# Patient Record
Sex: Female | Born: 1935
Health system: Southern US, Community
[De-identification: ages and names within clinical notes are randomized; demographics above are authoritative.]

## PROBLEM LIST (undated history)

## (undated) DIAGNOSIS — R42 Dizziness and giddiness: Secondary | ICD-10-CM

## (undated) DIAGNOSIS — I10 Essential (primary) hypertension: Secondary | ICD-10-CM

## (undated) DIAGNOSIS — I639 Cerebral infarction, unspecified: Secondary | ICD-10-CM

## (undated) HISTORY — PX: PARTIAL HYSTERECTOMY: SHX80

## (undated) HISTORY — DX: Cerebral infarction, unspecified: I63.9

---

## 2011-10-15 DIAGNOSIS — Z79899 Other long term (current) drug therapy: Secondary | ICD-10-CM | POA: Diagnosis not present

## 2011-10-15 DIAGNOSIS — I10 Essential (primary) hypertension: Secondary | ICD-10-CM | POA: Diagnosis not present

## 2011-10-22 DIAGNOSIS — K219 Gastro-esophageal reflux disease without esophagitis: Secondary | ICD-10-CM | POA: Diagnosis not present

## 2011-10-22 DIAGNOSIS — M79609 Pain in unspecified limb: Secondary | ICD-10-CM | POA: Diagnosis not present

## 2011-10-22 DIAGNOSIS — I10 Essential (primary) hypertension: Secondary | ICD-10-CM | POA: Diagnosis not present

## 2011-10-22 DIAGNOSIS — J309 Allergic rhinitis, unspecified: Secondary | ICD-10-CM | POA: Diagnosis not present

## 2011-11-14 DIAGNOSIS — K921 Melena: Secondary | ICD-10-CM | POA: Diagnosis not present

## 2011-12-24 DIAGNOSIS — M899 Disorder of bone, unspecified: Secondary | ICD-10-CM | POA: Diagnosis not present

## 2011-12-24 DIAGNOSIS — I10 Essential (primary) hypertension: Secondary | ICD-10-CM | POA: Diagnosis not present

## 2011-12-24 DIAGNOSIS — M949 Disorder of cartilage, unspecified: Secondary | ICD-10-CM | POA: Diagnosis not present

## 2012-01-16 DIAGNOSIS — K449 Diaphragmatic hernia without obstruction or gangrene: Secondary | ICD-10-CM | POA: Diagnosis not present

## 2012-01-16 DIAGNOSIS — K219 Gastro-esophageal reflux disease without esophagitis: Secondary | ICD-10-CM | POA: Diagnosis not present

## 2012-01-16 DIAGNOSIS — R195 Other fecal abnormalities: Secondary | ICD-10-CM | POA: Diagnosis not present

## 2012-01-16 DIAGNOSIS — K5730 Diverticulosis of large intestine without perforation or abscess without bleeding: Secondary | ICD-10-CM | POA: Diagnosis not present

## 2012-01-29 DIAGNOSIS — I10 Essential (primary) hypertension: Secondary | ICD-10-CM | POA: Diagnosis not present

## 2012-02-05 DIAGNOSIS — H2510 Age-related nuclear cataract, unspecified eye: Secondary | ICD-10-CM | POA: Diagnosis not present

## 2012-02-12 DIAGNOSIS — K921 Melena: Secondary | ICD-10-CM | POA: Diagnosis not present

## 2012-04-22 DIAGNOSIS — I10 Essential (primary) hypertension: Secondary | ICD-10-CM | POA: Diagnosis not present

## 2012-04-28 DIAGNOSIS — R7309 Other abnormal glucose: Secondary | ICD-10-CM | POA: Diagnosis not present

## 2012-04-28 DIAGNOSIS — I10 Essential (primary) hypertension: Secondary | ICD-10-CM | POA: Diagnosis not present

## 2012-04-28 DIAGNOSIS — M79609 Pain in unspecified limb: Secondary | ICD-10-CM | POA: Diagnosis not present

## 2012-07-01 DIAGNOSIS — I10 Essential (primary) hypertension: Secondary | ICD-10-CM | POA: Diagnosis not present

## 2012-07-27 ENCOUNTER — Emergency Department (INDEPENDENT_AMBULATORY_CARE_PROVIDER_SITE_OTHER)
Admission: EM | Admit: 2012-07-27 | Discharge: 2012-07-27 | Disposition: A | Discharge status: Home / Self Care | Payer: Medicare Other | Source: Home / Self Care | Attending: Emergency Medicine | Admitting: Emergency Medicine

## 2012-07-27 ENCOUNTER — Encounter (HOSPITAL_COMMUNITY): Payer: Self-pay | Admitting: Emergency Medicine

## 2012-07-27 DIAGNOSIS — H8110 Benign paroxysmal vertigo, unspecified ear: Secondary | ICD-10-CM

## 2012-07-27 LAB — POCT I-STAT, CHEM 8
BUN: 19 mg/dL (ref 6–23)
Chloride: 106 mEq/L (ref 96–112)
Creatinine, Ser: 0.9 mg/dL (ref 0.50–1.10)
Potassium: 3.8 mEq/L (ref 3.5–5.1)
Sodium: 145 mEq/L (ref 135–145)

## 2012-07-27 MED ORDER — MECLIZINE HCL 12.5 MG PO TABS: 12.5000 mg | ORAL_TABLET | Freq: Three times a day (TID) | ORAL | Status: DC | PRN

## 2012-07-27 NOTE — ED Notes (Signed)
Patient complains of dizziness , no other sx, started this morning

## 2012-07-27 NOTE — ED Provider Notes (Signed)
Chief Complaint  Patient presents with  . Dizziness    History of Present Illness:  The patient is a 78 year old female who presents with a history of dizziness since getting up from bed this morning. The patient states she got up out of bed suddenly and abruptly and thereafter felt dizzy and off balance. She denies any whirling vertigo or presyncope. She states her legs felt weak, wobbly, and staggering when she tried to walk. The symptoms have gotten progressively better throughout the morning and now they're only minimal. She denies any headaches, diplopia, blurred vision, difficulty hearing, ringing in ears, nasal congestion, or rhinorrhea. She hasn't had a stiff neck, shortness of breath, chest pain, palpitations, or syncope. She denies any paresthesias, difficulty with speech, swallowing, or localized muscle weakness.  Review of Systems:  Other than noted above, the patient denies any of the following symptoms: Systemic:  No fever, chills, fatigue, or weight loss. Eye:  No blurred vision, visual change or diplopia. ENT:  No ear pain, tinnitus, hearing loss, nasal congestion, or rhinorrhea. Cardiac:  No chest pain, dyspnea, palpitations or syncope. Neuro:  No headache, paresthesias, weakness, trouble with speech, coordination or ambulation.  PMFSH:  Past medical history, family history, social history, meds, and allergies were reviewed.  Physical Exam:   Vital signs:  BP 167/97  Pulse 93  Temp 98.6 F (37 C) (Oral)  Resp 16  SpO2 98% Filed Vitals:   07/27/12 0829 07/27/12 0911 07/27/12 0926 07/27/12 0927  BP: 165/89 153/79 Supine  157/89 Sitting  167/97 Standing   Pulse: 91 83 85 93  Temp: 98.6 F (37 C)     TempSrc: Oral     Resp: 16     SpO2: 98%      General:  Alert, oriented times 3, in no distress. Eye:  PERRL, full EOM, no nystagmus. ENT:  TMs and canals normal.  Nasal mucosa normal.  Pharynx clear. Neck:  No adenopathy, tenderness, or mass.  Thyroid normal.  No  carotid bruit. Lungs:  Breath sounds clear and equal bilaterally.  No wheezes, rales or rhonchi. Heart:  Regular rhythm.  No gallops, murmers, or rubs. Neuro:  Alert and oriented times 3.  Cranial nerves intact.  No pronator drift.  Finger to nose normal.  No focal weakness.  Sensation intact to light touch.  Romberg's sign negative, gait normal.  She is unable to do tandem gait because of a knee problem.  Dix-Hallpike maneuver was positive with the right ear down with subjective mild dizziness but no nystagmus.  Labs:   Results for orders placed during the hospital encounter of 07/27/12  POCT I-STAT, CHEM 8      Component Value Range   Sodium 145  135 - 145 mEq/L   Potassium 3.8  3.5 - 5.1 mEq/L   Chloride 106  96 - 112 mEq/L   BUN 19  6 - 23 mg/dL   Creatinine, Ser 9.60  0.50 - 1.10 mg/dL   Glucose, Bld 454 (*) 70 - 99 mg/dL   Calcium, Ion 0.98  1.19 - 1.30 mmol/L   TCO2 25  0 - 100 mmol/L   Hemoglobin 13.6  12.0 - 15.0 g/dL   HCT 14.7  82.9 - 56.2 %     Date: 07/27/2012  Rate: 85  Rhythm: normal sinus rhythm and premature ventricular contractions (PVC)  QRS Axis: normal  Intervals: normal  ST/T Wave abnormalities: nonspecific T wave changes  Conduction Disutrbances:none  Narrative Interpretation: Sinus rhythm with occasional premature  ventricular complexes, possible left atrial margin and, nonspecific T wave abnormalities.  Old EKG Reviewed: none available  Assessment:  The encounter diagnosis was Benign positional vertigo.  Plan:   1.  The following meds were prescribed:   New Prescriptions   MECLIZINE (ANTIVERT) 12.5 MG TABLET    Take 1 tablet (12.5 mg total) by mouth 3 (three) times daily as needed.   2.  The patient was instructed in symptomatic care and handouts were given. She was given apple he exercises to do at home and suggested she return in a week if no improvement either here or to her primary care physician. 3.  The patient was told to return if becoming worse  in any way, if no better in 3 or 4 days, and given some red flag symptoms that would indicate earlier return.    Reuben Likes, MD 07/27/12 [phone]

## 2012-08-04 DIAGNOSIS — T887XXA Unspecified adverse effect of drug or medicament, initial encounter: Secondary | ICD-10-CM | POA: Diagnosis not present

## 2012-08-04 DIAGNOSIS — I10 Essential (primary) hypertension: Secondary | ICD-10-CM | POA: Diagnosis not present

## 2012-08-21 DIAGNOSIS — M7750 Other enthesopathy of unspecified foot: Secondary | ICD-10-CM | POA: Diagnosis not present

## 2012-08-21 DIAGNOSIS — M7150 Other bursitis, not elsewhere classified, unspecified site: Secondary | ICD-10-CM | POA: Diagnosis not present

## 2012-09-15 DIAGNOSIS — M1710 Unilateral primary osteoarthritis, unspecified knee: Secondary | ICD-10-CM | POA: Diagnosis not present

## 2012-10-20 DIAGNOSIS — I10 Essential (primary) hypertension: Secondary | ICD-10-CM | POA: Diagnosis not present

## 2012-10-20 DIAGNOSIS — E739 Lactose intolerance, unspecified: Secondary | ICD-10-CM | POA: Diagnosis not present

## 2012-10-20 DIAGNOSIS — Z79899 Other long term (current) drug therapy: Secondary | ICD-10-CM | POA: Diagnosis not present

## 2012-10-27 DIAGNOSIS — K219 Gastro-esophageal reflux disease without esophagitis: Secondary | ICD-10-CM | POA: Diagnosis not present

## 2012-10-27 DIAGNOSIS — M899 Disorder of bone, unspecified: Secondary | ICD-10-CM | POA: Diagnosis not present

## 2012-10-27 DIAGNOSIS — I10 Essential (primary) hypertension: Secondary | ICD-10-CM | POA: Diagnosis not present

## 2012-10-27 DIAGNOSIS — I499 Cardiac arrhythmia, unspecified: Secondary | ICD-10-CM | POA: Diagnosis not present

## 2012-10-27 DIAGNOSIS — M949 Disorder of cartilage, unspecified: Secondary | ICD-10-CM | POA: Diagnosis not present

## 2012-10-27 DIAGNOSIS — J438 Other emphysema: Secondary | ICD-10-CM | POA: Diagnosis not present

## 2012-11-02 DIAGNOSIS — R0602 Shortness of breath: Secondary | ICD-10-CM | POA: Diagnosis not present

## 2012-11-02 DIAGNOSIS — R05 Cough: Secondary | ICD-10-CM | POA: Diagnosis not present

## 2012-11-02 DIAGNOSIS — R0989 Other specified symptoms and signs involving the circulatory and respiratory systems: Secondary | ICD-10-CM | POA: Diagnosis not present

## 2012-11-02 DIAGNOSIS — R0609 Other forms of dyspnea: Secondary | ICD-10-CM | POA: Diagnosis not present

## 2012-11-16 DIAGNOSIS — M1710 Unilateral primary osteoarthritis, unspecified knee: Secondary | ICD-10-CM | POA: Diagnosis not present

## 2013-02-09 DIAGNOSIS — I10 Essential (primary) hypertension: Secondary | ICD-10-CM | POA: Diagnosis not present

## 2013-02-25 DIAGNOSIS — H905 Unspecified sensorineural hearing loss: Secondary | ICD-10-CM | POA: Diagnosis not present

## 2013-03-09 DIAGNOSIS — J322 Chronic ethmoidal sinusitis: Secondary | ICD-10-CM | POA: Diagnosis not present

## 2013-04-20 DIAGNOSIS — I10 Essential (primary) hypertension: Secondary | ICD-10-CM | POA: Diagnosis not present

## 2013-04-27 DIAGNOSIS — I10 Essential (primary) hypertension: Secondary | ICD-10-CM | POA: Diagnosis not present

## 2013-04-27 DIAGNOSIS — K219 Gastro-esophageal reflux disease without esophagitis: Secondary | ICD-10-CM | POA: Diagnosis not present

## 2013-05-24 DIAGNOSIS — H60399 Other infective otitis externa, unspecified ear: Secondary | ICD-10-CM | POA: Diagnosis not present

## 2013-05-27 DIAGNOSIS — H60399 Other infective otitis externa, unspecified ear: Secondary | ICD-10-CM | POA: Diagnosis not present

## 2013-07-13 DIAGNOSIS — H2510 Age-related nuclear cataract, unspecified eye: Secondary | ICD-10-CM | POA: Diagnosis not present

## 2013-10-22 DIAGNOSIS — I10 Essential (primary) hypertension: Secondary | ICD-10-CM | POA: Diagnosis not present

## 2013-10-22 DIAGNOSIS — Z79899 Other long term (current) drug therapy: Secondary | ICD-10-CM | POA: Diagnosis not present

## 2013-10-29 DIAGNOSIS — I10 Essential (primary) hypertension: Secondary | ICD-10-CM | POA: Diagnosis not present

## 2013-10-29 DIAGNOSIS — E559 Vitamin D deficiency, unspecified: Secondary | ICD-10-CM | POA: Diagnosis not present

## 2013-10-29 DIAGNOSIS — I499 Cardiac arrhythmia, unspecified: Secondary | ICD-10-CM | POA: Diagnosis not present

## 2013-10-29 DIAGNOSIS — R030 Elevated blood-pressure reading, without diagnosis of hypertension: Secondary | ICD-10-CM | POA: Diagnosis not present

## 2014-03-15 DIAGNOSIS — E559 Vitamin D deficiency, unspecified: Secondary | ICD-10-CM | POA: Diagnosis not present

## 2014-03-22 DIAGNOSIS — I10 Essential (primary) hypertension: Secondary | ICD-10-CM | POA: Diagnosis not present

## 2014-05-17 DIAGNOSIS — H6120 Impacted cerumen, unspecified ear: Secondary | ICD-10-CM | POA: Diagnosis not present

## 2014-06-08 DIAGNOSIS — H7290 Unspecified perforation of tympanic membrane, unspecified ear: Secondary | ICD-10-CM | POA: Diagnosis not present

## 2014-06-08 DIAGNOSIS — J040 Acute laryngitis: Secondary | ICD-10-CM | POA: Diagnosis not present

## 2014-06-08 DIAGNOSIS — H60399 Other infective otitis externa, unspecified ear: Secondary | ICD-10-CM | POA: Diagnosis not present

## 2014-09-26 DIAGNOSIS — E559 Vitamin D deficiency, unspecified: Secondary | ICD-10-CM | POA: Diagnosis not present

## 2014-09-26 DIAGNOSIS — I10 Essential (primary) hypertension: Secondary | ICD-10-CM | POA: Diagnosis not present

## 2014-09-26 DIAGNOSIS — Z79899 Other long term (current) drug therapy: Secondary | ICD-10-CM | POA: Diagnosis not present

## 2014-10-03 DIAGNOSIS — Z79899 Other long term (current) drug therapy: Secondary | ICD-10-CM | POA: Diagnosis not present

## 2014-10-03 DIAGNOSIS — I10 Essential (primary) hypertension: Secondary | ICD-10-CM | POA: Diagnosis not present

## 2014-10-03 DIAGNOSIS — E559 Vitamin D deficiency, unspecified: Secondary | ICD-10-CM | POA: Diagnosis not present

## 2014-10-17 DIAGNOSIS — R12 Heartburn: Secondary | ICD-10-CM | POA: Diagnosis not present

## 2015-01-11 DIAGNOSIS — M1711 Unilateral primary osteoarthritis, right knee: Secondary | ICD-10-CM | POA: Diagnosis not present

## 2015-01-24 DIAGNOSIS — M1711 Unilateral primary osteoarthritis, right knee: Secondary | ICD-10-CM | POA: Diagnosis not present

## 2015-01-31 DIAGNOSIS — M1711 Unilateral primary osteoarthritis, right knee: Secondary | ICD-10-CM | POA: Diagnosis not present

## 2015-02-07 DIAGNOSIS — M1711 Unilateral primary osteoarthritis, right knee: Secondary | ICD-10-CM | POA: Diagnosis not present

## 2015-02-14 DIAGNOSIS — M1711 Unilateral primary osteoarthritis, right knee: Secondary | ICD-10-CM | POA: Diagnosis not present

## 2015-02-24 DIAGNOSIS — M1612 Unilateral primary osteoarthritis, left hip: Secondary | ICD-10-CM | POA: Diagnosis not present

## 2015-04-04 DIAGNOSIS — Z79899 Other long term (current) drug therapy: Secondary | ICD-10-CM | POA: Diagnosis not present

## 2015-04-04 DIAGNOSIS — E559 Vitamin D deficiency, unspecified: Secondary | ICD-10-CM | POA: Diagnosis not present

## 2015-04-11 DIAGNOSIS — M25569 Pain in unspecified knee: Secondary | ICD-10-CM | POA: Diagnosis not present

## 2015-04-11 DIAGNOSIS — E559 Vitamin D deficiency, unspecified: Secondary | ICD-10-CM | POA: Diagnosis not present

## 2015-04-11 DIAGNOSIS — I10 Essential (primary) hypertension: Secondary | ICD-10-CM | POA: Diagnosis not present

## 2015-06-02 DIAGNOSIS — H2513 Age-related nuclear cataract, bilateral: Secondary | ICD-10-CM | POA: Diagnosis not present

## 2015-08-08 DIAGNOSIS — E559 Vitamin D deficiency, unspecified: Secondary | ICD-10-CM | POA: Diagnosis not present

## 2015-08-15 DIAGNOSIS — M25569 Pain in unspecified knee: Secondary | ICD-10-CM | POA: Diagnosis not present

## 2015-08-15 DIAGNOSIS — E559 Vitamin D deficiency, unspecified: Secondary | ICD-10-CM | POA: Diagnosis not present

## 2015-08-15 DIAGNOSIS — I10 Essential (primary) hypertension: Secondary | ICD-10-CM | POA: Diagnosis not present

## 2015-10-18 DIAGNOSIS — Z0000 Encounter for general adult medical examination without abnormal findings: Secondary | ICD-10-CM | POA: Diagnosis not present

## 2015-10-18 DIAGNOSIS — I10 Essential (primary) hypertension: Secondary | ICD-10-CM | POA: Diagnosis not present

## 2015-10-18 DIAGNOSIS — Z79899 Other long term (current) drug therapy: Secondary | ICD-10-CM | POA: Diagnosis not present

## 2015-10-18 DIAGNOSIS — E559 Vitamin D deficiency, unspecified: Secondary | ICD-10-CM | POA: Diagnosis not present

## 2015-10-18 DIAGNOSIS — N390 Urinary tract infection, site not specified: Secondary | ICD-10-CM | POA: Diagnosis not present

## 2015-11-01 DIAGNOSIS — I10 Essential (primary) hypertension: Secondary | ICD-10-CM | POA: Diagnosis not present

## 2015-11-01 DIAGNOSIS — E559 Vitamin D deficiency, unspecified: Secondary | ICD-10-CM | POA: Diagnosis not present

## 2016-02-08 DIAGNOSIS — H2513 Age-related nuclear cataract, bilateral: Secondary | ICD-10-CM | POA: Diagnosis not present

## 2016-03-04 DIAGNOSIS — H2512 Age-related nuclear cataract, left eye: Secondary | ICD-10-CM | POA: Diagnosis not present

## 2016-03-04 DIAGNOSIS — H25811 Combined forms of age-related cataract, right eye: Secondary | ICD-10-CM | POA: Diagnosis not present

## 2016-04-23 DIAGNOSIS — I10 Essential (primary) hypertension: Secondary | ICD-10-CM | POA: Diagnosis not present

## 2016-04-23 DIAGNOSIS — M1990 Unspecified osteoarthritis, unspecified site: Secondary | ICD-10-CM | POA: Diagnosis not present

## 2016-04-23 DIAGNOSIS — R42 Dizziness and giddiness: Secondary | ICD-10-CM | POA: Diagnosis not present

## 2016-04-29 DIAGNOSIS — I10 Essential (primary) hypertension: Secondary | ICD-10-CM | POA: Diagnosis not present

## 2016-04-29 DIAGNOSIS — E559 Vitamin D deficiency, unspecified: Secondary | ICD-10-CM | POA: Diagnosis not present

## 2016-05-01 DIAGNOSIS — E559 Vitamin D deficiency, unspecified: Secondary | ICD-10-CM | POA: Diagnosis not present

## 2016-05-01 DIAGNOSIS — N3281 Overactive bladder: Secondary | ICD-10-CM | POA: Diagnosis not present

## 2016-06-15 ENCOUNTER — Encounter (HOSPITAL_COMMUNITY): Payer: Self-pay | Admitting: Emergency Medicine

## 2016-06-15 ENCOUNTER — Ambulatory Visit (HOSPITAL_COMMUNITY)
Admission: EM | Admit: 2016-06-15 | Discharge: 2016-06-15 | Disposition: A | Discharge status: Home / Self Care | Payer: Medicare Other | Attending: Family Medicine | Admitting: Family Medicine

## 2016-06-15 DIAGNOSIS — H65192 Other acute nonsuppurative otitis media, left ear: Secondary | ICD-10-CM

## 2016-06-15 DIAGNOSIS — R42 Dizziness and giddiness: Secondary | ICD-10-CM | POA: Diagnosis not present

## 2016-06-15 HISTORY — DX: Essential (primary) hypertension: I10

## 2016-06-15 MED ORDER — FLUTICASONE PROPIONATE 50 MCG/ACT NA SUSP: 2.0000 | Freq: Every day | NASAL | 2 refills | Status: DC

## 2016-06-15 NOTE — ED Notes (Signed)
Pt d/c by Jennifer O, NP  

## 2016-06-15 NOTE — ED Triage Notes (Signed)
Pt c/o intermittent dizziness since 8/14 associated w/left ear fullness and congestion  Voices no other concerns... A&O x4... NAD... Daughter at bedside.

## 2016-06-15 NOTE — ED Provider Notes (Signed)
CSN: [id number]     Arrival date & time 06/15/16  1310 History   First MD Initiated Contact with Patient 06/15/16 1508     Chief Complaint  Patient presents with  . Dizziness   (Consider location/radiation/quality/duration/timing/severity/associated sxs/prior Treatment) 82 y.o. female presents with intermittent dizziness  X several months and pressure to her left ear X 1 month. Patient states that she was diagnosed with vertigo back in July but has not taken meclizine because it will make her tired.  Condition is acute on chronic in nature. Condition is made better by nothing. Condition is made worse by laying down and with sudden movements. Patient denies any relieve from saline mist prior to there arrival at this facility. Upon review of notes patient has a diagnosis of benign postural vertigo in october 2013. No neurological deficits noted. No recent uri symptoms.       Past Medical History:  Diagnosis Date  . Hypertension    History reviewed. No pertinent surgical history. History reviewed. No pertinent family history. Social History  Substance Use Topics  . Smoking status: Never Smoker  . Smokeless tobacco: Never Used  . Alcohol use No   OB History    No data available     Review of Systems  Constitutional: Negative.   HENT: Positive for ear pain ( fullness to left ear).   Respiratory: Negative.   Neurological: Positive for dizziness.    Allergies  Review of patient's allergies indicates no known allergies.  Home Medications   Prior to Admission medications   Medication Sig Start Date End Date Taking? Authorizing Provider  amLODipine-benazepril (LOTREL) 5-40 MG per capsule Take 1 capsule by mouth daily.   Yes Historical Provider, MD  metoprolol succinate (TOPROL-XL) 50 MG 24 hr tablet Take 50 mg by mouth daily. Take with or immediately following a meal.   Yes Historical Provider, MD  calcium carbonate (OS-CAL) 600 MG TABS Take 600 mg by mouth daily.    Historical  Provider, MD  fluticasone (FLONASE) 50 MCG/ACT nasal spray Place 2 sprays into both nostrils daily. 06/15/16   Alene Mires, NP  Loratadine (CLARITIN PO) Take by mouth as needed.    Historical Provider, MD  meclizine (ANTIVERT) 12.5 MG tablet Take 1 tablet (12.5 mg total) by mouth 3 (three) times daily as needed. 07/27/12   Reuben Likes, MD   Meds Ordered and Administered this Visit  Medications - No data to display  BP 173/68 (BP Location: Left Arm)   Pulse 74   Temp 97.7 F (36.5 C) (Oral)   Resp 14   SpO2 99%  No data found.   Physical Exam  Constitutional: She is oriented to person, place, and time. She appears well-developed and well-nourished.  HENT:  Right Ear: External ear normal.  Left Ear: External ear normal.  Nose: Nose normal.  Cardiovascular: Normal rate and regular rhythm.   Pulmonary/Chest: Effort normal and breath sounds normal.  Neurological: She is alert and oriented to person, place, and time.  Skin: Skin is warm and dry.    Urgent Care Course   Clinical Course    Procedures (including critical care time)  Labs Review Labs Reviewed - No data to display  Imaging Review No results found.   Visual Acuity Review  Right Eye Distance:   Left Eye Distance:   Bilateral Distance:    Right Eye Near:   Left Eye Near:    Bilateral Near:         MDM  1. Vertigo   2. Acute effusion of left ear        Jacqualine Mau, NP 06/15/16 1516

## 2016-06-15 NOTE — Discharge Instructions (Signed)
Take vertigo as prescribed

## 2016-08-28 ENCOUNTER — Ambulatory Visit (INDEPENDENT_AMBULATORY_CARE_PROVIDER_SITE_OTHER): Payer: Medicare Other

## 2016-08-28 ENCOUNTER — Ambulatory Visit (INDEPENDENT_AMBULATORY_CARE_PROVIDER_SITE_OTHER): Payer: Medicare Other | Admitting: Podiatry

## 2016-08-28 ENCOUNTER — Encounter: Payer: Self-pay | Admitting: Podiatry

## 2016-08-28 DIAGNOSIS — M722 Plantar fascial fibromatosis: Secondary | ICD-10-CM | POA: Diagnosis not present

## 2016-08-28 DIAGNOSIS — M79671 Pain in right foot: Secondary | ICD-10-CM | POA: Diagnosis not present

## 2016-08-28 DIAGNOSIS — M21619 Bunion of unspecified foot: Secondary | ICD-10-CM

## 2016-08-28 MED ORDER — TRIAMCINOLONE ACETONIDE 10 MG/ML IJ SUSP
10.0000 mg | Freq: Once | INTRAMUSCULAR | Status: AC
  Administered 2016-08-28: 10 mg

## 2016-08-28 NOTE — Progress Notes (Signed)
   Subjective:    Patient ID: Danielle Vaughan, female    DOB: [birth year], 82 y.o.   MRN: [id number]  HPI Chief Complaint  Patient presents with  . Foot Pain    Right foot; medial (arch area); x2 weeks      Review of Systems  All other systems reviewed and are negative.      Objective:   Physical Exam        Assessment & Plan:

## 2016-08-29 NOTE — Progress Notes (Signed)
Subjective:     Patient ID: Danielle Vaughan, female   DOB: 23-Nov-1932, 82 y.o.   MRN: [id number]  HPI patient presents right foot stating that it's been hurting a lot and the arch and she does not remember specific injury and has been present around a month   Review of Systems  All other systems reviewed and are negative.      Objective:   Physical Exam  Constitutional: She is oriented to person, place, and time.  Cardiovascular: Intact distal pulses.   Musculoskeletal: Normal range of motion.  Neurological: She is oriented to person, place, and time.  Skin: Skin is warm.  Nursing note and vitals reviewed.  neurovascular status intact muscle strength is adequate range of motion within normal limits with patient's right arch being quite sore in the mid arch area with inflammation fluid buildup noted. Patient is moderate depression of the arch but no indication of tendon injury or other pathology. Found to have good digital perfusion and well oriented 3     Assessment:     Inflammatory fasciitis mid arch area right    Plan:     H&P condition reviewed and at this time reviewed x-rays. I did careful injection of the mid arch area right 3 mg Kenalog 5 mill grams Xylocaine and applied fascial brace to lift the arch. Reappoint to recheck again in the next several weeks  X-ray report indicates moderate depression of the arch with no indication of fracture or calcification

## 2016-10-29 DIAGNOSIS — Z0000 Encounter for general adult medical examination without abnormal findings: Secondary | ICD-10-CM | POA: Diagnosis not present

## 2016-10-29 DIAGNOSIS — Z13220 Encounter for screening for lipoid disorders: Secondary | ICD-10-CM | POA: Diagnosis not present

## 2016-10-29 DIAGNOSIS — E559 Vitamin D deficiency, unspecified: Secondary | ICD-10-CM | POA: Diagnosis not present

## 2016-10-29 DIAGNOSIS — I10 Essential (primary) hypertension: Secondary | ICD-10-CM | POA: Diagnosis not present

## 2016-11-05 DIAGNOSIS — I10 Essential (primary) hypertension: Secondary | ICD-10-CM | POA: Diagnosis not present

## 2016-11-05 DIAGNOSIS — Z79899 Other long term (current) drug therapy: Secondary | ICD-10-CM | POA: Diagnosis not present

## 2016-11-05 DIAGNOSIS — I517 Cardiomegaly: Secondary | ICD-10-CM | POA: Diagnosis not present

## 2016-11-05 DIAGNOSIS — E559 Vitamin D deficiency, unspecified: Secondary | ICD-10-CM | POA: Diagnosis not present

## 2016-11-14 DIAGNOSIS — H35362 Drusen (degenerative) of macula, left eye: Secondary | ICD-10-CM | POA: Diagnosis not present

## 2016-11-14 DIAGNOSIS — H35032 Hypertensive retinopathy, left eye: Secondary | ICD-10-CM | POA: Diagnosis not present

## 2016-11-14 DIAGNOSIS — H2512 Age-related nuclear cataract, left eye: Secondary | ICD-10-CM | POA: Diagnosis not present

## 2016-11-14 DIAGNOSIS — H25013 Cortical age-related cataract, bilateral: Secondary | ICD-10-CM | POA: Diagnosis not present

## 2016-11-14 DIAGNOSIS — H2513 Age-related nuclear cataract, bilateral: Secondary | ICD-10-CM | POA: Diagnosis not present

## 2016-12-03 DIAGNOSIS — H2512 Age-related nuclear cataract, left eye: Secondary | ICD-10-CM | POA: Diagnosis not present

## 2016-12-03 DIAGNOSIS — H25812 Combined forms of age-related cataract, left eye: Secondary | ICD-10-CM | POA: Diagnosis not present

## 2016-12-30 DIAGNOSIS — H2511 Age-related nuclear cataract, right eye: Secondary | ICD-10-CM | POA: Diagnosis not present

## 2016-12-30 DIAGNOSIS — H25011 Cortical age-related cataract, right eye: Secondary | ICD-10-CM | POA: Diagnosis not present

## 2016-12-30 DIAGNOSIS — H2589 Other age-related cataract: Secondary | ICD-10-CM | POA: Diagnosis not present

## 2017-01-07 DIAGNOSIS — H2511 Age-related nuclear cataract, right eye: Secondary | ICD-10-CM | POA: Diagnosis not present

## 2017-01-07 DIAGNOSIS — H25811 Combined forms of age-related cataract, right eye: Secondary | ICD-10-CM | POA: Diagnosis not present

## 2017-01-07 DIAGNOSIS — H2521 Age-related cataract, morgagnian type, right eye: Secondary | ICD-10-CM | POA: Diagnosis not present

## 2017-01-08 DIAGNOSIS — I10 Essential (primary) hypertension: Secondary | ICD-10-CM | POA: Diagnosis not present

## 2017-01-16 DIAGNOSIS — I10 Essential (primary) hypertension: Secondary | ICD-10-CM | POA: Diagnosis not present

## 2017-05-07 DIAGNOSIS — I10 Essential (primary) hypertension: Secondary | ICD-10-CM | POA: Diagnosis not present

## 2017-05-07 DIAGNOSIS — R5383 Other fatigue: Secondary | ICD-10-CM | POA: Diagnosis not present

## 2017-06-07 ENCOUNTER — Emergency Department (HOSPITAL_COMMUNITY)
Admission: EM | Admit: 2017-06-07 | Discharge: 2017-06-07 | Disposition: A | Discharge status: Home / Self Care | Payer: Medicare Other | Attending: Emergency Medicine | Admitting: Emergency Medicine

## 2017-06-07 ENCOUNTER — Encounter (HOSPITAL_COMMUNITY): Payer: Self-pay

## 2017-06-07 DIAGNOSIS — I10 Essential (primary) hypertension: Secondary | ICD-10-CM | POA: Insufficient documentation

## 2017-06-07 DIAGNOSIS — R55 Syncope and collapse: Secondary | ICD-10-CM | POA: Insufficient documentation

## 2017-06-07 DIAGNOSIS — Z79899 Other long term (current) drug therapy: Secondary | ICD-10-CM | POA: Insufficient documentation

## 2017-06-07 DIAGNOSIS — R000 Tachycardia, unspecified: Secondary | ICD-10-CM | POA: Diagnosis not present

## 2017-06-07 LAB — CBC
HCT: 35.5 % — ABNORMAL LOW (ref 36.0–46.0)
Hemoglobin: 12.1 g/dL (ref 12.0–15.0)
MCH: 30.9 pg (ref 26.0–34.0)
MCHC: 34.1 g/dL (ref 30.0–36.0)
MCV: 90.6 fL (ref 78.0–100.0)
Platelets: 373 10*3/uL (ref 150–400)
RBC: 3.92 MIL/uL (ref 3.87–5.11)
RDW: 17.7 % — ABNORMAL HIGH (ref 11.5–15.5)
WBC: 7 10*3/uL (ref 4.0–10.5)

## 2017-06-07 LAB — BASIC METABOLIC PANEL
Anion gap: 12 (ref 5–15)
BUN: 18 mg/dL (ref 6–20)
CO2: 21 mmol/L — ABNORMAL LOW (ref 22–32)
Calcium: 8.9 mg/dL (ref 8.9–10.3)
Chloride: 107 mmol/L (ref 101–111)
Creatinine, Ser: 0.94 mg/dL (ref 0.44–1.00)
GFR calc Af Amer: >60 mL/min (ref >60)
GFR calc non Af Amer: 55 mL/min — ABNORMAL LOW (ref >60)
Glucose, Bld: 97 mg/dL (ref 65–99)
Potassium: 3.4 mmol/L — ABNORMAL LOW (ref 3.5–5.1)
Sodium: 140 mmol/L (ref 135–145)

## 2017-06-07 LAB — CBG MONITORING, ED: Glucose-Capillary: 96 mg/dL (ref 65–99)

## 2017-06-07 NOTE — ED Notes (Signed)
Pt CBG was 96, notified Carla(RN)

## 2017-06-07 NOTE — ED Provider Notes (Signed)
[facility] DEPT Provider Note   CSN: [id number] Arrival date & time: 06/07/17  1351     History   Chief Complaint Chief Complaint  Patient presents with  . Loss of Consciousness    HPI Danielle Vaughan is a 83 y.o. female who presents with a syncopal episode. PMH significant for HTN. She states that she was in her usual state of health today and was getting her hair cut by a friend when she lost consciousness. The friend states that the patient suddenly became unresponsive and her eyes rolled to the back of her head. She was unresponsive for about 10 minutes. When she regained consciousness she was slow to respond but not confused and able to answer EMS questions. She denies feeling lightheaded throughout the day but endorses decreased PO intake today because she was busy cleaning. She denies recent fever or illness, headache, chest pain, palpitations, leg swelling, SOB, cough, abdominal pain, N/V/D, dysuria, unilateral weakness. She denies a cardiac hx and has not had any stress testing, echo, cath. No tongue biting or bladder incontinence.  HPI  Past Medical History:  Diagnosis Date  . Hypertension     There are no active problems to display for this patient.   History reviewed. No pertinent surgical history.  OB History    No data available       Home Medications    Prior to Admission medications   Medication Sig Start Date End Date Taking? Authorizing Provider  amLODipine (NORVASC) 5 MG tablet Take 5 mg by mouth at bedtime.    Yes [provider]  benazepril (LOTENSIN) 40 MG tablet Take 40 mg by mouth at bedtime.    Yes [provider]  Cholecalciferol (VITAMIN D3 PO) Take 1,000 Units by mouth every Sunday.    Yes [provider]  metoprolol succinate (TOPROL-XL) 50 MG 24 hr tablet Take 50 mg by mouth daily. Take with or immediately following a meal.   Yes [provider]  fluticasone (FLONASE) 50 MCG/ACT nasal spray Place 2  sprays into both nostrils daily. Patient not taking: Reported on 06/07/2017 06/15/16   Jacqualine Mau, NP  meclizine (ANTIVERT) 12.5 MG tablet Take 1 tablet (12.5 mg total) by mouth 3 (three) times daily as needed. Patient taking differently: Take 12.5 mg by mouth 3 (three) times daily as needed for dizziness.  07/27/12   Harden Mo, MD    Family History No family history on file.  Social History Social History  Substance Use Topics  . Smoking status: Never Smoker  . Smokeless tobacco: Never Used  . Alcohol use No     Allergies   Ceftin [cefuroxime axetil] and Codeine   Review of Systems Review of Systems  Constitutional: Negative for chills and fever.  Respiratory: Negative for cough and shortness of breath.   Cardiovascular: Negative for chest pain.  Gastrointestinal: Negative for abdominal pain, nausea and vomiting.  Genitourinary: Negative for dysuria.  Musculoskeletal: Positive for arthralgias (chronic knee pain).  Skin: Negative for wound.  Neurological: Positive for syncope. Negative for dizziness, seizures, weakness, light-headedness and headaches.  All other systems reviewed and are negative.    Physical Exam Updated Vital Signs BP 137/76 (BP Location: Right Arm)   Pulse 84   Temp 97.6 F (36.4 C) (Oral)   Resp 12   SpO2 99%   Physical Exam  Constitutional: She is oriented to person, place, and time. She appears well-developed and well-nourished. No distress.  Pleasant elderly female in NAD  HENT:  Head: Normocephalic and atraumatic.  Eyes: Pupils are equal, round, and reactive to light. Conjunctivae are normal. Right eye exhibits no discharge. Left eye exhibits no discharge. No scleral icterus.  Corneal arcus bilatarally  Neck: Normal range of motion.  Cardiovascular: Normal rate and regular rhythm.  Exam reveals no gallop and no friction rub.   No murmur heard. Pulmonary/Chest: Effort normal and breath sounds normal. No respiratory distress.  She has no wheezes. She has no rales. She exhibits no tenderness.  Abdominal: Soft. Bowel sounds are normal. She exhibits no distension and no mass. There is no tenderness. There is no rebound and no guarding.  Musculoskeletal:  No leg edema  Neurological: She is alert and oriented to person, place, and time.  Skin: Skin is warm and dry.  Psychiatric: She has a normal mood and affect. Her behavior is normal.  Nursing note and vitals reviewed.    ED Treatments / Results  Labs (all labs ordered are listed, but only abnormal results are displayed) Labs Reviewed  BASIC METABOLIC PANEL - Abnormal; Notable for the following:       Result Value   Potassium 3.4 (*)    CO2 21 (*)    GFR calc non Af Amer 55 (*)    All other components within normal limits  CBC - Abnormal; Notable for the following:    HCT 35.5 (*)    RDW 17.7 (*)    All other components within normal limits  CBG MONITORING, ED    EKG  EKG Interpretation  Date/Time:  Saturday June 07 2017 14:03:47 EDT Ventricular Rate:  85 PR Interval:    QRS Duration: 92 QT Interval:  401 QTC Calculation: 477 R Axis:   14 Text Interpretation:  Sinus rhythm Confirmed by Virgel Manifold [phone]) on 06/07/2017 3:09:16 PM       Radiology No results found.  Procedures Procedures (including critical care time)  Medications Ordered in ED Medications - No data to display   Initial Impression / Assessment and Plan / ED Course  I have reviewed the triage vital signs and the nursing notes.  Pertinent labs & imaging results that were available during my care of the patient were reviewed by me and considered in my medical decision making (see chart for details).  83 year old female with syncopal episode. Unclear etiology. She is mildly hypertensive but otherwise vitals are normal. EKG is NSR. Labs are overall unremarkable. She feels back to baseline. Shared decision making was made with patient and Dr. Wilson Singer. She feels comfortable  going home and following closely with her doctor. She was advised to drink more fluids. Return precautions given.  Final Clinical Impressions(s) / ED Diagnoses   Final diagnoses:  Syncope, unspecified syncope type    New Prescriptions New Prescriptions   No medications on file     Iris Pert 06/08/17 6283    Virgel Manifold, MD 06/24/17 1451

## 2017-06-07 NOTE — ED Triage Notes (Signed)
Patient arrived by [facility] following witnessed syncopal event while sitting in chair getting hair trimmed. Patient denies pain, alert and oriented on arrival. States that she has eaten and drank very little this am. IV established pta.

## 2017-06-07 NOTE — Discharge Instructions (Signed)
Please stay well hydrated and follow up with your doctor Return for worsening symptoms

## 2017-08-06 DIAGNOSIS — H35363 Drusen (degenerative) of macula, bilateral: Secondary | ICD-10-CM | POA: Diagnosis not present

## 2017-08-06 DIAGNOSIS — H35033 Hypertensive retinopathy, bilateral: Secondary | ICD-10-CM | POA: Diagnosis not present

## 2017-08-06 DIAGNOSIS — H353132 Nonexudative age-related macular degeneration, bilateral, intermediate dry stage: Secondary | ICD-10-CM | POA: Diagnosis not present

## 2017-09-29 ENCOUNTER — Encounter (HOSPITAL_COMMUNITY): Payer: Self-pay | Admitting: Family Medicine

## 2017-09-29 ENCOUNTER — Ambulatory Visit (HOSPITAL_COMMUNITY)
Admission: EM | Admit: 2017-09-29 | Discharge: 2017-09-29 | Disposition: A | Discharge status: Home / Self Care | Payer: Medicare Other | Attending: Emergency Medicine | Admitting: Emergency Medicine

## 2017-09-29 DIAGNOSIS — R42 Dizziness and giddiness: Secondary | ICD-10-CM

## 2017-09-29 MED ORDER — LORATADINE 10 MG PO TABS: 10.0000 mg | ORAL_TABLET | Freq: Every day | ORAL | 0 refills | Status: DC

## 2017-09-29 MED ORDER — MECLIZINE HCL 12.5 MG PO TABS: 12.5000 mg | ORAL_TABLET | Freq: Three times a day (TID) | ORAL | 0 refills | Status: AC | PRN

## 2017-09-29 NOTE — ED Provider Notes (Signed)
[facility]    CSN: [id number] Arrival date & time: 09/29/17  1732     History   Chief Complaint Chief Complaint  Patient presents with  . Dizziness    HPI Danielle Vaughan is a 83 y.o. female presenting with 2 episodes of dizziness starting today. Earlier today she turned toward the left in bed and began to feel dizziness. States she felt like she was spinning, denies feelings of passing out. A little later she felt the same dizziness when she bent over quickly. Endorses mild congestion and occasional left ear stuffiness. Denies any preceding illness, no syncope, no headache, vision changes, no weakness, no slurred speech. States she has had vertigo in the past.   HPI  Past Medical History:  Diagnosis Date  . Hypertension     There are no active problems to display for this patient.   History reviewed. No pertinent surgical history.  OB History    No data available       Home Medications    Prior to Admission medications   Medication Sig Start Date End Date Taking? Authorizing Provider  amLODipine (NORVASC) 5 MG tablet Take 5 mg by mouth at bedtime.     [provider]  benazepril (LOTENSIN) 40 MG tablet Take 40 mg by mouth at bedtime.     [provider]  Cholecalciferol (VITAMIN D3 PO) Take 1,000 Units by mouth every Sunday.     [provider]  fluticasone (FLONASE) 50 MCG/ACT nasal spray Place 2 sprays into both nostrils daily. Patient not taking: Reported on 06/07/2017 06/15/16   Jacqualine Mau, NP  loratadine (CLARITIN) 10 MG tablet Take 1 tablet (10 mg total) by mouth daily for 14 days. 09/29/17 10/13/17  Wieters, Hallie C, PA-C  meclizine (ANTIVERT) 12.5 MG tablet Take 1 tablet (12.5 mg total) by mouth 3 (three) times daily as needed for up to 7 days for dizziness. 09/29/17 10/06/17  Wieters, Hallie C, PA-C  metoprolol succinate (TOPROL-XL) 50 MG 24 hr tablet Take 50 mg by mouth daily. Take with or immediately following  a meal.    [provider]    Family History History reviewed. No pertinent family history.  Social History Social History   Tobacco Use  . Smoking status: Never Smoker  . Smokeless tobacco: Never Used  Substance Use Topics  . Alcohol use: No  . Drug use: Not on file     Allergies   Ceftin [cefuroxime axetil] and Codeine   Review of Systems Review of Systems  Constitutional: Negative for chills, fatigue and fever.  HENT: Negative for trouble swallowing.   Eyes: Negative for visual disturbance.  Gastrointestinal: Negative for abdominal pain, nausea and vomiting.  Musculoskeletal: Negative for back pain and neck pain.  Skin: Negative for rash.  Neurological: Positive for dizziness. Negative for syncope, speech difficulty, weakness, light-headedness, numbness and headaches.  Psychiatric/Behavioral: Negative for confusion.     Physical Exam Triage Vital Signs ED Triage Vitals [09/29/17 1813]  Enc Vitals Group     BP (!) 155/80     Pulse Rate 81     Resp 18     Temp 98.2 F (36.8 C)     Temp src      SpO2 99 %     Weight      Height      Head Circumference      Peak Flow      Pain Score      Pain Loc  Pain Edu?      Excl. in [place]?    No data found.  Updated Vital Signs BP (!) 155/80 (BP Location: Left Arm)   Pulse 81   Temp 98.2 F (36.8 C)   Resp 18   SpO2 99%    Physical Exam  Constitutional: She is oriented to person, place, and time. She appears well-developed and well-nourished. No distress.  HENT:  Head: Normocephalic and atraumatic.  Eyes: Conjunctivae are normal.  Neck: Neck supple.  Cardiovascular: Normal rate and regular rhythm.  No murmur heard. Pulmonary/Chest: Effort normal and breath sounds normal. No respiratory distress.  Abdominal: Soft. There is no tenderness.  Musculoskeletal: She exhibits no edema.  Neurological: She is alert and oriented to person, place, and time. No cranial nerve deficit or sensory deficit.  Coordination normal.  Shoulder, hip and knee strength 5/5 bilaterally  Skin: Skin is warm and dry.  Psychiatric: She has a normal mood and affect.  Nursing note and vitals reviewed.    UC Treatments / Results  Labs (all labs ordered are listed, but only abnormal results are displayed) Labs Reviewed - No data to display  EKG  EKG Interpretation None       Radiology No results found.  Procedures Procedures (including critical care time)  Medications Ordered in UC Medications - No data to display   Initial Impression / Assessment and Plan / UC Course  I have reviewed the triage vital signs and the nursing notes.  Pertinent labs & imaging results that were available during my care of the patient were reviewed by me and considered in my medical decision making (see chart for details).    Dizziness appears to be BPPV, no neuro deficits, no headache, no preceding illness- unlikely stroke, labyrinthitis, vestibular neuritis. Given meclizine PRN for dizziness, Claritin for congestion.    Final Clinical Impressions(s) / UC Diagnoses   Final diagnoses:  Vertigo    ED Discharge Orders        Ordered    meclizine (ANTIVERT) 12.5 MG tablet  3 times daily PRN     09/29/17 1927    loratadine (CLARITIN) 10 MG tablet  Daily     09/29/17 1927       Controlled Substance Prescriptions [place] Controlled Substance Registry consulted? Not Applicable   Janith Lima, PA-C 09/29/17 1944    Janith Lima, [place] 09/29/17 1945

## 2017-09-29 NOTE — Discharge Instructions (Signed)
Dizziness is likely vertigo. Please take meclizine as needed for dizziness.  Take Claritin daily for congestion.  Please return if you experience headache, slurred speech, one sided weakness, vision changes.

## 2017-09-29 NOTE — ED Triage Notes (Signed)
Pt here for episode of dizziness this am while turning over in bed. sts dizziness when bending over to pick something. sts some congestion.  Denies ear pain but feel stuffy.

## 2017-10-01 DIAGNOSIS — R42 Dizziness and giddiness: Secondary | ICD-10-CM | POA: Diagnosis not present

## 2017-11-05 DIAGNOSIS — I10 Essential (primary) hypertension: Secondary | ICD-10-CM | POA: Diagnosis not present

## 2017-11-05 DIAGNOSIS — N390 Urinary tract infection, site not specified: Secondary | ICD-10-CM | POA: Diagnosis not present

## 2017-11-05 DIAGNOSIS — R5383 Other fatigue: Secondary | ICD-10-CM | POA: Diagnosis not present

## 2017-11-05 DIAGNOSIS — Z0000 Encounter for general adult medical examination without abnormal findings: Secondary | ICD-10-CM | POA: Diagnosis not present

## 2017-11-12 DIAGNOSIS — D473 Essential (hemorrhagic) thrombocythemia: Secondary | ICD-10-CM | POA: Diagnosis not present

## 2017-11-12 DIAGNOSIS — I10 Essential (primary) hypertension: Secondary | ICD-10-CM | POA: Diagnosis not present

## 2017-11-27 DIAGNOSIS — J069 Acute upper respiratory infection, unspecified: Secondary | ICD-10-CM | POA: Diagnosis not present

## 2017-11-27 DIAGNOSIS — J3489 Other specified disorders of nose and nasal sinuses: Secondary | ICD-10-CM | POA: Diagnosis not present

## 2018-02-16 ENCOUNTER — Encounter: Payer: Self-pay | Admitting: Podiatry

## 2018-02-16 ENCOUNTER — Ambulatory Visit (INDEPENDENT_AMBULATORY_CARE_PROVIDER_SITE_OTHER): Payer: Medicare Other | Admitting: Podiatry

## 2018-02-16 ENCOUNTER — Ambulatory Visit (INDEPENDENT_AMBULATORY_CARE_PROVIDER_SITE_OTHER): Payer: Medicare Other

## 2018-02-16 DIAGNOSIS — M722 Plantar fascial fibromatosis: Secondary | ICD-10-CM | POA: Diagnosis not present

## 2018-02-16 DIAGNOSIS — M779 Enthesopathy, unspecified: Secondary | ICD-10-CM

## 2018-02-16 MED ORDER — TRIAMCINOLONE ACETONIDE 10 MG/ML IJ SUSP
10.0000 mg | Freq: Once | INTRAMUSCULAR | Status: AC
  Administered 2018-02-16: 10 mg

## 2018-02-18 NOTE — Progress Notes (Signed)
Subjective:   Patient ID: Danielle Vaughan, female   DOB: 84 y.o.   MRN: [id number]   HPI Patient presents stating that overall she is been okay but her left arch has started to hurt her and it is making it hard to walk and she does not remember specific injury   ROS      Objective:  Physical Exam  Moderate plantar fascial symptomatology of the left mid arch area localized in nature with no other pathology except for moderate flatfoot deformity     Assessment:  Acute plantar fasciitis left with moderate flatfoot deformity     Plan:  H&P x-ray reviewed and today I did a careful mid arch injection 3 mg Kenalog 5 mg Xylocaine advised on supportive shoes physical therapy and patient will be seen back if symptoms persist  X-rays indicate no signs of calcification or aggressive arthritis occurring

## 2018-04-03 DIAGNOSIS — M179 Osteoarthritis of knee, unspecified: Secondary | ICD-10-CM | POA: Diagnosis not present

## 2018-04-03 DIAGNOSIS — M25561 Pain in right knee: Secondary | ICD-10-CM | POA: Diagnosis not present

## 2018-04-03 DIAGNOSIS — R600 Localized edema: Secondary | ICD-10-CM | POA: Diagnosis not present

## 2018-04-13 DIAGNOSIS — M1711 Unilateral primary osteoarthritis, right knee: Secondary | ICD-10-CM | POA: Diagnosis not present

## 2018-05-06 ENCOUNTER — Telehealth: Payer: Self-pay | Admitting: Podiatry

## 2018-05-06 NOTE — Telephone Encounter (Signed)
Patient wants to know what she can take for the pain she has in her foot until she can be seen.

## 2018-05-06 NOTE — Telephone Encounter (Signed)
I told pt that if she tolerated she could take OTC Ibuprofen as the package directs, pt states she has high blood pressure. I told her not to take the ibuprofen, but she could ice 3-4 times daily for 15 minutes, protect her skin from the ice with a light towel, perform through the day until seen. Pt asked if she could take arthritis strength tylenol and I told her if she was already taking, then she asked if she could take regular strength tylenol and I told her that would be fine.

## 2018-05-07 ENCOUNTER — Ambulatory Visit (INDEPENDENT_AMBULATORY_CARE_PROVIDER_SITE_OTHER): Payer: Medicare Other

## 2018-05-07 ENCOUNTER — Encounter: Payer: Self-pay | Admitting: Podiatry

## 2018-05-07 ENCOUNTER — Ambulatory Visit (INDEPENDENT_AMBULATORY_CARE_PROVIDER_SITE_OTHER): Payer: Medicare Other | Admitting: Podiatry

## 2018-05-07 ENCOUNTER — Other Ambulatory Visit: Payer: Self-pay | Admitting: Podiatry

## 2018-05-07 DIAGNOSIS — M722 Plantar fascial fibromatosis: Secondary | ICD-10-CM | POA: Diagnosis not present

## 2018-05-07 DIAGNOSIS — M79672 Pain in left foot: Secondary | ICD-10-CM | POA: Diagnosis not present

## 2018-05-07 DIAGNOSIS — M1000 Idiopathic gout, unspecified site: Secondary | ICD-10-CM

## 2018-05-07 MED ORDER — TRIAMCINOLONE ACETONIDE 10 MG/ML IJ SUSP: 10.0000 mg | Freq: Once | INTRAMUSCULAR | Status: DC

## 2018-05-07 NOTE — Patient Instructions (Signed)

## 2018-05-09 NOTE — Progress Notes (Signed)
Subjective:   Patient ID: Danielle Vaughan, female   DOB: 84 y.o.   MRN: [id number]   HPI Patient is developed a reoccurrence of inflammation on the medial side of the left without specific injury and states that it just turned red and has gotten very sore   ROS      Objective:  Physical Exam  Neurovascular status unchanged with inflammation around the medial arch left and into the navicular insertion.  It is quite inflamed when palpated and makes walking difficult and patient has no history of this kind of problem prior     Assessment:  Fasciitis or possible gout-like condition along with tendinitis     Plan:  H&P condition reviewed and I recommended careful injection which was administered 3 mg Kenalog 5 mg Xylocaine and I gave her all information on gout and if this were to recur we will get blood work but at this time she will modify her diet.  Reappoint to recheck as needed  X-ray was negative for signs of fracture or acute arthritis associated with this condition

## 2018-06-10 DIAGNOSIS — I10 Essential (primary) hypertension: Secondary | ICD-10-CM | POA: Diagnosis not present

## 2018-06-10 DIAGNOSIS — D473 Essential (hemorrhagic) thrombocythemia: Secondary | ICD-10-CM | POA: Diagnosis not present

## 2018-06-17 DIAGNOSIS — D473 Essential (hemorrhagic) thrombocythemia: Secondary | ICD-10-CM | POA: Diagnosis not present

## 2018-06-17 DIAGNOSIS — R5383 Other fatigue: Secondary | ICD-10-CM | POA: Diagnosis not present

## 2018-06-17 DIAGNOSIS — I10 Essential (primary) hypertension: Secondary | ICD-10-CM | POA: Diagnosis not present

## 2018-09-10 DIAGNOSIS — H35033 Hypertensive retinopathy, bilateral: Secondary | ICD-10-CM | POA: Diagnosis not present

## 2018-09-10 DIAGNOSIS — H353132 Nonexudative age-related macular degeneration, bilateral, intermediate dry stage: Secondary | ICD-10-CM | POA: Diagnosis not present

## 2018-09-10 DIAGNOSIS — H3509 Other intraretinal microvascular abnormalities: Secondary | ICD-10-CM | POA: Diagnosis not present

## 2018-09-10 DIAGNOSIS — H04123 Dry eye syndrome of bilateral lacrimal glands: Secondary | ICD-10-CM | POA: Diagnosis not present

## 2018-10-23 ENCOUNTER — Encounter: Payer: Self-pay | Admitting: Podiatry

## 2018-10-23 ENCOUNTER — Ambulatory Visit (INDEPENDENT_AMBULATORY_CARE_PROVIDER_SITE_OTHER): Payer: Medicare Other | Admitting: Podiatry

## 2018-10-23 ENCOUNTER — Ambulatory Visit: Payer: Medicare Other | Admitting: Podiatry

## 2018-10-23 DIAGNOSIS — M76822 Posterior tibial tendinitis, left leg: Secondary | ICD-10-CM | POA: Diagnosis not present

## 2018-10-23 MED ORDER — TRIAMCINOLONE ACETONIDE 10 MG/ML IJ SUSP
10.0000 mg | Freq: Once | INTRAMUSCULAR | Status: AC
  Administered 2018-10-23: 10 mg

## 2018-10-29 NOTE — Progress Notes (Signed)
Subjective:   Patient ID: Danielle Vaughan, female   DOB: 85 y.o.   MRN: [id number]   HPI Patient presents stating that the inside of the left ankle has been sore again and she did very well for a while but it is flared up recently   ROS      Objective:  Physical Exam  Neurovascular status intact with inflammation of the posterior tibial tendon left as it comes underneath the medial malleolus with no indication of tendon dysfunction or swelling     Assessment:  Posterior tibial tendon inflammation fluid buildup     Plan:  H&P condition reviewed and recommended sheath injection of this area and explained risk of this.  Today I injected 3 mg Dexasone Kenalog 5 mg Xylocaine advised on support and will be seen back as symptoms indicate

## 2018-11-25 DIAGNOSIS — D473 Essential (hemorrhagic) thrombocythemia: Secondary | ICD-10-CM | POA: Diagnosis not present

## 2018-11-25 DIAGNOSIS — R5383 Other fatigue: Secondary | ICD-10-CM | POA: Diagnosis not present

## 2018-11-25 DIAGNOSIS — Z0000 Encounter for general adult medical examination without abnormal findings: Secondary | ICD-10-CM | POA: Diagnosis not present

## 2018-11-25 DIAGNOSIS — I10 Essential (primary) hypertension: Secondary | ICD-10-CM | POA: Diagnosis not present

## 2018-12-02 DIAGNOSIS — R5383 Other fatigue: Secondary | ICD-10-CM | POA: Diagnosis not present

## 2018-12-02 DIAGNOSIS — I10 Essential (primary) hypertension: Secondary | ICD-10-CM | POA: Diagnosis not present

## 2018-12-02 DIAGNOSIS — D473 Essential (hemorrhagic) thrombocythemia: Secondary | ICD-10-CM | POA: Diagnosis not present

## 2019-04-30 ENCOUNTER — Other Ambulatory Visit: Payer: Self-pay

## 2019-05-19 DIAGNOSIS — L282 Other prurigo: Secondary | ICD-10-CM | POA: Diagnosis not present

## 2019-05-19 DIAGNOSIS — L245 Irritant contact dermatitis due to other chemical products: Secondary | ICD-10-CM | POA: Diagnosis not present

## 2019-06-23 DIAGNOSIS — R5383 Other fatigue: Secondary | ICD-10-CM | POA: Diagnosis not present

## 2019-06-23 DIAGNOSIS — D473 Essential (hemorrhagic) thrombocythemia: Secondary | ICD-10-CM | POA: Diagnosis not present

## 2019-06-23 DIAGNOSIS — I10 Essential (primary) hypertension: Secondary | ICD-10-CM | POA: Diagnosis not present

## 2019-06-30 DIAGNOSIS — R5383 Other fatigue: Secondary | ICD-10-CM | POA: Diagnosis not present

## 2019-06-30 DIAGNOSIS — I10 Essential (primary) hypertension: Secondary | ICD-10-CM | POA: Diagnosis not present

## 2019-06-30 DIAGNOSIS — D473 Essential (hemorrhagic) thrombocythemia: Secondary | ICD-10-CM | POA: Diagnosis not present

## 2019-06-30 DIAGNOSIS — Z7189 Other specified counseling: Secondary | ICD-10-CM | POA: Diagnosis not present

## 2019-11-10 ENCOUNTER — Other Ambulatory Visit: Payer: Self-pay | Admitting: Internal Medicine

## 2019-11-10 DIAGNOSIS — R29898 Other symptoms and signs involving the musculoskeletal system: Secondary | ICD-10-CM

## 2019-11-10 DIAGNOSIS — H538 Other visual disturbances: Secondary | ICD-10-CM | POA: Diagnosis not present

## 2019-11-11 ENCOUNTER — Ambulatory Visit
Admission: RE | Admit: 2019-11-11 | Discharge: 2019-11-11 | Disposition: A | Discharge status: Home / Self Care | Payer: Medicare Other | Source: Ambulatory Visit | Attending: Internal Medicine | Admitting: Internal Medicine

## 2019-11-11 DIAGNOSIS — R531 Weakness: Secondary | ICD-10-CM | POA: Diagnosis not present

## 2019-11-11 DIAGNOSIS — R29898 Other symptoms and signs involving the musculoskeletal system: Secondary | ICD-10-CM

## 2019-11-15 ENCOUNTER — Ambulatory Visit (INDEPENDENT_AMBULATORY_CARE_PROVIDER_SITE_OTHER): Payer: Medicare Other | Admitting: Neurology

## 2019-11-15 ENCOUNTER — Other Ambulatory Visit: Payer: Self-pay

## 2019-11-15 ENCOUNTER — Encounter: Payer: Self-pay | Admitting: Neurology

## 2019-11-15 VITALS — BP 113/65 | HR 71 | Temp 97.0°F | Ht 61.0 in | Wt 135.0 lb

## 2019-11-15 DIAGNOSIS — R930 Abnormal findings on diagnostic imaging of skull and head, not elsewhere classified: Secondary | ICD-10-CM | POA: Diagnosis not present

## 2019-11-15 DIAGNOSIS — R29898 Other symptoms and signs involving the musculoskeletal system: Secondary | ICD-10-CM | POA: Diagnosis not present

## 2019-11-15 NOTE — Progress Notes (Signed)
PATIENT: Danielle Vaughan DOB: July 11, 1933  Chief Complaint  Patient presents with  . Cerebrovascular Accident    She is here with her daughter, [name]. She went to PCP about symptoms of blurred vision and right hand weakness. Reports recent CT head showing stroke. She is here to establish neurology care.   [name] PCP    [name], MD     HISTORICAL  Danielle Vaughan is a 86 year old female, seen in request by her primary care physician [name], [name] for evaluation of abnormal CT scan, she is accompanied by her daughter [name] at today's visit on November 15, 2019,  I have reviewed and summarized the referring note from the referring physician.  She had past medical history of hypertension, hyperlipidemia,  Since September 2020, she reported recurrent similar episode of blurry vision, right hand weakness following her evening meal on Sunday.  It happens intermittently,  She had a gradual decreased appetite over the years, but still active, able to have help with household chores, she tends to have smaller meal on Sunday, by evening time, when she finished her evening meal, she will often get blurry vision, right hand weakness, to the point of difficulty picking up her fork, symptoms last about 15 minutes, she denied loss of consciousness, denied increased gait abnormality, she has significant right knee pain is a candidate for right knee replacement, ambulate with a cane  For that reason, she was referred for CAT scan, I have personally reviewed CT head without contrast on November 11, 2019, generalized atrophy, supratentorium small vessel disease, subacute right parieto-occipital PCAs infarction  She was started on aspirin 81 mg daily, and Lipitor 10 mg every night since February 11   REVIEW OF SYSTEMS: Full 14 system review of systems performed and notable only for as above All other review of systems were negative.  ALLERGIES: Allergies  Allergen Reactions  .  Cefoxitin   . Ceftin [Cefuroxime Axetil] Other (See Comments)    Seeing lights  . Codeine Other (See Comments)    Unknown    HOME MEDICATIONS: Current Outpatient Medications  Medication Sig Dispense Refill  . amLODipine (NORVASC) 5 MG tablet Take 5 mg by mouth at bedtime.     . ASPIRIN 81 PO Take 81 mg by mouth daily.    [name] atorvastatin (LIPITOR) 10 MG tablet Take 10 mg by mouth daily.    . benazepril (LOTENSIN) 40 MG tablet Take 40 mg by mouth at bedtime.     . metoprolol succinate (TOPROL-XL) 50 MG 24 hr tablet Take 50 mg by mouth daily. Take with or immediately following a meal.     No current facility-administered medications for this visit.    PAST MEDICAL HISTORY: Past Medical History:  Diagnosis Date  . Hypertension   . Stroke [facility])     PAST SURGICAL HISTORY: Past Surgical History:  Procedure Laterality Date  . PARTIAL HYSTERECTOMY      FAMILY HISTORY: Family History  Adopted: Yes  Problem Relation Age of Onset  . Other Mother        unsure of history  . Other Father        unsure of history    SOCIAL HISTORY: Social History   Socioeconomic History  . Marital status: Widowed    Spouse name: Not on file  . Number of children: 6  . Years of education: 10th grade  . Highest education level: Not on file  Occupational History  . Occupation: Retired  Tobacco Use  .  Smoking status: Never Smoker  . Smokeless tobacco: Never Used  Substance and Sexual Activity  . Alcohol use: No  . Drug use: Never  . Sexual activity: Never  Other Topics Concern  . Not on file  Social History Narrative   Right-handed.   No daily use of caffeine.   She lives at home with three of her children (she has four living).   Social Determinants of Health   Financial Resource Strain:   . Difficulty of Paying Living Expenses: Not on file  Food Insecurity:   . Worried About Charity fundraiser in the Last Year: Not on file  . Ran Out of Food in the Last Year: Not on file    Transportation Needs:   . Lack of Transportation (Medical): Not on file  . Lack of Transportation (Non-Medical): Not on file  Physical Activity:   . Days of Exercise per Week: Not on file  . Minutes of Exercise per Session: Not on file  Stress:   . Feeling of Stress : Not on file  Social Connections:   . Frequency of Communication with Friends and Family: Not on file  . Frequency of Social Gatherings with Friends and Family: Not on file  . Attends Religious Services: Not on file  . Active Member of Clubs or Organizations: Not on file  . Attends Archivist Meetings: Not on file  . Marital Status: Not on file  Intimate Partner Violence:   . Fear of Current or Ex-Partner: Not on file  . Emotionally Abused: Not on file  . Physically Abused: Not on file  . Sexually Abused: Not on file     PHYSICAL EXAM   Vitals:   11/15/19 1411  BP: 113/65  Pulse: 71  Temp: (!) 97 F (36.1 C)  Weight: 135 lb (61.2 kg)  Height: 5\' 1"  (1.549 m)    Not recorded      Body mass index is 25.51 kg/m.  PHYSICAL EXAMNIATION:  Gen: NAD, conversant, well nourised, well groomed                     Cardiovascular: Irregular rate rhythm no peripheral edema, warm, nontender. Eyes: Conjunctivae clear without exudates or hemorrhage Neck: Supple, no carotid bruits. Pulmonary: Clear to auscultation bilaterally   NEUROLOGICAL EXAM:  MENTAL STATUS: Speech:    Speech is normal; fluent and spontaneous with normal comprehension.  Cognition:     Orientation to time, place and person     Normal recent and remote memory     Normal Attention span and concentration     Normal Language, naming, repeating,spontaneous speech     Fund of knowledge   CRANIAL NERVES: CN II: Visual fields are full to confrontation. Pupils are round equal and briskly reactive to light. CN III, IV, VI: extraocular movement are normal. No ptosis. CN V: Facial sensation is intact to light touch CN VII: Face is  symmetric with normal eye closure  CN VIII: Hearing is normal to causal conversation. CN IX, X: Phonation is normal. CN XI: Head turning and shoulder shrug are intact  MOTOR: There is no pronator drift of out-stretched arms. Muscle bulk and tone are normal. Muscle strength is normal.  REFLEXES: Reflexes are 2+ and symmetric at the biceps, triceps, knees, and ankles. Plantar responses are flexor.  SENSORY: Intact to light touch, pinprick and vibratory sensation are intact in fingers and toes.  COORDINATION: There is no trunk or limb dysmetria noted.  GAIT/STANCE:  She needs push-up to get up from seated position, hyperextension of right knee, antalgic, limp, dragging right leg   DIAGNOSTIC DATA (LABS, IMAGING, TESTING) - I reviewed patient records, labs, notes, testing and imaging myself where available.   ASSESSMENT AND PLAN  ALDONIA QUENNEVILLE is a 86 y.o. female   Recurrent spells of blurry vision, right hand weakness, CT head showed supratentorium small vessel disease, questionable right PCA area subacute stroke  She does has vascular risk factor of aging, hypertension, hyperlipidemia,  Proceed with MRI  Continue aspirin, increase water intake,  She was noted to have an irregular heart rhythm, will refer to EKG   Marcial Pacas, M.D. Ph.D.  [facility] [address], [place], [place] [address] Ph: [phone] Fax: [phone]  CC: [name], MD

## 2019-11-16 ENCOUNTER — Telehealth: Payer: Self-pay | Admitting: Neurology

## 2019-11-16 DIAGNOSIS — R9431 Abnormal electrocardiogram [ECG] [EKG]: Secondary | ICD-10-CM | POA: Diagnosis not present

## 2019-11-16 DIAGNOSIS — R002 Palpitations: Secondary | ICD-10-CM | POA: Diagnosis not present

## 2019-11-16 DIAGNOSIS — R29898 Other symptoms and signs involving the musculoskeletal system: Secondary | ICD-10-CM | POA: Diagnosis not present

## 2019-11-16 NOTE — Telephone Encounter (Signed)
Scheduled at [facility] for 11/19/19

## 2019-11-16 NOTE — Telephone Encounter (Signed)
Patient request open MRI.  Medicare/bcbs supp no auth order faxed to [facility] they will reach out to the patient to schedule

## 2019-11-19 DIAGNOSIS — I639 Cerebral infarction, unspecified: Secondary | ICD-10-CM | POA: Diagnosis not present

## 2019-11-22 ENCOUNTER — Telehealth: Payer: Self-pay | Admitting: *Deleted

## 2019-11-22 DIAGNOSIS — R002 Palpitations: Secondary | ICD-10-CM | POA: Diagnosis not present

## 2019-11-22 NOTE — Telephone Encounter (Addendum)
I spoke to her daughter on [place], Aerionna Waithe. She verbalized understanding of the MRI brain results. Reports the patient's health is unchanged from her recent physical evaluation in our office. She will make sure the appt is kept on 12/08/19 for further review of scan. She will call sooner for any concerns.

## 2019-11-22 NOTE — Telephone Encounter (Signed)
   CT head Nov 11 2019: Subactue infarction at right Findings consistent with cortical laminar necrosis and calcification at site of a moderate-sized late subacute right parietooccipital PCA territory infarct.  No significant mass effect or midline shift.  Mild generalized parenchymal atrophy and chronic small vessel ischemic disease. occipital lobe  I received radiology report call,  MRI of the brain without contrast from [facility] on November 22, 2019:  1. Small subacute infarction in the right occipital lobe cortex.  2. Moderate chronic small vessel ischemic change.  Please check with patient, if there is no acute changes, just to keep the follow-up appointment on March 10 as previously scheduled

## 2019-11-22 NOTE — Telephone Encounter (Signed)
MRI brain

## 2019-11-30 ENCOUNTER — Telehealth: Payer: Self-pay | Admitting: Neurology

## 2019-11-30 NOTE — Telephone Encounter (Signed)
Pt is asking if Danielle Vaughan will ask Dr [name] if there is a Cardiologist that she would personally suggest for her.  Please call

## 2019-12-01 ENCOUNTER — Encounter: Payer: Self-pay | Admitting: General Practice

## 2019-12-01 NOTE — Telephone Encounter (Signed)
[place] cardiologist shares the same epic system as our group, she may also ask her primary care for suggestions

## 2019-12-01 NOTE — Telephone Encounter (Signed)
I called the patient and left the information below on her voicemail. I provided our number to call back with any other questions.

## 2019-12-08 ENCOUNTER — Encounter: Payer: Self-pay | Admitting: Neurology

## 2019-12-08 ENCOUNTER — Ambulatory Visit (INDEPENDENT_AMBULATORY_CARE_PROVIDER_SITE_OTHER): Payer: Medicare Other | Admitting: Neurology

## 2019-12-08 ENCOUNTER — Telehealth: Payer: Self-pay | Admitting: Cardiology

## 2019-12-08 VITALS — BP 132/80 | HR 71 | Temp 97.2°F | Ht 61.0 in | Wt 135.5 lb

## 2019-12-08 DIAGNOSIS — I639 Cerebral infarction, unspecified: Secondary | ICD-10-CM | POA: Insufficient documentation

## 2019-12-08 DIAGNOSIS — H539 Unspecified visual disturbance: Secondary | ICD-10-CM | POA: Diagnosis not present

## 2019-12-08 MED ORDER — LEVETIRACETAM 250 MG PO TABS: 250.0000 mg | ORAL_TABLET | Freq: Two times a day (BID) | ORAL | 11 refills | Status: DC

## 2019-12-08 NOTE — Telephone Encounter (Signed)
Daughter of the patient called. Daughter wanted to know if she is able to come with the patient to her appointment tomorrow 12-09-19. The patient does not have dementia , but would like her daughter to be an extra set of ears in case she can't remember everything the Doctor has to say. The patient also has some mobility issues and feels most comfortable when her Daughter is there to help her.

## 2019-12-08 NOTE — Telephone Encounter (Signed)
Left a messaging informing daughter MD is ok with her attending appointment.

## 2019-12-08 NOTE — Progress Notes (Signed)
PATIENT: Danielle Vaughan DOB: Jan 27, 1933  Chief Complaint  Patient presents with  . Cerebrovascular Accident    She here with her daughter, Danielle Vaughan, to review her MRI results.     HISTORICAL  Danielle Vaughan is a 86 year old female, seen in request by her primary care physician [name], [name] for evaluation of abnormal CT scan, she is accompanied by her daughter Danielle Vaughan at today's visit on November 15, 2019,  I have reviewed and summarized the referring note from the referring physician.  She had past medical history of hypertension, hyperlipidemia,  Since September 2020, she reported recurrent similar episode of blurry vision, right hand weakness following her evening meal on Sunday.  It happens intermittently,  She had a gradual decreased appetite over the years, but still active, able to have help with household chores, she tends to have smaller meal on Sunday, by evening time, when she finished her evening meal, she will often get blurry vision, right hand weakness, to the point of difficulty picking up her fork, symptoms last about 15 minutes, she denied loss of consciousness, denied increased gait abnormality, she has significant right knee pain is a candidate for right knee replacement, ambulate with a cane  For that reason, she was referred for CAT scan, I have personally reviewed CT head without contrast on November 11, 2019, generalized atrophy, supratentorium small vessel disease, subacute right parieto-occipital PCAs infarction  She was started on aspirin 81 mg daily, and Lipitor 10 mg every night since February 11  Update December 08, 2019: At previous visit, she complains of spells only at Sunday, but on further questioning, she reported intermittent spells couple times each week, sudden onset mild confusion, slow reaction, funny feelings, with some visual distortion, sometimes right arm numbness, heaviness.  She describes 1 episode on November 23, 2019, after  visiting her son at nursing home, she was sitting in the car with the bright sunshine, she suddenly felt funny, mild visual distortion, right arm heaviness, lasting 10 to 15 minutes, she denied loss of consciousness.  Her daughter also described, when she was having a spells, she often was noticed by days into space, but when she interact with patient, she was a bit slow to react, but able to answer questions  She was seen by cardiologist, had 2 weeks cardiac monitoring, visit and reports are pending  I personally reviewed MRI of the brain without contrast on November 19, 2019 from [facility], subacute infarction at the right occipital lobe, moderate supratentorium small vessel disease  REVIEW OF SYSTEMS: Full 14 system review of systems performed and notable only for as above All other review of systems were negative.  ALLERGIES: Allergies  Allergen Reactions  . Cefoxitin   . Ceftin [Cefuroxime Axetil] Other (See Comments)    Seeing lights  . Codeine Other (See Comments)    Unknown    HOME MEDICATIONS: Current Outpatient Medications  Medication Sig Dispense Refill  . amLODipine (NORVASC) 5 MG tablet Take 5 mg by mouth at bedtime.     . ASPIRIN 81 PO Take 81 mg by mouth daily.    [name] atorvastatin (LIPITOR) 10 MG tablet Take 10 mg by mouth daily.    . benazepril (LOTENSIN) 40 MG tablet Take 40 mg by mouth at bedtime.     . metoprolol tartrate (LOPRESSOR) 50 MG tablet Take 50 mg by mouth 2 (two) times daily.     No current facility-administered medications for this visit.    PAST MEDICAL HISTORY:  Past Medical History:  Diagnosis Date  . Hypertension   . Stroke [facility])     PAST SURGICAL HISTORY: Past Surgical History:  Procedure Laterality Date  . PARTIAL HYSTERECTOMY      FAMILY HISTORY: Family History  Adopted: Yes  Problem Relation Age of Onset  . Other Mother        unsure of history  . Other Father        unsure of history    SOCIAL HISTORY: Social History    Socioeconomic History  . Marital status: Widowed    Spouse name: Not on file  . Number of children: 6  . Years of education: 10th grade  . Highest education level: Not on file  Occupational History  . Occupation: Retired  Tobacco Use  . Smoking status: Never Smoker  . Smokeless tobacco: Never Used  Substance and Sexual Activity  . Alcohol use: No  . Drug use: Never  . Sexual activity: Never  Other Topics Concern  . Not on file  Social History Narrative   Right-handed.   No daily use of caffeine.   She lives at home with three of her children (she has four living).   Social Determinants of Health   Financial Resource Strain:   . Difficulty of Paying Living Expenses: Not on file  Food Insecurity:   . Worried About Charity fundraiser in the Last Year: Not on file  . Ran Out of Food in the Last Year: Not on file  Transportation Needs:   . Lack of Transportation (Medical): Not on file  . Lack of Transportation (Non-Medical): Not on file  Physical Activity:   . Days of Exercise per Week: Not on file  . Minutes of Exercise per Session: Not on file  Stress:   . Feeling of Stress : Not on file  Social Connections:   . Frequency of Communication with Friends and Family: Not on file  . Frequency of Social Gatherings with Friends and Family: Not on file  . Attends Religious Services: Not on file  . Active Member of Clubs or Organizations: Not on file  . Attends Archivist Meetings: Not on file  . Marital Status: Not on file  Intimate Partner Violence:   . Fear of Current or Ex-Partner: Not on file  . Emotionally Abused: Not on file  . Physically Abused: Not on file  . Sexually Abused: Not on file     PHYSICAL EXAM   Vitals:   12/08/19 1347  BP: 132/80  Pulse: 71  Temp: (!) 97.2 F (36.2 C)  Weight: 135 lb 8 oz (61.5 kg)  Height: 5\' 1"  (1.549 m)    Not recorded      Body mass index is 25.6 kg/m.  PHYSICAL EXAMNIATION:  Gen: NAD, conversant,  well nourised, well groomed                     Cardiovascular: Irregular rate rhythm  Eyes: Conjunctivae clear without exudates or hemorrhage Neck: Supple, no carotid bruits. Pulmonary: Clear to auscultation bilaterally   NEUROLOGICAL EXAM:  MENTAL STATUS: Speech:    Speech is normal; fluent and spontaneous with normal comprehension.  Cognition:     Orientation to time, place and person     Normal recent and remote memory     Normal Attention span and concentration     Normal Language, naming, repeating,spontaneous speech     Fund of knowledge   CRANIAL NERVES: CN II: Visual  fields are full to confrontation. Pupils are round equal and briskly reactive to light. CN III, IV, VI: extraocular movement are normal. No ptosis. CN V: Facial sensation is intact to light touch CN VII: Face is symmetric with normal eye closure  CN VIII: Hearing is normal to causal conversation. CN IX, X: Phonation is normal. CN XI: Head turning and shoulder shrug are intact  MOTOR: There is no pronator drift of out-stretched arms. Muscle bulk and tone are normal. Muscle strength is normal.  REFLEXES: Reflexes are 2+ and symmetric at the biceps, triceps, knees, and ankles. Plantar responses are flexor.  SENSORY: Intact to light touch, pinprick and vibratory sensation are intact in fingers and toes.  COORDINATION: There is no trunk or limb dysmetria noted.  GAIT/STANCE: She needs push-up to get up from seated position, hyperextension of right knee, antalgic, limp    DIAGNOSTIC DATA (LABS, IMAGING, TESTING) - I reviewed patient records, labs, notes, testing and imaging myself where available.   ASSESSMENT AND PLAN  TAETUM ARRATIA is a 86 y.o. female   Right occipital stroke  She has vascular risk factor of hypertension, aging, hyperlipidemia  Keep aspirin 81 mg daily  Complete evaluation with her cardiologist  Ultrasound of carotid artery  Echocardiogram  Cardiac arrhythmia  Is under  the care of cardiologist, 2 weeks cardiac monitoring, will follow up the result,  Recurrent spells of blurry vision, right hand weakness,   Probable partial seizure,  Add on Keppra 250 mg twice a day   Marcial Pacas, M.D. Ph.D.  [facility] [address], [place], [place] [address] Ph: [phone] Fax: [phone]  CC: [name], MD

## 2019-12-09 ENCOUNTER — Other Ambulatory Visit: Payer: Self-pay

## 2019-12-09 ENCOUNTER — Encounter: Payer: Self-pay | Admitting: Cardiology

## 2019-12-09 ENCOUNTER — Ambulatory Visit (INDEPENDENT_AMBULATORY_CARE_PROVIDER_SITE_OTHER): Payer: Medicare Other | Admitting: Cardiology

## 2019-12-09 VITALS — BP 138/74 | HR 66 | Ht 61.0 in | Wt 132.0 lb

## 2019-12-09 DIAGNOSIS — Z7189 Other specified counseling: Secondary | ICD-10-CM

## 2019-12-09 DIAGNOSIS — I10 Essential (primary) hypertension: Secondary | ICD-10-CM | POA: Diagnosis not present

## 2019-12-09 DIAGNOSIS — E785 Hyperlipidemia, unspecified: Secondary | ICD-10-CM | POA: Diagnosis not present

## 2019-12-09 DIAGNOSIS — I639 Cerebral infarction, unspecified: Secondary | ICD-10-CM

## 2019-12-09 DIAGNOSIS — Z8673 Personal history of transient ischemic attack (TIA), and cerebral infarction without residual deficits: Secondary | ICD-10-CM | POA: Diagnosis not present

## 2019-12-09 NOTE — Patient Instructions (Signed)

## 2019-12-09 NOTE — Progress Notes (Signed)
Cardiology Office Note:    Date:  12/09/2019   ID:  HURLEY BOSEN, DOB 17-Nov-1932, MRN [id number]  PCP:  [name], MD  Cardiologist:  Buford Dresser, MD  Referring MD: Marcial Pacas, MD   CC: new patient evaluation for history of stroke  History of Present Illness:    Danielle Vaughan is a 86 y.o. female with a hx of hypertension, hyperlipidemia who is seen as a new consult at the request of Marcial Pacas, MD for the evaluation and management of stroke.  Seen by Dr. [name] yesterday in neurology. Note reviewed. History of intermittent blurry vision, right hand weakness since 06/2019. She had CT head for these symptoms, which noted generalized atrophy, supratentorial small vessel disease, and subacute right parieto-occipital PCA infarct. She has been started on aspirin 81 mg and atorvastatin 10 mg daily. Notes document that she was seen by cardiology, but I have no records of this. She is pending echocardiogram and carotid doppler studies.  She states she has never seen a cardiologist before, but she has worn a heart monitor many years ago. [name] with [employer] ordered a heart monitor for 14 days, just finished a Zio and sent it back in the mail.  Cardiovascular risk factors: Prior clinical ASCVD: no MI, recent stroke as above Comorbid conditions: endorses hypertension. Denies hyperlipidemia, diabetes, chronic kidney disease  Metabolic syndrome/Obesity: none, BMI 25 Chronic inflammatory conditions: none Tobacco use history: never Family history: son died of MI age 67. Prior cardiac testing and/or incidental findings on other testing: none Exercise level: able to walk around the house, climbs porch stairs without issue, no tall stairs in her house Current diet: lots of greens, vegetables. Does eat fried food 3-4 times/week. Not a lot of pork like bacon/sausage.  Denies chest pain, shortness of breath at rest or with normal exertion. No PND, orthopnea, LE  edema or unexpected weight gain. No syncope or palpitations.  Past Medical History:  Diagnosis Date  . Hypertension   . Stroke [facility])     Past Surgical History:  Procedure Laterality Date  . PARTIAL HYSTERECTOMY      Current Medications: Current Outpatient Medications on File Prior to Visit  Medication Sig  . amLODipine (NORVASC) 5 MG tablet Take 5 mg by mouth at bedtime.   . ASPIRIN 81 PO Take 81 mg by mouth daily.  [name] atorvastatin (LIPITOR) 10 MG tablet Take 10 mg by mouth daily.  . benazepril (LOTENSIN) 40 MG tablet Take 40 mg by mouth at bedtime.   . levETIRAcetam (KEPPRA) 250 MG tablet Take 1 tablet (250 mg total) by mouth 2 (two) times daily.  . metoprolol tartrate (LOPRESSOR) 50 MG tablet Take 50 mg by mouth 2 (two) times daily.   No current facility-administered medications on file prior to visit.     Allergies:   Cefoxitin, Ceftin [cefuroxime axetil], and Codeine   Social History   Tobacco Use  . Smoking status: Never Smoker  . Smokeless tobacco: Never Used  Substance Use Topics  . Alcohol use: No  . Drug use: Never    Family History: family history includes Other in her father and mother. She was adopted.  ROS:   Please see the history of present illness.  Additional pertinent ROS: Constitutional: Negative for chills, fever, night sweats, unintentional weight loss  HENT: Negative for ear pain and hearing loss.   Eyes: Negative for loss of vision and eye pain.  Respiratory: Negative for cough, sputum, wheezing.   Cardiovascular: See  HPI. Gastrointestinal: Negative for abdominal pain, melena, and hematochezia.  Genitourinary: Negative for dysuria and hematuria.  Musculoskeletal: Negative for falls and myalgias.  Skin: Negative for itching and rash.  Neurological: Negative for focal weakness, focal sensory changes and loss of consciousness.  Endo/Heme/Allergies: Does not bruise/bleed easily.     EKGs/Labs/Other Studies Reviewed:    The following studies  were reviewed today: Echo pending  EKG:  EKG is personally reviewed.  The ekg ordered today demonstrates sinus rhythm with PVCs  Recent Labs: No results found for requested labs within last 8760 hours.  Recent Lipid Panel No results found for: CHOL, TRIG, HDL, CHOLHDL, VLDL, LDLCALC, LDLDIRECT  Physical Exam:    VS:  BP 138/74   Pulse 66   Ht 5\' 1"  (1.549 m)   Wt 132 lb (59.9 kg)   SpO2 97%   BMI 24.94 kg/m     Wt Readings from Last 3 Encounters:  12/09/19 132 lb (59.9 kg)  12/08/19 135 lb 8 oz (61.5 kg)  11/15/19 135 lb (61.2 kg)    GEN: Well nourished, well developed in no acute distress HEENT: Normal, moist mucous membranes NECK: No JVD CARDIAC: regular rhythm, normal S1 and S2, no rubs or gallops. No murmurs. VASCULAR: Radial and DP pulses 2+ bilaterally. No carotid bruits RESPIRATORY:  Clear to auscultation without rales, wheezing or rhonchi  ABDOMEN: Soft, non-tender, non-distended MUSCULOSKELETAL:  Ambulates independently SKIN: Warm and dry, no edema NEUROLOGIC:  Alert and oriented x 3. No focal neuro deficits noted. PSYCHIATRIC:  Normal affect    ASSESSMENT:    1. History of CVA (cerebrovascular accident)   2. Essential hypertension   3. Hyperlipidemia, unspecified hyperlipidemia type   4. Cardiac risk counseling   5. Counseling on health promotion and disease prevention    PLAN:    History of stroke: -followed by Dr. [name] in neurology -CT with subacute right parieto-occipital PCA infarct, with generalized atrophy and small vessel changes -continue aspirin 81 mg, atorvastatin 10 mg -she has an echocardiogram pending -Dr Ashby Dawes ordered 14 day Zio, just mailed back. I do not have access to these results -CV risk factor modification, as below.  Hypertension: -continue amlodipine 5 mg daily -continue benazepril 40 mg daily -continue metoprolol tartrate 50 mg BID  Hyperlipidemia: -continue atorvastatin 10 mg daily  Cardiac risk counseling and  prevention recommendations: -recommend heart healthy/Mediterranean diet, with whole grains, fruits, vegetable, fish, lean meats, nuts, and olive oil. Limit salt. -recommend moderate walking, 3-5 times/week for 30-50 minutes each session. Aim for at least 150 minutes.week. Goal should be pace of 3 miles/hours, or walking 1.5 miles in 30 minutes -recommend avoidance of tobacco products. Avoid excess alcohol.   Plan for follow up: 1 year or sooner as needed  Buford Dresser, MD, PhD [place]  [facility]    Medication Adjustments/Labs and Tests Ordered: Current medicines are reviewed at length with the patient today.  Concerns regarding medicines are outlined above.  No orders of the defined types were placed in this encounter.  No orders of the defined types were placed in this encounter.   Patient Instructions  Medication Instructions:  Your Physician recommend you continue on your current medication as directed.    *If you need a refill on your cardiac medications before your next appointment, please call your pharmacy*   Lab Work: None   Testing/Procedures: None   Follow-Up: At [facility], you and your health needs are our priority.  As part of our continuing mission to  provide you with exceptional heart care, we have created designated Provider Care Teams.  These Care Teams include your primary Cardiologist (physician) and Advanced Practice Providers (APPs -  Physician Assistants and Nurse Practitioners) who all work together to provide you with the care you need, when you need it.  We recommend signing up for the patient portal called "MyChart".  Sign up information is provided on this After Visit Summary.  MyChart is used to connect with patients for Virtual Visits (Telemedicine).  Patients are able to view lab/test results, encounter notes, upcoming appointments, etc.  Non-urgent messages can be sent to your provider as well.   To learn more about what you  can do with MyChart, go to [email or web address].    Your next appointment:   1 year(s)  The format for your next appointment:   In Person  Provider:   Buford Dresser, MD       Signed, Buford Dresser, MD PhD 12/09/2019 4:01 PM    [place]

## 2019-12-18 DIAGNOSIS — R002 Palpitations: Secondary | ICD-10-CM | POA: Diagnosis not present

## 2019-12-20 ENCOUNTER — Encounter (HOSPITAL_COMMUNITY): Payer: Medicare Other

## 2019-12-21 DIAGNOSIS — R002 Palpitations: Secondary | ICD-10-CM | POA: Diagnosis not present

## 2019-12-22 ENCOUNTER — Ambulatory Visit (HOSPITAL_COMMUNITY)
Admission: RE | Admit: 2019-12-22 | Discharge: 2019-12-22 | Disposition: A | Discharge status: Home / Self Care | Payer: Medicare Other | Source: Ambulatory Visit | Attending: Neurology | Admitting: Neurology

## 2019-12-22 ENCOUNTER — Telehealth: Payer: Self-pay | Admitting: Neurology

## 2019-12-22 ENCOUNTER — Other Ambulatory Visit: Payer: Self-pay

## 2019-12-22 DIAGNOSIS — H539 Unspecified visual disturbance: Secondary | ICD-10-CM | POA: Diagnosis not present

## 2019-12-22 DIAGNOSIS — R9431 Abnormal electrocardiogram [ECG] [EKG]: Secondary | ICD-10-CM | POA: Diagnosis not present

## 2019-12-22 DIAGNOSIS — I10 Essential (primary) hypertension: Secondary | ICD-10-CM | POA: Diagnosis not present

## 2019-12-22 DIAGNOSIS — I639 Cerebral infarction, unspecified: Secondary | ICD-10-CM | POA: Diagnosis not present

## 2019-12-22 DIAGNOSIS — R5383 Other fatigue: Secondary | ICD-10-CM | POA: Diagnosis not present

## 2019-12-22 DIAGNOSIS — D473 Essential (hemorrhagic) thrombocythemia: Secondary | ICD-10-CM | POA: Diagnosis not present

## 2019-12-22 DIAGNOSIS — Z0000 Encounter for general adult medical examination without abnormal findings: Secondary | ICD-10-CM | POA: Diagnosis not present

## 2019-12-22 NOTE — Telephone Encounter (Signed)
Left patient a detailed message on her dgt's vm, with results (ok per DPR). I also gave the instructions to continue taking aspirin 81mg , one tablet daily.  Provided our number to call back with any questions.

## 2019-12-22 NOTE — Telephone Encounter (Signed)
Summary: Right Carotid: Velocities in the right ICA are consistent with a 1-39% stenosis.  Left Carotid: Velocities in the left ICA are consistent with a 1-39% stenosis.  Vertebrals: Bilateral vertebral arteries demonstrate antegrade flow.  *See table(s) above for measurements and observations.   Please call patient, ultrasound of carotid arteries showed less than 39% stenosis bilaterally, she should continue aspirin 81 mg daily

## 2019-12-22 NOTE — Progress Notes (Signed)
Carotid duplex has been completed.   Preliminary results in CV Proc.   Abram Sander 12/22/2019 1:31 PM

## 2019-12-28 ENCOUNTER — Other Ambulatory Visit: Payer: Self-pay

## 2019-12-28 ENCOUNTER — Ambulatory Visit (HOSPITAL_COMMUNITY): Payer: Medicare Other | Attending: Cardiology

## 2019-12-28 DIAGNOSIS — I639 Cerebral infarction, unspecified: Secondary | ICD-10-CM | POA: Insufficient documentation

## 2019-12-28 DIAGNOSIS — I081 Rheumatic disorders of both mitral and tricuspid valves: Secondary | ICD-10-CM | POA: Insufficient documentation

## 2019-12-28 DIAGNOSIS — I119 Hypertensive heart disease without heart failure: Secondary | ICD-10-CM | POA: Diagnosis not present

## 2019-12-28 DIAGNOSIS — I6389 Other cerebral infarction: Secondary | ICD-10-CM | POA: Diagnosis not present

## 2019-12-28 DIAGNOSIS — H539 Unspecified visual disturbance: Secondary | ICD-10-CM | POA: Diagnosis not present

## 2019-12-29 ENCOUNTER — Telehealth: Payer: Self-pay | Admitting: Neurology

## 2019-12-29 DIAGNOSIS — E782 Mixed hyperlipidemia: Secondary | ICD-10-CM | POA: Diagnosis not present

## 2019-12-29 DIAGNOSIS — R9431 Abnormal electrocardiogram [ECG] [EKG]: Secondary | ICD-10-CM | POA: Diagnosis not present

## 2019-12-29 DIAGNOSIS — R5383 Other fatigue: Secondary | ICD-10-CM | POA: Diagnosis not present

## 2019-12-29 DIAGNOSIS — R7303 Prediabetes: Secondary | ICD-10-CM | POA: Diagnosis not present

## 2019-12-29 DIAGNOSIS — I10 Essential (primary) hypertension: Secondary | ICD-10-CM | POA: Diagnosis not present

## 2019-12-29 DIAGNOSIS — R29898 Other symptoms and signs involving the musculoskeletal system: Secondary | ICD-10-CM | POA: Diagnosis not present

## 2019-12-29 DIAGNOSIS — D473 Essential (hemorrhagic) thrombocythemia: Secondary | ICD-10-CM | POA: Diagnosis not present

## 2019-12-29 NOTE — Telephone Encounter (Signed)
1. Left ventricular ejection fraction, by estimation, is 55 to 60%. Left ventricular ejection fraction by 3D volume is 55 %. The left ventricle has normal function. The left ventricle has no regional wall motion abnormalities. There is moderate  asymmetric left ventricular hypertrophy of the basal-septal segment. Left ventricular diastolic parameters are consistent with Grade II diastolic dysfunction (pseudonormalization). Elevated left atrial pressure. The average left ventricular global  longitudinal strain is -18.0 %.  2. Right ventricular systolic function is normal. The right ventricular size is normal. There is moderately elevated pulmonary artery systolic pressure. The estimated right ventricular systolic pressure is 123456 mmHg.  3. Left atrial size was severely dilated.  4. Right atrial size was mildly dilated.  5. The mitral valve is normal in structure. Mild mitral valve regurgitation.  6. The aortic valve is tricuspid. Aortic valve regurgitation is not visualized. No aortic stenosis is present.  7. The inferior vena cava is dilated in size with >50% respiratory variability, suggesting right atrial pressure of 8 mmHg.  Please call patient, echocardiogram showed normal left ventricle function, moderate asymmetric left ventricular hypertrophy of the basal septal segment, right ventricle systolic click functions normal, left atrial size was severely dilated,  Continue current management, make sure she keep up follow-up visit with her cardiologist

## 2019-12-29 NOTE — Telephone Encounter (Signed)
I spoke to her daughter, [name], who verbalized understanding of the echo results. States the patient has an appt this afternoon w/ her PCP. Dr. [name] has sent her PCP and cardiologist a message about the echocardiogram.

## 2019-12-30 NOTE — Telephone Encounter (Addendum)
Pt requested a CB to discuss results [phone] [phone]

## 2019-12-30 NOTE — Telephone Encounter (Signed)
I returned the call to the patient. She had her appt w/ her PCP yesterday and the echocardiogram results were further reviewed with her. She would still like to speak to cardiology about the findings and plans to call their office for an follow up.

## 2020-01-27 ENCOUNTER — Encounter: Payer: Self-pay | Admitting: Cardiology

## 2020-01-27 DIAGNOSIS — Z8673 Personal history of transient ischemic attack (TIA), and cerebral infarction without residual deficits: Secondary | ICD-10-CM | POA: Insufficient documentation

## 2020-01-27 NOTE — Addendum Note (Signed)
Addended by: Buford Dresser A on: 01/27/2020 04:41 PM   Modules accepted: Orders

## 2020-01-28 DIAGNOSIS — R9431 Abnormal electrocardiogram [ECG] [EKG]: Secondary | ICD-10-CM | POA: Diagnosis not present

## 2020-01-28 DIAGNOSIS — R531 Weakness: Secondary | ICD-10-CM | POA: Diagnosis not present

## 2020-01-28 DIAGNOSIS — I639 Cerebral infarction, unspecified: Secondary | ICD-10-CM | POA: Diagnosis not present

## 2020-01-29 ENCOUNTER — Ambulatory Visit: Payer: Medicare Other | Attending: Internal Medicine

## 2020-01-29 DIAGNOSIS — Z23 Encounter for immunization: Secondary | ICD-10-CM

## 2020-01-29 NOTE — Progress Notes (Signed)
   Covid-19 Vaccination Clinic  Name:  Danielle Vaughan    MRN: [id number] DOB: 04/21/1933  01/29/2020  [name] was observed post Covid-19 immunization for 15 minutes without incident. She was provided with Vaccine Information Sheet and instruction to access the V-Safe system.   [name] was instructed to call 911 with any severe reactions post vaccine: [name] Difficulty breathing  . Swelling of face and throat  . A fast heartbeat  . A bad rash all over body  . Dizziness and weakness   Immunizations Administered    Name Date Dose VIS Date Route   Pfizer COVID-19 Vaccine 01/29/2020  9:21 AM 0.3 mL 11/24/2018 Intramuscular   Manufacturer: [place]   Lot: [id number]   [place]: [id number]

## 2020-02-14 ENCOUNTER — Encounter: Payer: Self-pay | Admitting: General Practice

## 2020-02-21 ENCOUNTER — Ambulatory Visit: Payer: Medicare Other | Attending: Internal Medicine

## 2020-02-21 DIAGNOSIS — Z23 Encounter for immunization: Secondary | ICD-10-CM

## 2020-02-21 NOTE — Progress Notes (Signed)
° °  Covid-19 Vaccination Clinic  Name:  Danielle Vaughan    MRN: [id number] DOB: August 19, 1933  02/21/2020  [name] was observed post Covid-19 immunization for 15 minutes without incident. She was provided with Vaccine Information Sheet and instruction to access the V-Safe system.   [name] was instructed to call 911 with any severe reactions post vaccine:  Difficulty breathing   Swelling of face and throat   A fast heartbeat   A bad rash all over body   Dizziness and weakness   Immunizations Administered    Name Date Dose VIS Date Route   Pfizer COVID-19 Vaccine 02/21/2020  2:57 PM 0.3 mL 11/24/2018 Intramuscular   Manufacturer: [place]   Lot: [id number]   [place]: [id number]

## 2020-03-01 ENCOUNTER — Telehealth: Payer: Self-pay | Admitting: Cardiology

## 2020-03-01 NOTE — Telephone Encounter (Signed)
Accessed patient's chart due to her receiving a letter and being confused as to what it was in regards to. Advised patient she can disregard letter due to it being sent in error.

## 2020-03-09 ENCOUNTER — Encounter: Payer: Self-pay | Admitting: Neurology

## 2020-03-09 ENCOUNTER — Ambulatory Visit: Payer: Medicare Other | Admitting: Neurology

## 2020-03-09 NOTE — Progress Notes (Deleted)
PATIENT: Danielle Vaughan DOB: 07/24/1933  REASON FOR VISIT: follow up HISTORY FROM: patient  HISTORY OF PRESENT ILLNESS: Today 03/09/20  HISTORY  Danielle Vaughan is a 86 year old female, seen in request by her primary care physician [name], [name] for evaluation of abnormal CT scan, she is accompanied by her daughter [name] at today's visit on November 15, 2019,  I have reviewed and summarized the referring note from the referring physician.  She had past medical history of hypertension, hyperlipidemia,  Since September 2020, she reported recurrent similar episode of blurry vision, right hand weakness following her evening meal on Sunday.  It happens intermittently,  She had a gradual decreased appetite over the years, but still active, able to have help with household chores, she tends to have smaller meal on Sunday, by evening time, when she finished her evening meal, she will often get blurry vision, right hand weakness, to the point of difficulty picking up her fork, symptoms last about 15 minutes, she denied loss of consciousness, denied increased gait abnormality, she has significant right knee pain is a candidate for right knee replacement, ambulate with a cane  For that reason, she was referred for CAT scan, I have personally reviewed CT head without contrast on November 11, 2019, generalized atrophy, supratentorium small vessel disease, subacute right parieto-occipital PCAs infarction  She was started on aspirin 81 mg daily, and Lipitor 10 mg every night since February 11  Update December 08, 2019: At previous visit, she complains of spells only at Sunday, but on further questioning, she reported intermittent spells couple times each week, sudden onset mild confusion, slow reaction, funny feelings, with some visual distortion, sometimes right arm numbness, heaviness.  She describes 1 episode on November 23, 2019, after visiting her son at nursing home, she was  sitting in the car with the bright sunshine, she suddenly felt funny, mild visual distortion, right arm heaviness, lasting 10 to 15 minutes, she denied loss of consciousness.  Her daughter also described, when she was having a spells, she often was noticed by days into space, but when she interact with patient, she was a bit slow to react, but able to answer questions  She was seen by cardiologist, had 2 weeks cardiac monitoring, visit and reports are pending  I personally reviewed MRI of the brain without contrast on November 19, 2019 from [facility], subacute infarction at the right occipital lobe, moderate supratentorium small vessel disease   Update March 09, 2020 SS:   REVIEW OF SYSTEMS: Out of a complete 14 system review of symptoms, the patient complains only of the following symptoms, and all other reviewed systems are negative.  ALLERGIES: Allergies  Allergen Reactions  . Cefoxitin   . Ceftin [Cefuroxime Axetil] Other (See Comments)    Seeing lights  . Codeine Other (See Comments)    Unknown    HOME MEDICATIONS: Outpatient Medications Prior to Visit  Medication Sig Dispense Refill  . amLODipine (NORVASC) 5 MG tablet Take 5 mg by mouth at bedtime.     . ASPIRIN 81 PO Take 81 mg by mouth daily.    [name] atorvastatin (LIPITOR) 10 MG tablet Take 10 mg by mouth daily.    . benazepril (LOTENSIN) 40 MG tablet Take 40 mg by mouth at bedtime.     . levETIRAcetam (KEPPRA) 250 MG tablet Take 1 tablet (250 mg total) by mouth 2 (two) times daily. 60 tablet 11  . metoprolol tartrate (LOPRESSOR) 50 MG tablet Take 50  mg by mouth 2 (two) times daily.     No facility-administered medications prior to visit.    PAST MEDICAL HISTORY: Past Medical History:  Diagnosis Date  . Hypertension   . Stroke [facility])     PAST SURGICAL HISTORY: Past Surgical History:  Procedure Laterality Date  . PARTIAL HYSTERECTOMY      FAMILY HISTORY: Family History  Adopted: Yes  Problem Relation Age of  Onset  . Other Mother        unsure of history  . Other Father        unsure of history    SOCIAL HISTORY: Social History   Socioeconomic History  . Marital status: Widowed    Spouse name: Not on file  . Number of children: 6  . Years of education: 10th grade  . Highest education level: Not on file  Occupational History  . Occupation: Retired  Tobacco Use  . Smoking status: Never Smoker  . Smokeless tobacco: Never Used  Substance and Sexual Activity  . Alcohol use: No  . Drug use: Never  . Sexual activity: Never  Other Topics Concern  . Not on file  Social History Narrative   Right-handed.   No daily use of caffeine.   She lives at home with three of her children (she has four living).   Social Determinants of Health   Financial Resource Strain:   . Difficulty of Paying Living Expenses:   Food Insecurity:   . Worried About Charity fundraiser in the Last Year:   . Arboriculturist in the Last Year:   Transportation Needs:   . Film/video editor (Medical):   [name] Lack of Transportation (Non-Medical):   Physical Activity:   . Days of Exercise per Week:   . Minutes of Exercise per Session:   Stress:   . Feeling of Stress :   Social Connections:   . Frequency of Communication with Friends and Family:   . Frequency of Social Gatherings with Friends and Family:   . Attends Religious Services:   . Active Member of Clubs or Organizations:   . Attends Archivist Meetings:   [name] Marital Status:   Intimate Partner Violence:   . Fear of Current or Ex-Partner:   . Emotionally Abused:   [name] Physically Abused:   . Sexually Abused:       PHYSICAL EXAM  There were no vitals filed for this visit. There is no height or weight on file to calculate BMI.  Generalized: Well developed, in no acute distress   Neurological examination  Mentation: Alert oriented to time, place, history taking. Follows all commands speech and language fluent Cranial nerve II-XII:  Pupils were equal round reactive to light. Extraocular movements were full, visual field were full on confrontational test. Facial sensation and strength were normal. Uvula tongue midline. Head turning and shoulder shrug  were normal and symmetric. Motor: The motor testing reveals 5 over 5 strength of all 4 extremities. Good symmetric motor tone is noted throughout.  Sensory: Sensory testing is intact to soft touch on all 4 extremities. No evidence of extinction is noted.  Coordination: Cerebellar testing reveals good finger-nose-finger and heel-to-shin bilaterally.  Gait and station: Gait is normal. Tandem gait is normal. Romberg is negative. No drift is seen.  Reflexes: Deep tendon reflexes are symmetric and normal bilaterally.   DIAGNOSTIC DATA (LABS, IMAGING, TESTING) - I reviewed patient records, labs, notes, testing and imaging myself where available.  Lab Results  Component Value Date   WBC 7.0 06/07/2017   HGB 12.1 06/07/2017   HCT 35.5 (L) 06/07/2017   MCV 90.6 06/07/2017   PLT 373 06/07/2017      Component Value Date/Time   NA 140 06/07/2017 1422   K 3.4 (L) 06/07/2017 1422   CL 107 06/07/2017 1422   CO2 21 (L) 06/07/2017 1422   GLUCOSE 97 06/07/2017 1422   BUN 18 06/07/2017 1422   CREATININE 0.94 06/07/2017 1422   CALCIUM 8.9 06/07/2017 1422   GFRNONAA 55 (L) 06/07/2017 1422   GFRAA >60 06/07/2017 1422   No results found for: CHOL, HDL, LDLCALC, LDLDIRECT, TRIG, CHOLHDL No results found for: HGBA1C No results found for: VITAMINB12 No results found for: TSH    ASSESSMENT AND PLAN 86 y.o. year old female  has a past medical history of Hypertension and Stroke ([place]). here with:  1.  Right occipital stroke -Vascular risk factors of hypertension, aging, hyperlipidemia -Continue aspirin 81 mg daily -Carotid ultrasound showed less than 39% stenosis bilaterally -Echocardiogram showed normal left ventricle function, moderate asymmetric left ventricular hypertrophy of the  basal septal segment, right ventricle systolic click functions normal, left atrial size with severely dilated 2.  Cardiac arrhythmia 3.  Recurrent spells of blurry vision, right hand weakness -Probable partial seizure -Continue Keppra 250 mg twice a day   I spent 15 minutes with the patient. 50% of this time was spent   Butler Denmark, [place], DNP 03/09/2020, 5:52 AM [facility] [address] [place], [place] [address] [phone]

## 2020-04-06 ENCOUNTER — Other Ambulatory Visit: Payer: Self-pay

## 2020-04-06 ENCOUNTER — Encounter: Payer: Self-pay | Admitting: Cardiology

## 2020-04-06 ENCOUNTER — Ambulatory Visit (INDEPENDENT_AMBULATORY_CARE_PROVIDER_SITE_OTHER): Payer: Medicare Other | Admitting: Cardiology

## 2020-04-06 VITALS — BP 115/70 | HR 70 | Temp 94.1°F | Ht 60.0 in | Wt 133.8 lb

## 2020-04-06 DIAGNOSIS — I472 Ventricular tachycardia: Secondary | ICD-10-CM | POA: Diagnosis not present

## 2020-04-06 DIAGNOSIS — I10 Essential (primary) hypertension: Secondary | ICD-10-CM

## 2020-04-06 DIAGNOSIS — Z8673 Personal history of transient ischemic attack (TIA), and cerebral infarction without residual deficits: Secondary | ICD-10-CM

## 2020-04-06 DIAGNOSIS — Z7189 Other specified counseling: Secondary | ICD-10-CM | POA: Diagnosis not present

## 2020-04-06 DIAGNOSIS — I493 Ventricular premature depolarization: Secondary | ICD-10-CM | POA: Diagnosis not present

## 2020-04-06 DIAGNOSIS — I639 Cerebral infarction, unspecified: Secondary | ICD-10-CM | POA: Diagnosis not present

## 2020-04-06 DIAGNOSIS — E785 Hyperlipidemia, unspecified: Secondary | ICD-10-CM | POA: Diagnosis not present

## 2020-04-06 DIAGNOSIS — I471 Supraventricular tachycardia: Secondary | ICD-10-CM | POA: Diagnosis not present

## 2020-04-06 DIAGNOSIS — I4729 Other ventricular tachycardia: Secondary | ICD-10-CM

## 2020-04-06 NOTE — Patient Instructions (Signed)

## 2020-04-06 NOTE — Progress Notes (Signed)
Cardiology Office Note:    Date:  04/06/2020   ID:  Danielle Vaughan, DOB 1933/03/29, MRN [id number]  PCP:  Danielle Seashore, MD  Cardiologist:  Danielle Dresser, MD  Referring MD: Danielle Seashore, MD   CC: followup  History of Present Illness:    Danielle Vaughan is a 86 y.o. female with a hx of hypertension, hyperlipidemia who is seen for follow up today. I initially met her 12/09/19 as a new consult at the request of Danielle Seashore, MD for the evaluation and management of stroke.  Neuro history: History of intermittent blurry vision, right hand weakness since 06/2019. She had CT head for these symptoms, which noted generalized atrophy, supratentorial small vessel disease, and subacute right parieto-occipital PCA infarct.   Today: Here due to report of tachycardia on monitor. I attempted to access Danielle Vaughan to access the report, as the monitor was ordered through her primary care doctor. I reviewed his most recent office note, which mentions both ventricular ectopy and ventricular tachycardia, but I cannot see frequency/duration without the Zio. We requested the full report from Danielle Vaughan office, received and summarized below.   Zio monitor 12/06/19: 14 days. Min HR 42 bpm, max HR 138 bpm, avg 62 bpm. 1 run of NSVT of 4 beats, avg rate 115 bpm. 41 PSVT events, fastest was 8 beats at 138 bpm, longest was 13 beats at 106 bpm. PVC burden 15.8%. Rare atrial ectopy  No syncope. No presyncope. No chest pain or palpitations. No PND, orthopnea, LE edema, or unexpected weight gain.  ROS positive for occasional foggy vision after she eats.   Doesn't take BP every day, but has been running lower than usual. Was 118/80 at recent visit with Danielle Vaughan.  Was taken off aspirin by Danielle Vaughan, no issues with bruising or bleeding. CVA was seen on CT at prior neurology workup, she denies ever having symptoms. Not taking the keppra, reports hasn't taken in about 4-5 mos.  Denies any seizure events. Has follow up with neurology later this month. Is taking atorvastatin, no issues.  Past Medical History:  Diagnosis Date  . Hypertension   . Stroke [facility])     Past Surgical History:  Procedure Laterality Date  . PARTIAL HYSTERECTOMY      Current Medications: Current Outpatient Medications on File Prior to Visit  Medication Sig  . amLODipine (NORVASC) 5 MG tablet Take 5 mg by mouth at bedtime.   . ASPIRIN 81 PO Take 81 mg by mouth daily.  [name] atorvastatin (LIPITOR) 10 MG tablet Take 10 mg by mouth daily.  . benazepril (LOTENSIN) 40 MG tablet Take 40 mg by mouth at bedtime.   . levETIRAcetam (KEPPRA) 250 MG tablet Take 1 tablet (250 mg total) by mouth 2 (two) times daily.  . metoprolol tartrate (LOPRESSOR) 50 MG tablet Take 50 mg by mouth 2 (two) times daily.   No current facility-administered medications on file prior to visit.     Allergies:   Cefoxitin, Ceftin [cefuroxime axetil], and Codeine   Social History   Tobacco Use  . Smoking status: Never Smoker  . Smokeless tobacco: Never Used  Substance Use Topics  . Alcohol use: No  . Drug use: Never    Family History: family history includes Other in her father and mother. She was adopted.  ROS:   Please see the history of present illness.  Additional pertinent ROS otherwise unremarkable.  EKGs/Labs/Other Studies Reviewed:    The following studies were reviewed today: Echo 12/28/19  1. Left ventricular ejection fraction, by estimation, is 55 to 60%. Left  ventricular ejection fraction by 3D volume is 55 %. The left ventricle has  normal function. The left ventricle has no regional wall motion  abnormalities. There is moderate  asymmetric left ventricular hypertrophy of the basal-septal segment. Left  ventricular diastolic parameters are consistent with Grade II diastolic  dysfunction (pseudonormalization). Elevated left atrial pressure. The  average left ventricular global  longitudinal  strain is -18.0 %.  2. Right ventricular systolic function is normal. The right ventricular  size is normal. There is moderately elevated pulmonary artery systolic  pressure. The estimated right ventricular systolic pressure is 97.3 mmHg.  3. Left atrial size was severely dilated.  4. Right atrial size was mildly dilated.  5. The mitral valve is normal in structure. Mild mitral valve  regurgitation.  6. The aortic valve is tricuspid. Aortic valve regurgitation is not  visualized. No aortic stenosis is present.  7. The inferior vena cava is dilated in size with >50% respiratory  variability, suggesting right atrial pressure of 8 mmHg.   EKG:  EKG is personally reviewed.  The ekg ordered 12/09/19 demonstrates sinus rhythm with PVCs  Recent Labs: No results found for requested labs within last 8760 hours.  Recent Lipid Panel No results found for: CHOL, TRIG, HDL, CHOLHDL, VLDL, LDLCALC, LDLDIRECT  Physical Exam:    VS:  BP 115/70   Pulse 70   Temp (!) 94.1 F (34.5 C)   Ht 5' (1.524 m)   Wt 133 lb 12.8 oz (60.7 kg)   SpO2 92%   BMI 26.13 kg/m     Wt Readings from Last 3 Encounters:  12/09/19 132 lb (59.9 kg)  12/08/19 135 lb 8 oz (61.5 kg)  11/15/19 135 lb (61.2 kg)    GEN: Well nourished, well developed in no acute distress HEENT: Normal, moist mucous membranes NECK: No JVD CARDIAC: regular rhythm, normal S1 and S2, no rubs or gallops. No murmur. VASCULAR: Radial and DP pulses 2+ bilaterally. No carotid bruits RESPIRATORY:  Clear to auscultation without rales, wheezing or rhonchi  ABDOMEN: Soft, non-tender, non-distended MUSCULOSKELETAL:  Ambulates independently SKIN: Warm and dry, no edema NEUROLOGIC:  Alert and oriented x 3. No focal neuro deficits noted. PSYCHIATRIC:  Normal affect   ASSESSMENT:    1. History of CVA (cerebrovascular accident)   2. NSVT (nonsustained ventricular tachycardia) (HCC)   3. Paroxysmal SVT (supraventricular tachycardia) ([place])     4. Frequent PVCs   5. Essential hypertension   6. Hyperlipidemia, unspecified hyperlipidemia type   7. Cardiac risk counseling   8. Counseling on health promotion and disease prevention    PLAN:    History of stroke: -followed by Dr. [name] in neurology -CT with subacute right parieto-occipital PCA infarct, with generalized atrophy and small vessel changes -aspirin stopped by Danielle Vaughan. Defer to neurology and PCP on this. Given stroke, if there is no bleeding, I would recommend continuing -continue atorvastatin 10 mg -echo, monitor as above -CV risk factor modification, as below.  Nonsustained ventricular tachycardia, paroxysmal SVT, frequent PVCs per monitor: -she is asymptomatic -echo as above -already on metoprolol, continue  Hypertension: -continue amlodipine 5 mg daily -continue benazepril 40 mg daily -continue metoprolol tartrate 50 mg BID  Hyperlipidemia: -continue atorvastatin 10 mg daily  Cardiac risk counseling and prevention recommendations: -recommend heart healthy/Mediterranean diet, with whole grains, fruits, vegetable, fish, lean meats, nuts, and olive oil. Limit salt. -recommend moderate walking, 3-5 times/week for 30-50 minutes  each session. Aim for at least 150 minutes.week. Goal should be pace of 3 miles/hours, or walking 1.5 miles in 30 minutes -recommend avoidance of tobacco products. Avoid excess alcohol.   Plan for follow up: 1 year or sooner as needed  Danielle Dresser, MD, PhD [place]  [facility]    Medication Adjustments/Labs and Tests Ordered: Current medicines are reviewed at length with the patient today.  Concerns regarding medicines are outlined above.  No orders of the defined types were placed in this encounter.  No orders of the defined types were placed in this encounter.   Patient Instructions  Medication Instructions:  Your Physician recommend you continue on your current medication as directed.    *If you need  a refill on your cardiac medications before your next appointment, please call your pharmacy*   Lab Work: None   Testing/Procedures: None   Follow-Up: At [facility], you and your health needs are our priority.  As part of our continuing mission to provide you with exceptional heart care, we have created designated Provider Care Teams.  These Care Teams include your primary Cardiologist (physician) and Advanced Practice Providers (APPs -  Physician Assistants and Nurse Practitioners) who all work together to provide you with the care you need, when you need it.  We recommend signing up for the patient portal called "MyChart".  Sign up information is provided on this After Visit Summary.  MyChart is used to connect with patients for Virtual Visits (Telemedicine).  Patients are able to view lab/test results, encounter notes, upcoming appointments, etc.  Non-urgent messages can be sent to your provider as well.   To learn more about what you can do with MyChart, go to [email or web address].    Your next appointment:   1 year(s)  The format for your next appointment:   In Person  Provider:   Buford Dresser, MD       Signed, Danielle Dresser, MD PhD 04/06/2020  [place]

## 2020-04-24 ENCOUNTER — Encounter: Payer: Self-pay | Admitting: Neurology

## 2020-04-24 ENCOUNTER — Other Ambulatory Visit: Payer: Self-pay

## 2020-04-24 ENCOUNTER — Ambulatory Visit (INDEPENDENT_AMBULATORY_CARE_PROVIDER_SITE_OTHER): Payer: Medicare Other | Admitting: Neurology

## 2020-04-24 ENCOUNTER — Telehealth: Payer: Self-pay | Admitting: Neurology

## 2020-04-24 VITALS — BP 131/76 | HR 71 | Ht 60.0 in | Wt 133.0 lb

## 2020-04-24 DIAGNOSIS — H539 Unspecified visual disturbance: Secondary | ICD-10-CM

## 2020-04-24 DIAGNOSIS — I639 Cerebral infarction, unspecified: Secondary | ICD-10-CM

## 2020-04-24 NOTE — Telephone Encounter (Signed)
Please try to get most recent office note from PCP for review. [name].

## 2020-04-24 NOTE — Progress Notes (Signed)
PATIENT: Danielle Vaughan DOB: Jan 27, 1933  REASON FOR VISIT: follow up HISTORY FROM: patient  HISTORY OF PRESENT ILLNESS: Today 04/24/20  HISTORY Danielle Vaughan is a 86 year old female, seen in request by her primary care physician [name], [name] for evaluation of abnormal CT scan, she is accompanied by her daughter [name] at today's visit on November 15, 2019,  I have reviewed and summarized the referring note from the referring physician.  She had past medical history of hypertension, hyperlipidemia,  Since September 2020, she reported recurrent similar episode of blurry vision, right hand weakness following her evening meal on Sunday.  It happens intermittently,  She had a gradual decreased appetite over the years, but still active, able to have help with household chores, she tends to have smaller meal on Sunday, by evening time, when she finished her evening meal, she will often get blurry vision, right hand weakness, to the point of difficulty picking up her fork, symptoms last about 15 minutes, she denied loss of consciousness, denied increased gait abnormality, she has significant right knee pain is a candidate for right knee replacement, ambulate with a cane  For that reason, she was referred for CAT scan, I have personally reviewed CT head without contrast on November 11, 2019, generalized atrophy, supratentorium small vessel disease, subacute right parieto-occipital PCAs infarction  She was started on aspirin 81 mg daily, and Lipitor 10 mg every night since February 11  Update December 08, 2019: At previous visit, she complains of spells only at Sunday, but on further questioning, she reported intermittent spells couple times each week, sudden onset mild confusion, slow reaction, funny feelings, with some visual distortion, sometimes right arm numbness, heaviness.  She describes 1 episode on November 23, 2019, after visiting her son at nursing home, she was  sitting in the car with the bright sunshine, she suddenly felt funny, mild visual distortion, right arm heaviness, lasting 10 to 15 minutes, she denied loss of consciousness.  Her daughter also described, when she was having a spells, she often was noticed by days into space, but when she interact with patient, she was a bit slow to react, but able to answer questions  She was seen by cardiologist, had 2 weeks cardiac monitoring, visit and reports are pending  I personally reviewed MRI of the brain without contrast on November 19, 2019 from [facility], subacute infarction at the right occipital lobe, moderate supratentorium small vessel disease  Update April 24, 2020 SS: US carotid artery showed less than 39% stenosis bilaterally, recommended continue aspirin 81 mg daily.  Echocardiogram showed normal left ventricular function, moderate asymmetric left ventricular hypertrophy of the basal septal segment, right ventricle systolic click functions normal, left atrial size with severely dilated, advised to continue follow-up with cardiology.  Stopped aspirin, says PCP stopped, for unknown reason.  Continues to have bouts of blurry vision, usually around meal times, feels better once eating, sometimes weakness of the right hand, only notices because she eats right handed.  Does not have a great eating schedule, will wake up around 8, laying in the bed, not eat until 1 pm breakfast.  With spells, no other reported symptoms. Wore heart monitor, report is not available, said she only pushed the button once during spells.  Took Keppra for a few days, stopped, unknown reason.  Here today with her daughter [name].  REVIEW OF SYSTEMS: Out of a complete 14 system review of symptoms, the patient complains only of the following symptoms, and  all other reviewed systems are negative.  N/A  ALLERGIES: Allergies  Allergen Reactions   Cefoxitin    Ceftin [Cefuroxime Axetil] Other (See Comments)    Seeing lights    Codeine Other (See Comments)    Unknown    HOME MEDICATIONS: Outpatient Medications Prior to Visit  Medication Sig Dispense Refill   amLODipine (NORVASC) 5 MG tablet Take 5 mg by mouth at bedtime.      atorvastatin (LIPITOR) 10 MG tablet Take 10 mg by mouth daily.     benazepril (LOTENSIN) 40 MG tablet Take 40 mg by mouth at bedtime.      levETIRAcetam (KEPPRA) 250 MG tablet Take 1 tablet (250 mg total) by mouth 2 (two) times daily. 60 tablet 11   metoprolol tartrate (LOPRESSOR) 50 MG tablet Take 50 mg by mouth 2 (two) times daily. 100mg  1x a day     ASPIRIN 81 PO Take 81 mg by mouth daily.     No facility-administered medications prior to visit.    PAST MEDICAL HISTORY: Past Medical History:  Diagnosis Date   Hypertension    Stroke [facility])     PAST SURGICAL HISTORY: Past Surgical History:  Procedure Laterality Date   PARTIAL HYSTERECTOMY      FAMILY HISTORY: Family History  Adopted: Yes  Problem Relation Age of Onset   Other Mother        unsure of history   Other Father        unsure of history    SOCIAL HISTORY: Social History   Socioeconomic History   Marital status: Widowed    Spouse name: Not on file   Number of children: 6   Years of education: 10th grade   Highest education level: Not on file  Occupational History   Occupation: Retired  Tobacco Use   Smoking status: Never Smoker   Smokeless tobacco: Never Used  Substance and Sexual Activity   Alcohol use: No   Drug use: Never   Sexual activity: Never  Other Topics Concern   Not on file  Social History Narrative   Right-handed.   No daily use of caffeine.   She lives at home with three of her children (she has four living).   Social Determinants of Health   Financial Resource Strain:    Difficulty of Paying Living Expenses:   Food Insecurity:    Worried About Charity fundraiser in the Last Year:    Arboriculturist in the Last Year:   Transportation Needs:     Film/video editor (Medical):    Lack of Transportation (Non-Medical):   Physical Activity:    Days of Exercise per Week:    Minutes of Exercise per Session:   Stress:    Feeling of Stress :   Social Connections:    Frequency of Communication with Friends and Family:    Frequency of Social Gatherings with Friends and Family:    Attends Religious Services:    Active Member of Clubs or Organizations:    Attends Archivist Meetings:    Marital Status:   Intimate Partner Violence:    Fear of Current or Ex-Partner:    Emotionally Abused:    Physically Abused:    Sexually Abused:     PHYSICAL EXAM  Vitals:   04/24/20 1532  BP: (!) 131/76  Pulse: 71  Weight: 133 lb (60.3 kg)  Height: 5' (1.524 m)   Body mass index is 25.97 kg/m.  Generalized: Well developed,  in no acute distress  Neurological examination  Mentation: Alert oriented to time, place, history taking. Follows all commands speech and language fluent Cranial nerve II-XII: Pupils were equal round reactive to light. Extraocular movements were full, visual field were full on confrontational test. Facial sensation and strength were normal.  Head turning and shoulder shrug were normal and symmetric. Motor: The motor testing reveals 5 over 5 strength of all 4 extremities. Good symmetric motor tone is noted throughout.  Sensory: Sensory testing is intact to soft touch on all 4 extremities. No evidence of extinction is noted.  Coordination: Cerebellar testing reveals good finger-nose-finger and heel-to-shin bilaterally.  Gait and station: Gait is normal, but slight limp on the right due to knee Reflexes: Deep tendon reflexes are symmetric and normal bilaterally.   DIAGNOSTIC DATA (LABS, IMAGING, TESTING) - I reviewed patient records, labs, notes, testing and imaging myself where available.  Lab Results  Component Value Date   WBC 7.0 06/07/2017   HGB 12.1 06/07/2017   HCT 35.5 (L) 06/07/2017    MCV 90.6 06/07/2017   PLT 373 06/07/2017      Component Value Date/Time   NA 140 06/07/2017 1422   K 3.4 (L) 06/07/2017 1422   CL 107 06/07/2017 1422   CO2 21 (L) 06/07/2017 1422   GLUCOSE 97 06/07/2017 1422   BUN 18 06/07/2017 1422   CREATININE 0.94 06/07/2017 1422   CALCIUM 8.9 06/07/2017 1422   GFRNONAA 55 (L) 06/07/2017 1422   GFRAA >60 06/07/2017 1422   No results found for: CHOL, HDL, LDLCALC, LDLDIRECT, TRIG, CHOLHDL No results found for: HGBA1C No results found for: VITAMINB12 No results found for: TSH   ASSESSMENT AND PLAN 86 y.o. year old female  has a past medical history of Hypertension and Stroke ([place]). here with:  1.  Right occipital stroke -US carotid arteries showed less than 39% stenosis bilaterally -echocardiogram showed normal left ventricle function, moderate asymmetric left ventricular hypertrophy of the basal septal segment, right ventricle systolic click functions normal, left atrial size was severely dilated (seen cardiology) -Continue aspirin 81 mg daily, PCP discontinued this, unclear why, will try to get most recent office note to see reasoning, I would recommend given stroke HX  2.  Cardiac arrhythmia -wore Zio patch, report is not available (per cardiology note 7/8, with Dr. Harrell Gave, she reviewed PCP recent office note, mentions ventricular ectopy, ventricular tachycardia, but no frequency/duration, it appears the full report was requested, patient says she pushed the button during a few spells but not all the time)  3.  Recurrent spells of blurry vision, right hand weakness -Felt to represent probable partial seizure, but also mostly occurs in the setting of meal times, improves after eating, encouraged to develop consistent eating schedule with snacks, will reach out to cardiology, determine if they ever received full Zio patch report, determine if seizure or cardiac related? -Restart Keppra 250 mg twice a day -Document spells, follow-up in 6  months or sooner if needed  I spent 30 minutes of face-to-face and non-face-to-face time with patient.  This included previsit chart review, lab review, study review, order entry, electronic health record documentation, patient education.  Butler Denmark, AGNP-C, DNP 04/24/2020, 4:49 PM [facility] [address], [place], [place] [address] [phone]

## 2020-04-24 NOTE — Patient Instructions (Signed)
Discuss aspirin with PCP  I will check with cardiology about echo, cardiac monitor study Start Keppra 250 mg twice daily See you back in 6 months

## 2020-05-04 ENCOUNTER — Telehealth: Payer: Self-pay | Admitting: Neurology

## 2020-05-04 NOTE — Telephone Encounter (Signed)
I tried to call the patient, couldn't be reached. Please let them know.I received office note from January 28, 2020 from [facility], [name].  At that time, was referred to Dr. Harrell Gave, [facility] patch was performed on March 19, revealed some ventricular tachycardia, but was completely asymptomatic and did not correspond to any concerning episodes.  Aspirin 81 mg daily was listed on her medication list.  She was encouraged to take Keppra. I did reach out to cardiology, response is pending (per PCP note, Zio patch, did correspond to any concerning episodes). Would recommend Keppra and aspirin 81 mg daily.  From my last note: 3.  Recurrent spells of blurry vision, right hand weakness -Felt to represent probable partial seizure, but also mostly occurs in the setting of meal times, improves after eating, encouraged to develop consistent eating schedule with snacks, will reach out to cardiology, determine if they ever received full Zio patch report, determine if seizure or cardiac related? -Restart Keppra 250 mg twice a day

## 2020-05-08 NOTE — Telephone Encounter (Signed)
I LMVM for pt that was calling to check in her (see SS/NP note).

## 2020-05-09 NOTE — Telephone Encounter (Signed)
I sent note in mail to pt and family.

## 2020-05-09 NOTE — Telephone Encounter (Signed)
I called and LMVM for pt to family member to call back (this is 2nd message) concerning note from [name].

## 2020-05-10 NOTE — Telephone Encounter (Signed)
Pt called back.  I relayed that per SS/NP was recommended to take aspirin 81mg  po daily as well as the keppra 250mg  po bid.  She only took iniitally 3 days then stopped she was not sure why.  She will restart.  She will also call pcp about aspirin 81mg  daily as he took her off at one point.  I reiterated that SS/NP SS/NP recommendation.  She had a lot questions about her other medications as well. Referred to pcp to answer.  She will call back as needed.

## 2020-05-11 DIAGNOSIS — H8110 Benign paroxysmal vertigo, unspecified ear: Secondary | ICD-10-CM | POA: Diagnosis not present

## 2020-05-11 DIAGNOSIS — R569 Unspecified convulsions: Secondary | ICD-10-CM | POA: Diagnosis not present

## 2020-05-14 ENCOUNTER — Other Ambulatory Visit: Payer: Self-pay

## 2020-05-14 ENCOUNTER — Ambulatory Visit (HOSPITAL_COMMUNITY)
Admission: EM | Admit: 2020-05-14 | Discharge: 2020-05-14 | Disposition: A | Discharge status: Home / Self Care | Payer: Medicare Other | Attending: Family Medicine | Admitting: Family Medicine

## 2020-05-14 ENCOUNTER — Encounter (HOSPITAL_COMMUNITY): Payer: Self-pay | Admitting: Emergency Medicine

## 2020-05-14 ENCOUNTER — Ambulatory Visit (INDEPENDENT_AMBULATORY_CARE_PROVIDER_SITE_OTHER): Payer: Medicare Other

## 2020-05-14 DIAGNOSIS — S0992XA Unspecified injury of nose, initial encounter: Secondary | ICD-10-CM

## 2020-05-14 DIAGNOSIS — S022XXA Fracture of nasal bones, initial encounter for closed fracture: Secondary | ICD-10-CM | POA: Diagnosis not present

## 2020-05-14 MED ORDER — ACETAMINOPHEN 325 MG PO TABS: 650.0000 mg | ORAL_TABLET | Freq: Once | ORAL | Status: DC

## 2020-05-14 NOTE — Discharge Instructions (Addendum)
Ice to area to reduce swelling Take Tylenol for pain Call [facility] ear nose and throat tomorrow for a follow-up appointment to check on healing

## 2020-05-14 NOTE — ED Triage Notes (Signed)
Pt sts she tripped over something and hit her nose. C/o of pain to nose, bleeding stopped. Pt uses cane.

## 2020-05-14 NOTE — ED Provider Notes (Signed)
[facility]    CSN: [id number] Arrival date & time: 05/14/20  1451      History   Chief Complaint Chief Complaint  Patient presents with   Fall    HPI Danielle Vaughan is a 86 y.o. female.   HPI  Patient fell in her kitchen this morning.  She tripped over a box on the floor and fell forward.  She reached out to catch herself with her hands but unfortunately her face had a stepstool that was in the way.  She hit her nose on the edge.  She had some nosebleeding at the time.  Her nose is swollen and painful.  She is otherwise uninjured.  No loss of consciousness.  Family was with her at the time.  Past Medical History:  Diagnosis Date   Hypertension    Stroke [facility])     Patient Active Problem List   Diagnosis Date Noted   History of CVA (cerebrovascular accident) 01/27/2020   Occipital stroke ([place]) 12/08/2019   Visual changes 12/08/2019   Right hand weakness 11/15/2019   Abnormal CT of the head 11/15/2019    Past Surgical History:  Procedure Laterality Date   PARTIAL HYSTERECTOMY      OB History   No obstetric history on file.      Home Medications    Prior to Admission medications   Medication Sig Start Date End Date Taking? Authorizing Provider  amLODipine (NORVASC) 5 MG tablet Take 5 mg by mouth at bedtime.    Yes [provider]  atorvastatin (LIPITOR) 10 MG tablet Take 10 mg by mouth daily. 11/11/19  Yes [provider]  benazepril (LOTENSIN) 40 MG tablet Take 40 mg by mouth at bedtime.    Yes [provider]  levETIRAcetam (KEPPRA) 250 MG tablet Take 1 tablet (250 mg total) by mouth 2 (two) times daily. 12/08/19  Yes Marcial Pacas, MD  metoprolol tartrate (LOPRESSOR) 50 MG tablet Take 50 mg by mouth 2 (two) times daily. 100mg  1x a day   Yes [provider]  aspirin EC 81 MG tablet Take 81 mg by mouth daily. Swallow whole.    [provider]    Family History Family History  Adopted: Yes    Problem Relation Age of Onset   Other Mother        unsure of history   Other Father        unsure of history    Social History Social History   Tobacco Use   Smoking status: Never Smoker   Smokeless tobacco: Never Used  Substance Use Topics   Alcohol use: No   Drug use: Never     Allergies   Cefoxitin, Ceftin [cefuroxime axetil], and Codeine   Review of Systems Review of Systems  See HPI Physical Exam Triage Vital Signs ED Triage Vitals  Enc Vitals Group     BP 05/14/20 1528 136/83     Pulse Rate 05/14/20 1528 71     Resp 05/14/20 1528 16     Temp 05/14/20 1528 97.8 F (36.6 C)     Temp Source 05/14/20 1528 Oral     SpO2 05/14/20 1528 100 %     Weight --      Height --      Head Circumference --      Peak Flow --      Pain Score 05/14/20 1525 0     Pain Loc --      Pain Edu? --  Excl. in GC? --    No data found.  Updated Vital Signs BP 136/83 (BP Location: Right Arm)    Pulse 71    Temp 97.8 F (36.6 C) (Oral)    Resp 16    SpO2 100%      Physical Exam Constitutional:      General: She is not in acute distress.    Appearance: She is well-developed and normal weight.  HENT:     Head: Normocephalic and atraumatic.     Nose:     Comments: Mild soft tissue swelling of nasal bridge, no palpable defect but the distal nose is hyper mobile  Eyes:     Conjunctiva/sclera: Conjunctivae normal.     Pupils: Pupils are equal, round, and reactive to light.  Cardiovascular:     Rate and Rhythm: Normal rate.  Pulmonary:     Effort: Pulmonary effort is normal. No respiratory distress.  Abdominal:     General: There is no distension.     Palpations: Abdomen is soft.  Musculoskeletal:        General: Normal range of motion.     Cervical back: Normal range of motion.  Skin:    General: Skin is warm and dry.  Neurological:     Mental Status: She is alert.     Gait: Gait abnormal.     Comments: Cane.  Psychiatric:        Mood and Affect: Mood  normal.        Behavior: Behavior normal.      UC Treatments / Results  Labs (all labs ordered are listed, but only abnormal results are displayed) Labs Reviewed - No data to display  EKG   Radiology DG Nasal Bones  Result Date: 05/14/2020 CLINICAL DATA:  Status post trauma. EXAM: NASAL BONES - 3+ VIEW COMPARISON:  None. FINDINGS: A very small, very mildly displaced nasal bone fracture is seen along the bridge of the nasal bone. This is best seen on the left lateral view. IMPRESSION: Very small, very mildly displaced nasal bone fracture. Electronically Signed   By: Virgina Norfolk M.D.   On: 05/14/2020 16:30    Procedures Procedures (including critical care time)  Medications Ordered in UC Medications  acetaminophen (TYLENOL) tablet 650 mg (has no administration in time range)    Initial Impression / Assessment and Plan / UC Course  I have reviewed the triage vital signs and the nursing notes.  Pertinent labs & imaging results that were available during my care of the patient were reviewed by me and considered in my medical decision making (see chart for details).     follow up ENT Final Clinical Impressions(s) / UC Diagnoses   Final diagnoses:  Closed fracture of nasal bone, initial encounter     Discharge Instructions     Ice to area to reduce swelling Take Tylenol for pain Call [facility] ear nose and throat tomorrow for a follow-up appointment to check on healing    ED Prescriptions    None     PDMP not reviewed this encounter.   Raylene Everts, MD 05/14/20 [phone]

## 2020-05-19 DIAGNOSIS — S022XXD Fracture of nasal bones, subsequent encounter for fracture with routine healing: Secondary | ICD-10-CM | POA: Diagnosis not present

## 2020-05-31 DIAGNOSIS — I10 Essential (primary) hypertension: Secondary | ICD-10-CM | POA: Diagnosis not present

## 2020-05-31 DIAGNOSIS — D473 Essential (hemorrhagic) thrombocythemia: Secondary | ICD-10-CM | POA: Diagnosis not present

## 2020-05-31 DIAGNOSIS — R7303 Prediabetes: Secondary | ICD-10-CM | POA: Diagnosis not present

## 2020-05-31 DIAGNOSIS — E782 Mixed hyperlipidemia: Secondary | ICD-10-CM | POA: Diagnosis not present

## 2020-06-06 DIAGNOSIS — M7541 Impingement syndrome of right shoulder: Secondary | ICD-10-CM | POA: Diagnosis not present

## 2020-06-07 DIAGNOSIS — I10 Essential (primary) hypertension: Secondary | ICD-10-CM | POA: Diagnosis not present

## 2020-06-07 DIAGNOSIS — N182 Chronic kidney disease, stage 2 (mild): Secondary | ICD-10-CM | POA: Diagnosis not present

## 2020-06-07 DIAGNOSIS — R7303 Prediabetes: Secondary | ICD-10-CM | POA: Diagnosis not present

## 2020-06-07 DIAGNOSIS — E782 Mixed hyperlipidemia: Secondary | ICD-10-CM | POA: Diagnosis not present

## 2020-06-14 ENCOUNTER — Encounter: Payer: Self-pay | Admitting: Cardiology

## 2020-06-14 DIAGNOSIS — I471 Supraventricular tachycardia, unspecified: Secondary | ICD-10-CM | POA: Insufficient documentation

## 2020-06-14 DIAGNOSIS — I4729 Other ventricular tachycardia: Secondary | ICD-10-CM | POA: Insufficient documentation

## 2020-06-14 DIAGNOSIS — I493 Ventricular premature depolarization: Secondary | ICD-10-CM | POA: Insufficient documentation

## 2020-06-28 ENCOUNTER — Telehealth: Payer: Self-pay | Admitting: Neurology

## 2020-06-28 NOTE — Telephone Encounter (Signed)
Attempted to call pt, LVM for call back  Will try to call back soon

## 2020-06-28 NOTE — Telephone Encounter (Signed)
[place], Research officer, trade [employer] on [employer]) is asking for a call to discuss frequent episodes that pt has  When her vision gets blury and she is in a state of not being able to focus.  Daughter states these last about 10-15 mins.  Please call.

## 2020-06-28 NOTE — Telephone Encounter (Signed)
Daughter stated pt is not taking Keppra 250 twice daily consistently, states she reminds her but pt forgets.  Spoke to SS/NP  Per NP, Informed daughter that pt should resume Keppra as prescribed  Appt with NP made and daughter will have report from  PCP and cardiology faxed to office   Daughter understands to go to ED if a neurological event occurs   Daughter voiced understanding

## 2020-06-28 NOTE — Telephone Encounter (Signed)
Spoke to pt's daughter  States pt had a fall 3 weeks ago, fell and hit her face, Since then memory has been worse. States for the last week pt has had more staring episodes where she stops speaking, reports blurry vision, and numbness in right arm, daughter states this will last for 15 mins and can happen 1-2 times a day

## 2020-06-28 NOTE — Telephone Encounter (Signed)
Reviewed chart, recurrent spells of blurry vision, right hand weakness, felt to represent probable partial seizure, mostly occurs in the setting of meals, make sure she is taking Keppra 250 mg twice daily. If so, still spells, we could try higher dose 500 mg twice daily. I never heard back about report of cardiac monitor, did they hear anything different?

## 2020-06-29 NOTE — Telephone Encounter (Signed)
Daughter is asking for a call back to further discuss pt's medication(Keppra).  Please call

## 2020-06-29 NOTE — Telephone Encounter (Signed)
Spoke to daughter, she called back again since there was an old message on home VM. NO updates needed

## 2020-07-04 DIAGNOSIS — M25511 Pain in right shoulder: Secondary | ICD-10-CM | POA: Diagnosis not present

## 2020-07-17 ENCOUNTER — Encounter: Payer: Self-pay | Admitting: Neurology

## 2020-07-17 ENCOUNTER — Ambulatory Visit (INDEPENDENT_AMBULATORY_CARE_PROVIDER_SITE_OTHER): Payer: Medicare Other | Admitting: Neurology

## 2020-07-17 VITALS — BP 139/82 | HR 66 | Ht 60.0 in | Wt 134.8 lb

## 2020-07-17 DIAGNOSIS — H539 Unspecified visual disturbance: Secondary | ICD-10-CM

## 2020-07-17 DIAGNOSIS — I639 Cerebral infarction, unspecified: Secondary | ICD-10-CM

## 2020-07-17 MED ORDER — LEVETIRACETAM ER 500 MG PO TB24
500.0000 mg | ORAL_TABLET | Freq: Every day | ORAL | 5 refills | Status: DC
Start: 2020-07-17 — End: 2020-10-30

## 2020-07-17 NOTE — Patient Instructions (Signed)
We will switch to Keppra extended release 500 mg at bedtime Take it every night at the same time Continue taking aspirin 81 mg daily Document spells Try to eat consistent meals daily Keep appointment with Dr. [name] in January

## 2020-07-17 NOTE — Progress Notes (Addendum)
PATIENT: Danielle Vaughan DOB: [birth year]  REASON FOR VISIT: follow up HISTORY FROM: patient  HISTORY OF PRESENT ILLNESS: Today 07/17/20  HISTORY  Danielle Wendt Miltonis a 86 year old female, seen in request by her primary care physician [name], [name] for evaluation of abnormal CT scan, she is accompanied by her daughter [name] at today's visit on November 15, 2019,  I have reviewed and summarized the referring note from the referring physician. She had past medical history of hypertension, hyperlipidemia,  Since September 2020, she reported recurrent similar episode of blurry vision, right hand weakness following her evening meal on Sunday. It happens intermittently,  She had a gradual decreased appetite over the years, but still active, able to have help with household chores, she tends to have smaller meal on Sunday, by evening time, when she finished her evening meal, she will often get blurry vision, right hand weakness, to the point of difficulty picking up her fork, symptoms last about 15 minutes, she denied loss of consciousness, denied increased gait abnormality, she has significant right knee pain is a candidate for right knee replacement, ambulate with a cane  For that reason, she was referred for CAT scan, I have personally reviewed CT head without contrast on November 11, 2019, generalized atrophy, supratentorium small vessel disease, subacute right parieto-occipital PCAs infarction  She was started on aspirin 81 mg daily, and Lipitor 10 mg every night since February 11  Update December 08, 2019: At previous visit, she complains of spells only at Sunday, but on further questioning, she reported intermittent spells couple times each week, sudden onset mild confusion, slow reaction, funny feelings, with some visual distortion, sometimes right arm numbness, heaviness. She describes 1 episode on November 23, 2019, after visiting her son at nursing home, she was  sitting in the car with the bright sunshine, she suddenly felt funny, mild visual distortion, right arm heaviness, lasting 10 to 15 minutes, she denied loss of consciousness. Her daughter also described, when she was having a spells, she often was noticed by days into space, but when she interact with patient, she was a bit slow to react, but able to answer questions  She was seen by cardiologist, had 2 weeks cardiac monitoring, visit and reports are pending  I personally reviewed MRI of the brain without contrast on November 19, 2019 from [facility], subacute infarction at the right occipital lobe, moderate supratentorium small vessel disease  Update April 24, 2020 SS: US carotid artery showed less than 39% stenosis bilaterally, recommended continue aspirin 81 mg daily.  Echocardiogram showed normal left ventricular function, moderate asymmetric left ventricular hypertrophy of the basal septal segment, right ventricle systolic click functions normal, left atrial size with severely dilated, advised to continue follow-up with cardiology.  Stopped aspirin, says PCP stopped, for unknown reason.  Continues to have bouts of blurry vision, usually around meal times, feels better once eating, sometimes weakness of the right hand, only notices because she eats right handed.  Does not have a great eating schedule, will wake up around 8, laying in the bed, not eat until 1 pm breakfast.  With spells, no other reported symptoms. Wore heart monitor, report is not available, said she only pushed the button once during spells.  Took Keppra for a few days, stopped, unknown reason.  Here today with her daughter [name].  Update July 17, 2020 SS: Suffered a mechanical fall in August, with nasal fracture.  Is on aspirin 81 mg daily.  Continues to have  spells suspicious for partial seizure, starts as blurry vision, confusion, weakness of the right hand, mostly around mealtimes, but could be other times (example: ate  9 am, went to farmers market, spell at 11).  Last 10 minutes and back to normal, has not taken the Keppra. Spells could be a few times a week, or go a few weeks without. Does have irregular schedule daily, sleeps late, may not eat breakfast until 12 pm.  Per cardiology note with Dr. Harrell Gave 04/06/20, nonsustained V. tach, proximal SVT, frequent PVCs per monitor, was asymptomatic, already on metoprolol.  MMSE 26/30 today.,  Here with Glenda. Danielle Vaughan is very sharp, and good historian, knowledgeable of her medicines.  REVIEW OF SYSTEMS: Out of a complete 14 system review of symptoms, the patient complains only of the following symptoms, and all other reviewed systems are negative.  Blurry vision  ALLERGIES: Allergies  Allergen Reactions  . Cefoxitin   . Ceftin [Cefuroxime Axetil] Other (See Comments)    Seeing lights  . Codeine Other (See Comments)    Unknown    HOME MEDICATIONS: Outpatient Medications Prior to Visit  Medication Sig Dispense Refill  . amLODipine (NORVASC) 5 MG tablet Take 5 mg by mouth at bedtime.     [name] aspirin EC 81 MG tablet Take 81 mg by mouth daily. Swallow whole.    [name] atorvastatin (LIPITOR) 10 MG tablet Take 10 mg by mouth daily.    . benazepril (LOTENSIN) 40 MG tablet Take 40 mg by mouth at bedtime.     . levETIRAcetam (KEPPRA) 250 MG tablet Take 1 tablet (250 mg total) by mouth 2 (two) times daily. 60 tablet 11  . metoprolol tartrate (LOPRESSOR) 50 MG tablet Take 50 mg by mouth 2 (two) times daily. 100mg  1x a day     No facility-administered medications prior to visit.    PAST MEDICAL HISTORY: Past Medical History:  Diagnosis Date  . Hypertension   . Stroke [facility])     PAST SURGICAL HISTORY: Past Surgical History:  Procedure Laterality Date  . PARTIAL HYSTERECTOMY      FAMILY HISTORY: Family History  Adopted: Yes  Problem Relation Age of Onset  . Other Mother        unsure of history  . Other Father        unsure of history    SOCIAL  HISTORY: Social History   Socioeconomic History  . Marital status: Widowed    Spouse name: Not on file  . Number of children: 6  . Years of education: 10th grade  . Highest education level: Not on file  Occupational History  . Occupation: Retired  Tobacco Use  . Smoking status: Never Smoker  . Smokeless tobacco: Never Used  Substance and Sexual Activity  . Alcohol use: No  . Drug use: Never  . Sexual activity: Never  Other Topics Concern  . Not on file  Social History Narrative   Right-handed.   No daily use of caffeine.   She lives at home with three of her children (she has four living).   Social Determinants of Health   Financial Resource Strain:   . Difficulty of Paying Living Expenses: Not on file  Food Insecurity:   . Worried About Charity fundraiser in the Last Year: Not on file  . Ran Out of Food in the Last Year: Not on file  Transportation Needs:   . Lack of Transportation (Medical): Not on file  . Lack of Transportation (Non-Medical): Not on  file  Physical Activity:   . Days of Exercise per Week: Not on file  . Minutes of Exercise per Session: Not on file  Stress:   . Feeling of Stress : Not on file  Social Connections:   . Frequency of Communication with Friends and Family: Not on file  . Frequency of Social Gatherings with Friends and Family: Not on file  . Attends Religious Services: Not on file  . Active Member of Clubs or Organizations: Not on file  . Attends Archivist Meetings: Not on file  . Marital Status: Not on file  Intimate Partner Violence:   . Fear of Current or Ex-Partner: Not on file  . Emotionally Abused: Not on file  . Physically Abused: Not on file  . Sexually Abused: Not on file    PHYSICAL EXAM  Vitals:   07/17/20 0859  BP: 139/82  Pulse: 66  Weight: 134 lb 12.8 oz (61.1 kg)  Height: 5' (1.524 m)   Body mass index is 26.33 kg/m.  Generalized: Well developed, in no acute distress  MMSE - Mini Mental State  Exam 07/17/2020  Orientation to time 4  Orientation to Place 5  Registration 3  Attention/ Calculation 3  Recall 3  Language- name 2 objects 2  Language- repeat 1  Language- follow 3 step command 3  Language- read & follow direction 1  Write a sentence 1  Copy design 0  Total score 26    Neurological examination  Mentation: Alert oriented to time, place, history taking. Follows all commands speech and language fluent Cranial nerve II-XII: Pupils were equal round reactive to light. Extraocular movements were full, visual field were full on confrontational test. Facial sensation and strength were normal. Head turning and shoulder shrug  were normal and symmetric. Motor: Good strength to all extremities. Sensory: Sensory testing is intact to soft touch on all 4 extremities. No evidence of extinction is noted.  Coordination: Cerebellar testing reveals good finger-nose-finger and heel-to-shin bilaterally.  Gait and station: Has to push off from seated position, gait is wide-based, antalgic on the right, uses single-point cane Reflexes: Deep tendon reflexes are symmetric but depressed throughout.  DIAGNOSTIC DATA (LABS, IMAGING, TESTING) - I reviewed patient records, labs, notes, testing and imaging myself where available.  Lab Results  Component Value Date   WBC 7.0 06/07/2017   HGB 12.1 06/07/2017   HCT 35.5 (L) 06/07/2017   MCV 90.6 06/07/2017   PLT 373 06/07/2017      Component Value Date/Time   NA 140 06/07/2017 1422   K 3.4 (L) 06/07/2017 1422   CL 107 06/07/2017 1422   CO2 21 (L) 06/07/2017 1422   GLUCOSE 97 06/07/2017 1422   BUN 18 06/07/2017 1422   CREATININE 0.94 06/07/2017 1422   CALCIUM 8.9 06/07/2017 1422   GFRNONAA 55 (L) 06/07/2017 1422   GFRAA >60 06/07/2017 1422   No results found for: CHOL, HDL, LDLCALC, LDLDIRECT, TRIG, CHOLHDL No results found for: HGBA1C No results found for: VITAMINB12 No results found for: TSH  ASSESSMENT AND PLAN 86 y.o. year  old female  has a past medical history of Hypertension and Stroke ([place]). here with:  1.  Right occipital stroke -US carotid arteries showed less than 39% stenosis bilaterally -echocardiogram showed normal left ventricle function, moderate asymmetric left ventricular hypertrophy of the basal septal segment, right ventricle systolic click functions normal, left atrial size was severely dilated (seeing cardiology) -Continue aspirin 81 mg daily  2.  Cardiac arrhythmia -  Per cards note nonsustained VTAC, paroxysmal SVT, frequent PVC -On metoprolol, amlodipine, Benzapril, atorvastatin  3.  Recurrent spells of blurry vision, right hand weakness -Felt to most likely represent probable partial seizure -Needs to give Keppra good try, will try Keppra XR 500 mg daily, since twice daily had poor compliance, never took it  -Document all spells -Order EEG  -Spells usually occur around meal time, make sure eating regularly, try to get on consistent schedule -Keep follow-up appointment in January with Dr. [name]  I spent 30 minutes of face-to-face and non-face-to-face time with patient.  This included previsit chart review, lab review, study review, order entry, electronic health record documentation, patient education.  Butler Denmark, AGNP-C, DNP 07/17/2020, 9:24 AM [facility] [address], [place], [place] [address] [phone]  Addendum: Chart reviewed, history of right PCA stroke, encephalomalacia at right occipital lobe, described stereotypical episode is suggestive of partial seizure, agree Keppra XR 500 mg daily

## 2020-07-24 ENCOUNTER — Ambulatory Visit (INDEPENDENT_AMBULATORY_CARE_PROVIDER_SITE_OTHER): Payer: Medicare Other | Admitting: Neurology

## 2020-07-24 DIAGNOSIS — R2990 Unspecified symptoms and signs involving the nervous system: Secondary | ICD-10-CM

## 2020-07-24 DIAGNOSIS — I639 Cerebral infarction, unspecified: Secondary | ICD-10-CM

## 2020-07-26 DIAGNOSIS — N182 Chronic kidney disease, stage 2 (mild): Secondary | ICD-10-CM | POA: Diagnosis not present

## 2020-07-26 DIAGNOSIS — R7303 Prediabetes: Secondary | ICD-10-CM | POA: Diagnosis not present

## 2020-07-26 DIAGNOSIS — I10 Essential (primary) hypertension: Secondary | ICD-10-CM | POA: Diagnosis not present

## 2020-07-26 NOTE — Procedures (Signed)
    History:  Danielle Vaughan is an 86 year old patient with a history of a right occipital stroke in the past.  The patient has had recurring episodes of blurry vision and right hand weakness.  The patient is being evaluated for possible partial seizure event.  This is a routine EEG.  No skull defects are noted.  Medications include amlodipine, Lipitor, Lotensin, Keppra, Lopressor, and aspirin.  EEG classification: Normal awake  Description of the recording: The background rhythms of this recording consists of a fairly well modulated medium amplitude alpha rhythm of 11 Hz that is reactive to eye opening and closure. As the record progresses, the patient appears to remain in the waking state throughout the recording. Photic stimulation was performed, resulting in a bilateral and symmetric photic driving response. Hyperventilation was also performed, resulting in a minimal buildup of the background rhythm activities without significant slowing seen. At no time during the recording does there appear to be evidence of spike or spike wave discharges or evidence of focal slowing. EKG monitor shows occasional premature ventricular contractions with a heart rate of 66.  Impression: This is a normal EEG recording in the waking state. No evidence of ictal or interictal discharges are seen.

## 2020-08-02 DIAGNOSIS — N182 Chronic kidney disease, stage 2 (mild): Secondary | ICD-10-CM | POA: Diagnosis not present

## 2020-08-02 DIAGNOSIS — R7303 Prediabetes: Secondary | ICD-10-CM | POA: Diagnosis not present

## 2020-08-02 DIAGNOSIS — I10 Essential (primary) hypertension: Secondary | ICD-10-CM | POA: Diagnosis not present

## 2020-08-02 DIAGNOSIS — E782 Mixed hyperlipidemia: Secondary | ICD-10-CM | POA: Diagnosis not present

## 2020-09-13 ENCOUNTER — Telehealth: Payer: Self-pay | Admitting: Neurology

## 2020-09-13 NOTE — Telephone Encounter (Signed)
I spoke to daughter and Danielle Vaughan is having after she eats ?? ffuzzy vision, R hand weakness/ numbness.  Lasts for 10-15 mins.  Danielle Vaughan has not bee compliant with taking her keppra (not taken for the last week regularly).  Spells have been more frequent.  Danielle Vaughan concerned about SE, suicide thoughts. ( I relayed that is rare).  All meds have SE, but this one is usually very well tolerated.  They have not kept record of spells.  I relayed she is able to take with or without food , take either am or pm which ever you feel like she would be more consistent.  She can call pharmacy about taking with her other medications, as they are experts with medications.  I would relay to SS/NP. If any changes will let her know.  She appreciated call and information.  Call time 27 min.

## 2020-09-13 NOTE — Telephone Encounter (Signed)
Pt's daughter Danielle Vaughan on [place] called wanting to speak to RN regarding pt's symptoms. She states that usually after the pt eats she starts getting foggy/blurred vision and Glenda would like to discuss this. Please advise.

## 2020-09-14 NOTE — Telephone Encounter (Signed)
I would encourage her to try taking the medication consistently, as mentioned, the spells have been more frequent when not taking.  I switched her to Keppra XR 500 mg once daily to make it easier for her.  Her symptoms are suggestive of partial seizure.

## 2020-09-14 NOTE — Telephone Encounter (Signed)
I called daughter and relayed SS/NP note.  Daughter did speak to her mother and explained and she was receptive of taking the medication daily.  She will cal [place] back as needed.

## 2020-10-02 ENCOUNTER — Telehealth: Payer: Self-pay | Admitting: Neurology

## 2020-10-02 NOTE — Telephone Encounter (Signed)
I spoke to daughter of pt.  Since last spoke to her back on 09-13-20. Pt has taken, but due to feeling woozy she stopped (not consistent).  Even not taking levetiracetam she did have episodes of feeling weak, hand numbness/ red palms. Last 15 minutes. (could have 1-2 episodes daily).   The last 2 days has a taken keppra, at 2130 had episode feeling weak , hand numbness/palm red. (Saturday dinner blood sugar 167 per daughter), then again today after eating late breakfast.  Apparently does not matter if she takes or not.  (although pt is less likely to take when she feels like this).  Please advise. Not sure if med or something else.

## 2020-10-02 NOTE — Telephone Encounter (Signed)
This sounds the same thing we have been dealing with, felt to be partial seizure, would recommend continue the Keppra everyday, not to miss any doses. Keep a log of events, see if are less when on Keppra consistently. Seeing Dr. Terrace Arabia in a few week, be consistent with medication so Dr. Terrace Arabia can get a good idea of what is helping. See PCP is any new symptoms.

## 2020-10-02 NOTE — Telephone Encounter (Signed)
Appt has been added to the cancellation/wait list.

## 2020-10-02 NOTE — Telephone Encounter (Signed)
Daughter(on DPR) left voicemail stating there is an increase and worsening of sick spells .  Daughter stated they generally always occur after pt has eaten.  Pt takes the medication and feels funny in her head.  Pt has had another episode today, please call daughter.

## 2020-10-02 NOTE — Telephone Encounter (Signed)
Spoke to daughter.  I relayed that consistency with taking the keppra (been on this since earlier this year). Episodes related to partial seizures?  (why after eating??) I could not say.  Has not been consistent due to feeling woozy when she takes this.  Also the hand numbness/red palm/ weakness after eating.  Believed to be partial sz.  Taking the medication consistently will tell us if is sz,  but since not consistent then hard to say if helping or not.  Daughter asking about type sz, side effect of keppra. I relayed that has appt with Dr. Terrace Arabia keep appt and log. If cancellation see earlier? Will forward to MK/RN for Dr Terrace Arabia.

## 2020-10-09 DIAGNOSIS — M170 Bilateral primary osteoarthritis of knee: Secondary | ICD-10-CM | POA: Diagnosis not present

## 2020-10-13 ENCOUNTER — Other Ambulatory Visit: Payer: Medicare Other

## 2020-10-13 DIAGNOSIS — Z20822 Contact with and (suspected) exposure to covid-19: Secondary | ICD-10-CM

## 2020-10-15 LAB — SARS-COV-2, NAA 2 DAY TAT

## 2020-10-15 LAB — NOVEL CORONAVIRUS, NAA: SARS-CoV-2, NAA: DETECTED — AB

## 2020-10-16 ENCOUNTER — Telehealth: Payer: Self-pay | Admitting: Adult Health

## 2020-10-16 NOTE — Telephone Encounter (Signed)
Called to discuss with patient about COVID-19 symptoms and the use of one of the available treatments for those with mild to moderate Covid symptoms and at a high risk of hospitalization.  Pt appears to qualify for outpatient treatment due to co-morbid conditions and/or a member of an at-risk group in accordance with the FDA Emergency Use Authorization.      Unable to reach pt - LMOM   Lindsey C Causey   

## 2020-10-17 ENCOUNTER — Ambulatory Visit: Payer: Medicare Other

## 2020-10-30 ENCOUNTER — Ambulatory Visit (INDEPENDENT_AMBULATORY_CARE_PROVIDER_SITE_OTHER): Payer: Medicare Other | Admitting: Neurology

## 2020-10-30 ENCOUNTER — Encounter: Payer: Self-pay | Admitting: Neurology

## 2020-10-30 VITALS — BP 131/77 | HR 73 | Ht 60.0 in | Wt 132.0 lb

## 2020-10-30 DIAGNOSIS — I639 Cerebral infarction, unspecified: Secondary | ICD-10-CM

## 2020-10-30 DIAGNOSIS — R930 Abnormal findings on diagnostic imaging of skull and head, not elsewhere classified: Secondary | ICD-10-CM

## 2020-10-30 DIAGNOSIS — R29898 Other symptoms and signs involving the musculoskeletal system: Secondary | ICD-10-CM | POA: Diagnosis not present

## 2020-10-30 MED ORDER — LEVETIRACETAM ER 500 MG PO TB24
500.0000 mg | ORAL_TABLET | Freq: Every day | ORAL | 4 refills | Status: DC
Start: 2020-10-30 — End: 2021-08-30

## 2020-10-30 NOTE — Progress Notes (Signed)
HISTORY OF PRESENT ILLNESS: Danielle Vaughan a 87 year old female, seen in request by her primary care physician [name], [name] for evaluation of abnormal CT scan, she is accompanied by her daughter [name] at today's visit on November 15, 2019,  I have reviewed and summarized the referring note from the referring physician. She had past medical history of hypertension, hyperlipidemia,  Since September 2020, she reported recurrent similar episode of blurry vision, right hand weakness following her evening meal on Sunday. It happens intermittently,  She had a gradual decreased appetite over the years, but still active, able to have help with household chores, she tends to have smaller meal on Sunday, by evening time, when she finished her evening meal, she will often get blurry vision, right hand weakness, to the point of difficulty picking up her fork, symptoms last about 15 minutes, she denied loss of consciousness, denied increased gait abnormality, she has significant right knee pain is a candidate for right knee replacement, ambulate with a cane  For that reason, she was referred for CAT scan, I have personally reviewed CT head without contrast on November 11, 2019, generalized atrophy, supratentorium small vessel disease, subacute right parieto-occipital PCAs infarction  She was started on aspirin 81 mg daily, and Lipitor 10 mg every night since February 11  Update December 08, 2019: At previous visit, she complains of spells only at Sunday, but on further questioning, she reported intermittent spells couple times each week, sudden onset mild confusion, slow reaction, funny feelings, with some visual distortion, sometimes right arm numbness, heaviness. She describes 1 episode on November 23, 2019, after visiting her son at nursing home, she was sitting in the car with the bright sunshine, she suddenly felt funny, mild visual distortion, right arm heaviness, lasting 10 to 15  minutes, she denied loss of consciousness. Her daughter also described, when she was having a spells, she often was noticed by days into space, but when she interact with patient, she was a bit slow to react, but able to answer questions  She was seen by cardiologist, had 2 weeks cardiac monitoring, visit and reports are pending  I personally reviewed MRI of the brain without contrast on November 19, 2019 from [facility], subacute infarction at the right occipital lobe, moderate supratentorium small vessel disease  US carotid artery showed less than 39% stenosis bilaterally Echocardiogram showed normal left ventricular function, moderate asymmetric left ventricular hypertrophy of the basal septal segment, right ventricle systolic click functions normal, left atrial size with severely dilated  UPDATE Oct 30 2020: She is accompanied by her daughter going to today's clinical visit, she has not been compliant with her Keppra xr 500 mg every night, daughter reported that with Keppra, she is doing better, had much less spells of sudden onset blurry vision, right hand weakness, confusion, at least 75% improvement, We again reviewed MRI report, right occipital stroke, moderate supratentorium small vessel disease EEG October 2021 showed no significant abnormality   REVIEW OF SYSTEMS: Out of a complete 14 system review of symptoms, the patient complains only of the following symptoms, and all other reviewed systems are negative.  Blurry vision  ALLERGIES: Allergies  Allergen Reactions  . Cefoxitin   . Ceftin [Cefuroxime Axetil] Other (See Comments)    Seeing lights  . Codeine Other (See Comments)    Unknown    HOME MEDICATIONS: Outpatient Medications Prior to Visit  Medication Sig Dispense Refill  . amLODipine (NORVASC) 5 MG tablet Take 5 mg by  mouth at bedtime.     [name] aspirin EC 81 MG tablet Take 81 mg by mouth daily. Swallow whole.    [name] atorvastatin (LIPITOR) 10 MG tablet Take 10 mg  by mouth daily.    . benazepril (LOTENSIN) 40 MG tablet Take 40 mg by mouth at bedtime.     . levETIRAcetam (KEPPRA XR) 500 MG 24 hr tablet Take 1 tablet (500 mg total) by mouth at bedtime. 30 tablet 5  . metoprolol tartrate (LOPRESSOR) 50 MG tablet Take 50 mg by mouth 2 (two) times daily. 100mg  1x a day     No facility-administered medications prior to visit.    PAST MEDICAL HISTORY: Past Medical History:  Diagnosis Date  . Hypertension   . Stroke [facility])     PAST SURGICAL HISTORY: Past Surgical History:  Procedure Laterality Date  . PARTIAL HYSTERECTOMY      FAMILY HISTORY: Family History  Adopted: Yes  Problem Relation Age of Onset  . Other Mother        unsure of history  . Other Father        unsure of history    SOCIAL HISTORY: Social History   Socioeconomic History  . Marital status: Widowed    Spouse name: Not on file  . Number of children: 6  . Years of education: 10th grade  . Highest education level: Not on file  Occupational History  . Occupation: Retired  Tobacco Use  . Smoking status: Never Smoker  . Smokeless tobacco: Never Used  Substance and Sexual Activity  . Alcohol use: No  . Drug use: Never  . Sexual activity: Never  Other Topics Concern  . Not on file  Social History Narrative   Right-handed.   No daily use of caffeine.   She lives at home with three of her children (she has four living).   Social Determinants of Health   Financial Resource Strain: Not on file  Food Insecurity: Not on file  Transportation Needs: Not on file  Physical Activity: Not on file  Stress: Not on file  Social Connections: Not on file  Intimate Partner Violence: Not on file    PHYSICAL EXAM  Vitals:   10/30/20 1523  BP: 131/77  Pulse: 73  Weight: 132 lb (59.9 kg)  Height: 5' (1.524 m)   Body mass index is 25.78 kg/m.  Generalized: Well developed, in no acute distress  MMSE - Mini Mental State Exam 07/17/2020  Orientation to time 4   Orientation to Place 5  Registration 3  Attention/ Calculation 3  Recall 3  Language- name 2 objects 2  Language- repeat 1  Language- follow 3 step command 3  Language- read & follow direction 1  Write a sentence 1  Copy design 0  Total score 26     PHYSICAL EXAMNIATION:  Gen: NAD, conversant, well nourised, well groomed                     Cardiovascular: Regular rate rhythm, no peripheral edema, warm, nontender. Eyes: Conjunctivae clear without exudates or hemorrhage Neck: Supple, no carotid bruits. Pulmonary: Clear to auscultation bilaterally   NEUROLOGICAL EXAM:  MENTAL STATUS: Speech/Cognition: Awake, alert, normal speech, oriented to history taking and casual conversation.  CRANIAL NERVES: CN II: Visual fields are full to confrontation.  Pupils are round equal and briskly reactive to light. CN III, IV, VI: extraocular movement are normal. No ptosis. CN V: Facial sensation is intact to light touch. CN VII:  Face is symmetric with normal eye closure and smile. CN VIII: Hearing is normal to casual conversation CN IX, X: Palate elevates symmetrically. Phonation is normal. CN XI: Head turning and shoulder shrug are intact CN XII: Tongue is midline with normal movements and no atrophy.  MOTOR: Muscle bulk and tone are normal. Muscle strength is normal.  REFLEXES: Reflexes are 2  and symmetric at the biceps, triceps, knees and ankles. Plantar responses are flexor.  SENSORY: Intact to light touch, pinprick, positional and vibratory sensation at fingers and toes.  COORDINATION: There is no trunk or limb ataxia.    GAIT/STANCE: She needs push-up to get up from seated position, unsteady, dragging right leg  DIAGNOSTIC DATA (LABS, IMAGING, TESTING) - I reviewed patient records, labs, notes, testing and imaging myself where available.  Lab Results  Component Value Date   WBC 7.0 06/07/2017   HGB 12.1 06/07/2017   HCT 35.5 (L) 06/07/2017   MCV 90.6 06/07/2017    PLT 373 06/07/2017      Component Value Date/Time   NA 140 06/07/2017 1422   K 3.4 (L) 06/07/2017 1422   CL 107 06/07/2017 1422   CO2 21 (L) 06/07/2017 1422   GLUCOSE 97 06/07/2017 1422   BUN 18 06/07/2017 1422   CREATININE 0.94 06/07/2017 1422   CALCIUM 8.9 06/07/2017 1422   GFRNONAA 55 (L) 06/07/2017 1422   GFRAA >60 06/07/2017 1422   No results found for: CHOL, HDL, LDLCALC, LDLDIRECT, TRIG, CHOLHDL No results found for: HGBA1C No results found for: VITAMINB12 No results found for: TSH  ASSESSMENT AND PLAN 87 y.o. year old female    1.  Right occipital stroke -US carotid arteries showed less than 39% stenosis bilaterally -echocardiogram showed normal left ventricle function, moderate asymmetric left ventricular hypertrophy of the basal septal segment, right ventricle systolic click functions normal, left atrial size was severely dilated (seeing cardiology) -Continue aspirin 81 mg daily  2.  Cardiac arrhythmia -Per cards note nonsustained VTAC, paroxysmal SVT, frequent PVC -On metoprolol, amlodipine, Benzapril, atorvastatin  3.  Recurrent spells of blurry vision, right hand weakness Probable partial seizure, Significant improvement if she is compliant with the medications We will check Keppra level today, filled prescription  Marcial Pacas, M.D. Ph.D.  [facility] [place], [place] [address] Phone: [phone] Fax:      [phone]

## 2020-11-02 LAB — LEVETIRACETAM LEVEL: Levetiracetam Lvl: <1 ug/mL — ABNORMAL LOW (ref 10.0–40.0)

## 2020-11-03 ENCOUNTER — Telehealth: Payer: Self-pay | Admitting: Neurology

## 2020-11-03 NOTE — Telephone Encounter (Signed)
Please call patient, Keppra level was less than 1, indicating she is not compliant with her medications, which likely explain her recurrent spells,  Please emphasized the importance of taking medicine regularly

## 2020-11-03 NOTE — Progress Notes (Signed)
I have reviewed and agreed above plan. 

## 2020-11-03 NOTE — Telephone Encounter (Signed)
I spoke to her daughter, [name] (on [place]). She verbalized understanding of the information below. States her mother had some mild dizziness after the first dose of the medication and was resistant to continue it. I explained that this is likely just an initial side effect and will self resolve after being on it for a few weeks. It is more important to manage her spells. She will talk to her mother and make sure she takes the Keppra XR consistently. She will call us back with any further concerns.

## 2021-02-09 DIAGNOSIS — H35013 Changes in retinal vascular appearance, bilateral: Secondary | ICD-10-CM | POA: Diagnosis not present

## 2021-02-09 DIAGNOSIS — H35033 Hypertensive retinopathy, bilateral: Secondary | ICD-10-CM | POA: Diagnosis not present

## 2021-02-09 DIAGNOSIS — H35363 Drusen (degenerative) of macula, bilateral: Secondary | ICD-10-CM | POA: Diagnosis not present

## 2021-02-09 DIAGNOSIS — H353132 Nonexudative age-related macular degeneration, bilateral, intermediate dry stage: Secondary | ICD-10-CM | POA: Diagnosis not present

## 2021-02-14 DIAGNOSIS — R5383 Other fatigue: Secondary | ICD-10-CM | POA: Diagnosis not present

## 2021-02-14 DIAGNOSIS — R7303 Prediabetes: Secondary | ICD-10-CM | POA: Diagnosis not present

## 2021-02-14 DIAGNOSIS — I10 Essential (primary) hypertension: Secondary | ICD-10-CM | POA: Diagnosis not present

## 2021-02-14 DIAGNOSIS — E782 Mixed hyperlipidemia: Secondary | ICD-10-CM | POA: Diagnosis not present

## 2021-02-21 DIAGNOSIS — R7303 Prediabetes: Secondary | ICD-10-CM | POA: Diagnosis not present

## 2021-02-21 DIAGNOSIS — N182 Chronic kidney disease, stage 2 (mild): Secondary | ICD-10-CM | POA: Diagnosis not present

## 2021-02-21 DIAGNOSIS — Z0000 Encounter for general adult medical examination without abnormal findings: Secondary | ICD-10-CM | POA: Diagnosis not present

## 2021-02-21 DIAGNOSIS — I10 Essential (primary) hypertension: Secondary | ICD-10-CM | POA: Diagnosis not present

## 2021-02-21 DIAGNOSIS — R569 Unspecified convulsions: Secondary | ICD-10-CM | POA: Diagnosis not present

## 2021-02-21 DIAGNOSIS — E782 Mixed hyperlipidemia: Secondary | ICD-10-CM | POA: Diagnosis not present

## 2021-04-13 DIAGNOSIS — M1711 Unilateral primary osteoarthritis, right knee: Secondary | ICD-10-CM | POA: Diagnosis not present

## 2021-04-13 DIAGNOSIS — M170 Bilateral primary osteoarthritis of knee: Secondary | ICD-10-CM | POA: Diagnosis not present

## 2021-04-13 DIAGNOSIS — M25561 Pain in right knee: Secondary | ICD-10-CM | POA: Diagnosis not present

## 2021-05-01 ENCOUNTER — Ambulatory Visit: Payer: Medicare Other | Admitting: Neurology

## 2021-05-23 ENCOUNTER — Ambulatory Visit (INDEPENDENT_AMBULATORY_CARE_PROVIDER_SITE_OTHER): Payer: Medicare Other | Admitting: Adult Health

## 2021-05-23 ENCOUNTER — Other Ambulatory Visit: Payer: Self-pay

## 2021-05-23 ENCOUNTER — Encounter: Payer: Self-pay | Admitting: Adult Health

## 2021-05-23 VITALS — BP 125/76 | HR 72 | Ht 61.0 in | Wt 128.8 lb

## 2021-05-23 DIAGNOSIS — R569 Unspecified convulsions: Secondary | ICD-10-CM

## 2021-05-23 DIAGNOSIS — I639 Cerebral infarction, unspecified: Secondary | ICD-10-CM | POA: Diagnosis not present

## 2021-05-23 NOTE — Patient Instructions (Addendum)
Your Plan:  Restart Keppra XR '500mg'$  nightly to help prevent your seizures  Recommend tracking when your seizures occur to see if we can identify a certain trigger  Would recommend restarting atorvastatin and aspirin 81 mg daily for secondary stroke prevention     Follow up in 6 months with Dr. [name] or call earlier if needed     Thank you for coming to see [place] at [facility]. I hope we have been able to provide you high quality care today.  You may receive a patient satisfaction survey over the next few weeks. We would appreciate your feedback and comments so that we may continue to improve ourselves and the health of our patients.

## 2021-05-23 NOTE — Progress Notes (Signed)
Primary neurologist: Dr. [name]  Reason for visit:  Transient neurological symptoms Hx of occipital stroke   Chief Complaint  Patient presents with   Occipital stroke    Rm 2,  6 month FU  dgtr- [name] "not taking lipitor, my cholesterol has always been good; when I took levetiracetam I don't feel good in my head the next day- haven't taken it in a while, still having little seizures per dgtr"      HISTORY OF PRESENT ILLNESS: Drothy D Vaughan is a 87 year old female, seen in request by her primary care physician [name], [name] for evaluation of abnormal CT scan, she is accompanied by her daughter [name] at today's visit on November 15, 2019,   I have reviewed and summarized the referring note from the referring physician.  She had past medical history of hypertension, hyperlipidemia,   Since September 2020, she reported recurrent similar episode of blurry vision, right hand weakness following her evening meal on Sunday.  It happens intermittently,   She had a gradual decreased appetite over the years, but still active, able to have help with household chores, she tends to have smaller meal on Sunday, by evening time, when she finished her evening meal, she will often get blurry vision, right hand weakness, to the point of difficulty picking up her fork, symptoms last about 15 minutes, she denied loss of consciousness, denied increased gait abnormality, she has significant right knee pain is a candidate for right knee replacement, ambulate with a cane   For that reason, she was referred for CAT scan, I have personally reviewed CT head without contrast on November 11, 2019, generalized atrophy, supratentorium small vessel disease, subacute right parieto-occipital PCAs infarction   She was started on aspirin 81 mg daily, and Lipitor 10 mg every night since February 11   Update December 08, 2019: At previous visit, she complains of spells only at Sunday, but on further questioning,  she reported intermittent spells couple times each week, sudden onset mild confusion, slow reaction, funny feelings, with some visual distortion, sometimes right arm numbness, heaviness.  She describes 1 episode on November 23, 2019, after visiting her son at nursing home, she was sitting in the car with the bright sunshine, she suddenly felt funny, mild visual distortion, right arm heaviness, lasting 10 to 15 minutes, she denied loss of consciousness.  Her daughter also described, when she was having a spells, she often was noticed by days into space, but when she interact with patient, she was a bit slow to react, but able to answer questions   She was seen by cardiologist, had 2 weeks cardiac monitoring, visit and reports are pending   I personally reviewed MRI of the brain without contrast on November 19, 2019 from [facility], subacute infarction at the right occipital lobe, moderate supratentorium small vessel disease   US carotid artery showed less than 39% stenosis bilaterally Echocardiogram showed normal left ventricular function, moderate asymmetric left ventricular hypertrophy of the basal septal segment, right ventricle systolic click functions normal, left atrial size with severely dilated  UPDATE Oct 30 2020 Dr. [name]: She is accompanied by her daughter going to today's clinical visit, she has not been compliant with her Keppra xr 500 mg every night, daughter reported that with Keppra, she is doing better, had much less spells of sudden onset blurry vision, right hand weakness, confusion, at least 75% improvement, We again reviewed MRI report, right occipital stroke, moderate supratentorium small vessel disease EEG October  2021 showed no significant abnormality  UPDATE 05/23/2021 JM:  Returns for 6 month follow-up accompanied by her daughter. She continues to be noncompliant with Keppra XR as she did not feel well the next morning after taking. Difficult to describe symptoms but reports  "almost like a lightheadedness".  She continues to have spells occasionally after eating which can last 5-10 minutes.  Previously checked glucose levels and BG during these times which were satisfactory.  She has also stopped taking aspirin and atorvastatin -denies any side effects.        REVIEW OF SYSTEMS: Out of a complete 14 system review of symptoms, the patient complains only of the following symptoms listed in HPI, and all other reviewed systems are negative.    ALLERGIES: Allergies  Allergen Reactions   Cefoxitin    Ceftin [Cefuroxime Axetil] Other (See Comments)    Seeing lights   Codeine Other (See Comments)    Unknown    HOME MEDICATIONS: Outpatient Medications Prior to Visit  Medication Sig Dispense Refill   amLODipine (NORVASC) 5 MG tablet Take 5 mg by mouth at bedtime.      aspirin EC 81 MG tablet Take 81 mg by mouth daily. Swallow whole.     benazepril (LOTENSIN) 40 MG tablet Take 40 mg by mouth at bedtime.      levETIRAcetam (KEPPRA XR) 500 MG 24 hr tablet Take 1 tablet (500 mg total) by mouth at bedtime. 90 tablet 4   metoprolol tartrate (LOPRESSOR) 50 MG tablet Take 50 mg by mouth 2 (two) times daily. '100mg'$  1x a day     atorvastatin (LIPITOR) 10 MG tablet Take 10 mg by mouth daily. (Patient not taking: Reported on 05/23/2021)     No facility-administered medications prior to visit.    PAST MEDICAL HISTORY: Past Medical History:  Diagnosis Date   Hypertension    Stroke [facility])     PAST SURGICAL HISTORY: Past Surgical History:  Procedure Laterality Date   PARTIAL HYSTERECTOMY      FAMILY HISTORY: Family History  Adopted: Yes  Problem Relation Age of Onset   Other Mother        unsure of history   Other Father        unsure of history    SOCIAL HISTORY: Social History   Socioeconomic History   Marital status: Widowed    Spouse name: Not on file   Number of children: 6   Years of education: 10th grade   Highest education level: Not on file   Occupational History   Occupation: Retired  Tobacco Use   Smoking status: Never   Smokeless tobacco: Never  Substance and Sexual Activity   Alcohol use: No   Drug use: Never   Sexual activity: Never  Other Topics Concern   Not on file  Social History Narrative   Right-handed.   No daily use of caffeine.   She lives at home with three of her children (she has four living).   Social Determinants of Health   Financial Resource Strain: Not on file  Food Insecurity: Not on file  Transportation Needs: Not on file  Physical Activity: Not on file  Stress: Not on file  Social Connections: Not on file  Intimate Partner Violence: Not on file    PHYSICAL EXAM  Vitals:   05/23/21 1439  BP: 125/76  Pulse: 72  Weight: 128 lb 12.8 oz (58.4 kg)  Height: '5\' 1"'$  (1.549 m)    Body mass index is 24.34 kg/m.  Generalized: Well developed, in no acute distress    PHYSICAL EXAMNIATION:  Gen: NAD, conversant, very pleasant elderly Caucasian female, well nourised, well groomed                     Cardiovascular: Regular rate rhythm, no peripheral edema, warm, nontender. Eyes: Conjunctivae clear without exudates or hemorrhage Neck: Supple, no carotid bruits. Pulmonary: Clear to auscultation bilaterally   NEUROLOGICAL EXAM:  MENTAL STATUS: Speech/Cognition: Awake, alert, normal speech, oriented to history taking and casual conversation.  CRANIAL NERVES: CN II: Visual fields are full to confrontation.  Pupils are round equal and briskly reactive to light. CN III, IV, VI: extraocular movement are normal. No ptosis. CN V: Facial sensation is intact to light touch. CN VII: Face is symmetric with normal eye closure and smile. CN VIII: Hearing is normal to casual conversation CN IX, X: Palate elevates symmetrically. Phonation is normal. CN XI: Head turning and shoulder shrug are intact CN XII: Tongue is midline with normal movements and no atrophy.  MOTOR: Muscle bulk and tone are  normal. Muscle strength is normal.  REFLEXES: Reflexes are 2  and symmetric at the biceps, triceps, knees and ankles. Plantar responses are flexor.  SENSORY: Intact to light touch, pinprick, positional and vibratory sensation at fingers and toes.  COORDINATION: There is no trunk or limb ataxia.    GAIT/STANCE: She needs push-up to get up from seated position, unsteady with use of cane    DIAGNOSTIC DATA (LABS, IMAGING, TESTING) - I reviewed patient records, labs, notes, testing and imaging myself where available.  Labs routinely monitored by PCP which is unavailable via epic     ASSESSMENT AND PLAN 87 y.o. year old female     1.  Right occipital stroke -US carotid arteries showed less than 39% stenosis bilaterally -echocardiogram showed normal left ventricle function, moderate asymmetric left ventricular hypertrophy of the basal septal segment, right ventricle systolic click functions normal, left atrial size was severely dilated (seeing cardiology) -Advised to restart aspirin 81 mg daily and atorvastatin 10 mg daily for secondary stroke prevention  2.  Cardiac arrhythmia -Per cards note nonsustained VTAC, paroxysmal SVT, frequent PVC -On metoprolol, amlodipine, Benzapril, atorvastatin per cardiology  3.  Recurrent spells of blurry vision, right hand weakness Probable partial seizure Continue noncompliance due to side effect after taking only 1 dose of Keppra -encouraged her to take for at least 2 weeks as side effects may improve after taking for a longer duration especially as she experiences significant improvement when compliant She agrees to restart -she was advised to call office if she continues to experience difficulty tolerating As symptoms occur after meals, advised to record when these occur and what she ate/drank to see if we can identify any specific triggers   Follow-up in 6 months or call earlier if needed     CC:  [facility] provider: Dr. Kandis Ban, MD   I spent 34 minutes of face-to-face and non-face-to-face time with patient and daughter.  This included previsit chart review, lab review, study review, electronic health record documentation, patient and daughter education and discussion regarding recurrent spells and importance of Keppra compliance, prior stroke with secondary stroke prevention measures and importance of aggressive stroke risk factor management and answered all other questions to patient and daughter satisfaction  Frann Rider, [facility]  [facility] [address] [place], [place] [address]  Phone [phone] Fax [phone] Note: This document was prepared with digital dictation and  possible smart Company secretary. Any transcriptional errors that result from this process are unintentional.

## 2021-05-29 ENCOUNTER — Ambulatory Visit (HOSPITAL_BASED_OUTPATIENT_CLINIC_OR_DEPARTMENT_OTHER): Payer: Medicare Other | Admitting: Cardiology

## 2021-06-06 ENCOUNTER — Ambulatory Visit (INDEPENDENT_AMBULATORY_CARE_PROVIDER_SITE_OTHER): Payer: Medicare Other | Admitting: Cardiology

## 2021-06-06 ENCOUNTER — Encounter (HOSPITAL_BASED_OUTPATIENT_CLINIC_OR_DEPARTMENT_OTHER): Payer: Self-pay | Admitting: Cardiology

## 2021-06-06 ENCOUNTER — Other Ambulatory Visit: Payer: Self-pay

## 2021-06-06 VITALS — BP 118/76 | HR 68 | Ht 61.0 in | Wt 127.0 lb

## 2021-06-06 DIAGNOSIS — I639 Cerebral infarction, unspecified: Secondary | ICD-10-CM | POA: Diagnosis not present

## 2021-06-06 DIAGNOSIS — Z8673 Personal history of transient ischemic attack (TIA), and cerebral infarction without residual deficits: Secondary | ICD-10-CM | POA: Diagnosis not present

## 2021-06-06 DIAGNOSIS — I472 Ventricular tachycardia: Secondary | ICD-10-CM

## 2021-06-06 DIAGNOSIS — E785 Hyperlipidemia, unspecified: Secondary | ICD-10-CM | POA: Diagnosis not present

## 2021-06-06 DIAGNOSIS — I493 Ventricular premature depolarization: Secondary | ICD-10-CM | POA: Diagnosis not present

## 2021-06-06 DIAGNOSIS — I10 Essential (primary) hypertension: Secondary | ICD-10-CM

## 2021-06-06 DIAGNOSIS — I4729 Other ventricular tachycardia: Secondary | ICD-10-CM

## 2021-06-06 NOTE — Patient Instructions (Signed)

## 2021-06-06 NOTE — Progress Notes (Signed)
Cardiology Office Note:    Date:  06/06/2021   ID:  KAYLEE TRIVETT, DOB 03/23/1933, MRN [id number]  PCP:  [name], MD  Cardiologist:  Buford Dresser, MD  Referring MD: [name], MD   CC: followup  History of Present Illness:    Danielle Vaughan is a 87 y.o. female with a hx of hypertension, hyperlipidemia who is seen for follow up today. I initially met her 12/09/19 as a new consult at the request of [name], MD for the evaluation and management of stroke.  Neuro history: History of intermittent blurry vision, right hand weakness since 06/2019. She had CT head for these symptoms, which noted generalized atrophy, supratentorial small vessel disease, and subacute right parieto-occipital PCA infarct.   Zio monitor 12/06/19: 14 days. Min HR 42 bpm, max HR 138 bpm, avg 62 bpm. 1 run of NSVT of 4 beats, avg rate 115 bpm. 41 PSVT events, fastest was 8 beats at 138 bpm, longest was 13 beats at 106 bpm. PVC burden 15.8%. Rare atrial ectopy  Today: No new concerns. Has been on and off aspirin, not sure if she should be taking. We discussed recommendations. She has not had any bleeding issues or easy bruising. She is amenable to restarting. Has concerns about Keppra, has not been taking. Deferred this to [name].  Tolerating medications for BP. Checks occasionally at home, no extreme high/low numbers.   Denies chest pain, shortness of breath at rest or with normal exertion. No PND, orthopnea, LE edema or unexpected weight gain. No syncope or palpitations.   Activity limited by her knee pain, but otherwise stays active without issue.   Past Medical History:  Diagnosis Date   Hypertension    Stroke [facility])     Past Surgical History:  Procedure Laterality Date   PARTIAL HYSTERECTOMY      Current Medications: Current Outpatient Medications on File Prior to Visit  Medication Sig   amLODipine (NORVASC) 5 MG tablet Take 5 mg by mouth at bedtime.     aspirin EC 81 MG tablet Take 81 mg by mouth daily. Swallow whole.   atorvastatin (LIPITOR) 10 MG tablet Take 10 mg by mouth daily.   benazepril (LOTENSIN) 40 MG tablet Take 40 mg by mouth at bedtime.    levETIRAcetam (KEPPRA XR) 500 MG 24 hr tablet Take 1 tablet (500 mg total) by mouth at bedtime.   metoprolol tartrate (LOPRESSOR) 50 MG tablet Take 50 mg by mouth 2 (two) times daily. $RemoveBefo'100mg'[id number]$  1x a day   No current facility-administered medications on file prior to visit.     Allergies:   Cefoxitin, Ceftin [cefuroxime axetil], and Codeine   Social History   Tobacco Use   Smoking status: Never   Smokeless tobacco: Never  Substance Use Topics   Alcohol use: No   Drug use: Never    Family History: family history includes Other in her father and mother. She was adopted.  ROS:   Please see the history of present illness.  Additional pertinent ROS otherwise unremarkable.  EKGs/Labs/Other Studies Reviewed:    The following studies were reviewed today: Echo 12/28/19   1. Left ventricular ejection fraction, by estimation, is 55 to 60%. Left  ventricular ejection fraction by 3D volume is 55 %. The left ventricle has  normal function. The left ventricle has no regional wall motion  abnormalities. There is moderate  asymmetric left ventricular hypertrophy of the basal-septal segment. Left  ventricular diastolic parameters are consistent with Grade II  diastolic  dysfunction (pseudonormalization). Elevated left atrial pressure. The  average left ventricular global  longitudinal strain is -18.0 %.   2. Right ventricular systolic function is normal. The right ventricular  size is normal. There is moderately elevated pulmonary artery systolic  pressure. The estimated right ventricular systolic pressure is 18.8 mmHg.   3. Left atrial size was severely dilated.   4. Right atrial size was mildly dilated.   5. The mitral valve is normal in structure. Mild mitral valve  regurgitation.   6.  The aortic valve is tricuspid. Aortic valve regurgitation is not  visualized. No aortic stenosis is present.   7. The inferior vena cava is dilated in size with >50% respiratory  variability, suggesting right atrial pressure of 8 mmHg.   EKG:  EKG is personally reviewed.   06/06/21 sinus rhythm with sinus arrhythmia, with PVCs, LBBB 12/09/19 sinus rhythm with PVCs  Recent Labs: No results found for requested labs within last 8760 hours.  Recent Lipid Panel No results found for: CHOL, TRIG, HDL, CHOLHDL, VLDL, LDLCALC, LDLDIRECT  Physical Exam:    VS:  BP 118/76   Pulse 68   Ht $R'5\' 1"'SG$  (1.549 m)   Wt 127 lb (57.6 kg)   BMI 24.00 kg/m     Wt Readings from Last 3 Encounters:  06/06/21 127 lb (57.6 kg)  05/23/21 128 lb 12.8 oz (58.4 kg)  10/30/20 132 lb (59.9 kg)    GEN: Well nourished, well developed in no acute distress HEENT: Normal, moist mucous membranes NECK: No JVD CARDIAC: regular rhythm, normal S1 and S2, no rubs or gallops. No murmur. VASCULAR: Radial and DP pulses 2+ bilaterally. No carotid bruits RESPIRATORY:  Clear to auscultation without rales, wheezing or rhonchi  ABDOMEN: Soft, non-tender, non-distended MUSCULOSKELETAL:  Ambulates independently SKIN: Warm and dry, no edema NEUROLOGIC:  Alert and oriented x 3. No focal neuro deficits noted. PSYCHIATRIC:  Normal affect    ASSESSMENT:    1. Frequent PVCs   2. History of CVA (cerebrovascular accident)   3. NSVT (nonsustained ventricular tachycardia) ([place])   4. Essential hypertension   5. Hyperlipidemia, unspecified hyperlipidemia type     PLAN:    History of stroke: -CT with subacute right parieto-occipital PCA infarct, with generalized atrophy and small vessel changes -she has not been on aspirin, but denies issues with it. From my perspective, with history of CVA, would continue 81 mg daily -continue atorvastatin 10 mg -echo, monitor as above -CV risk factor modification, as below.  Nonsustained  ventricular tachycardia, paroxysmal SVT, frequent PVCs per monitor: -she is asymptomatic -echo as above -already on metoprolol, continue  Hypertension: at goal today -continue amlodipine 5 mg daily -continue benazepril 40 mg daily -continue metoprolol tartrate 50 mg BID  Hyperlipidemia: -continue atorvastatin 10 mg daily  Cardiac risk counseling and prevention recommendations: -recommend heart healthy/Mediterranean diet, with whole grains, fruits, vegetable, fish, lean meats, nuts, and olive oil. Limit salt. -recommend moderate walking, 3-5 times/week for 30-50 minutes each session. Aim for at least 150 minutes.week. Goal should be pace of 3 miles/hours, or walking 1.5 miles in 30 minutes -recommend avoidance of tobacco products. Avoid excess alcohol.   Plan for follow up: 1 year or sooner as needed  Buford Dresser, MD, PhD [place]  [facility]    Medication Adjustments/Labs and Tests Ordered: Current medicines are reviewed at length with the patient today.  Concerns regarding medicines are outlined above.  Orders Placed This Encounter  Procedures   EKG 12-Lead  No orders of the defined types were placed in this encounter.   Patient Instructions  Medication Instructions:  Your Physician recommend you continue on your current medication as directed.    *If you need a refill on your cardiac medications before your next appointment, please call your pharmacy*   Lab Work: None ordered today   Testing/Procedures: None ordered today   Follow-Up: At [facility], you and your health needs are our priority.  As part of our continuing mission to provide you with exceptional heart care, we have created designated Provider Care Teams.  These Care Teams include your primary Cardiologist (physician) and Advanced Practice Providers (APPs -  Physician Assistants and Nurse Practitioners) who all work together to provide you with the care you need, when you need  it.  We recommend signing up for the patient portal called "MyChart".  Sign up information is provided on this After Visit Summary.  MyChart is used to connect with patients for Virtual Visits (Telemedicine).  Patients are able to view lab/test results, encounter notes, upcoming appointments, etc.  Non-urgent messages can be sent to your provider as well.   To learn more about what you can do with MyChart, go to [email or web address].    Your next appointment:   1 year(s)  The format for your next appointment:   In Person  Provider:   Buford Dresser, MD    Signed, Buford Dresser, MD PhD 06/06/2021  [place]

## 2021-06-07 ENCOUNTER — Ambulatory Visit (HOSPITAL_BASED_OUTPATIENT_CLINIC_OR_DEPARTMENT_OTHER): Payer: Medicare Other | Admitting: Cardiology

## 2021-07-09 ENCOUNTER — Ambulatory Visit: Payer: Medicare Other | Admitting: Neurology

## 2021-08-18 IMAGING — CT CT HEAD W/O CM
3 of 4 series · 15 of 47 positions shown, 18 images · non-contrast
Comparison: No pertinent prior studies available for comparison.

CLINICAL DATA: Right hand weakness. Additional history provided:
Right hand weakness and blurred vision after eating, 1 month
duration.

EXAM:
CT HEAD WITHOUT CONTRAST
TECHNIQUE: Contiguous axial images were obtained from the base of the skull
through the vertex without intravenous contrast.

[Series 2: head 5.00 hr40 s3 axial ibhc · axial · 0.42mm/px · z∈[-622,-477]mm · 9 of 35 slices shown, 12 images]
[im 3/35  brain]
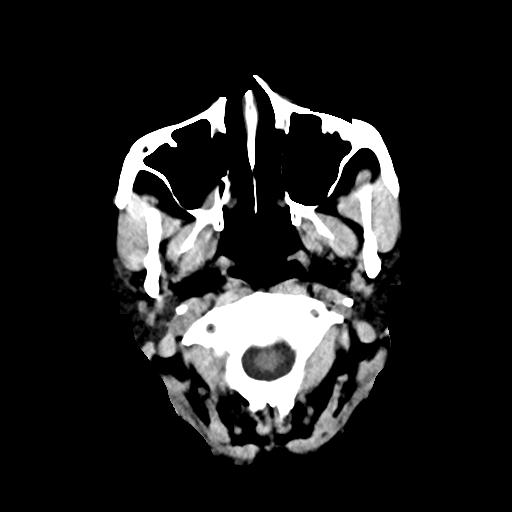
[im 3/35  bone]
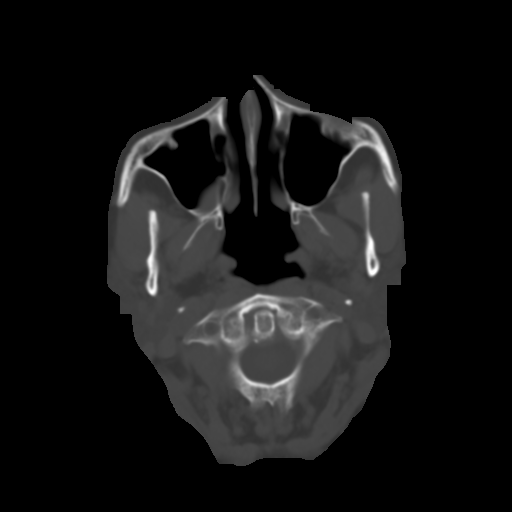
[im 8/35  brain]
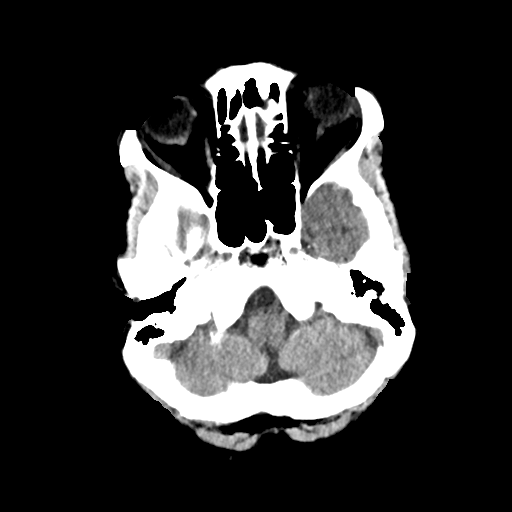
[im 10/35  brain]
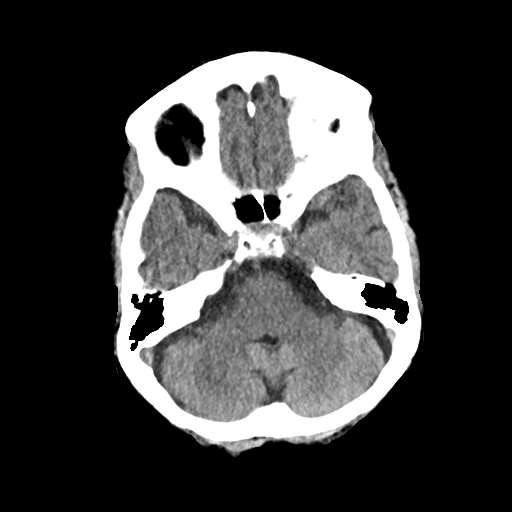
[im 15/35  brain]
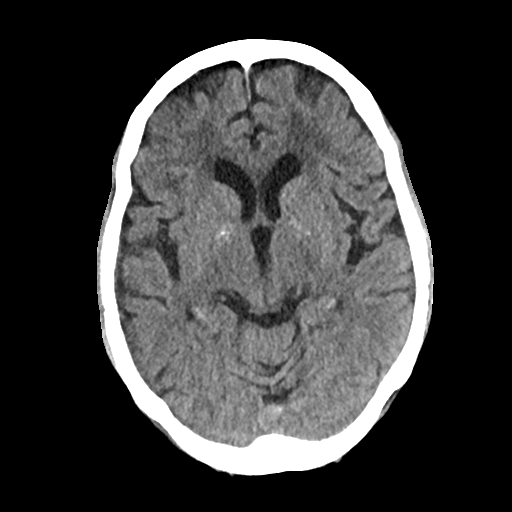
[im 18/35  brain]
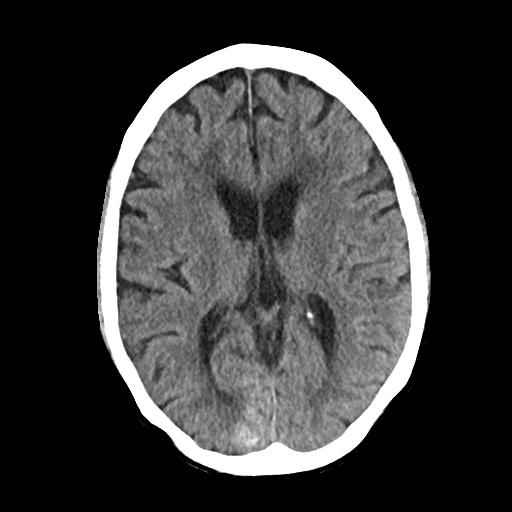
[im 18/35  bone]
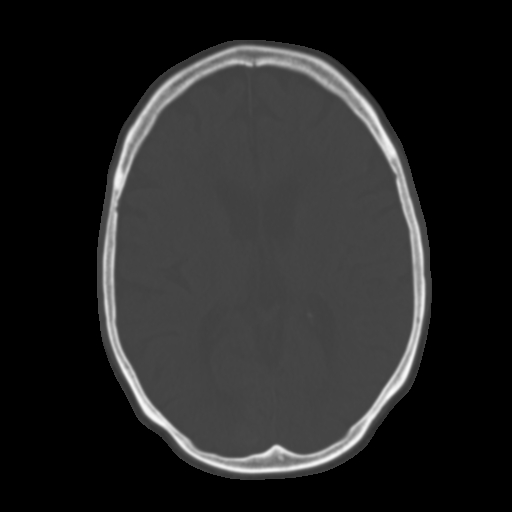
[im 20/35  brain]
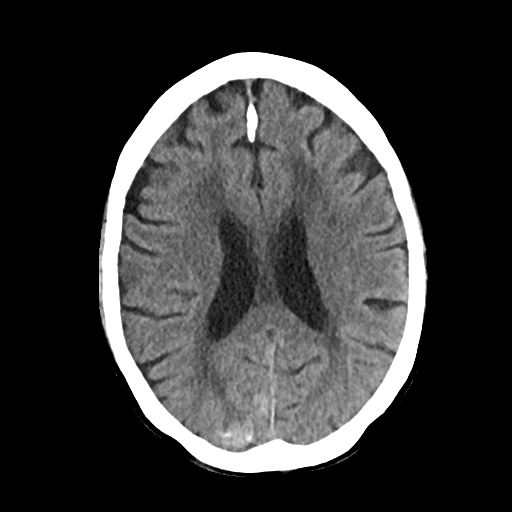
[im 25/35  brain]
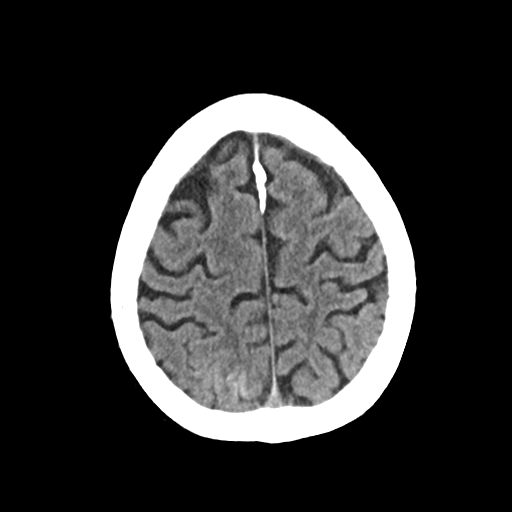
[im 27/35  brain]
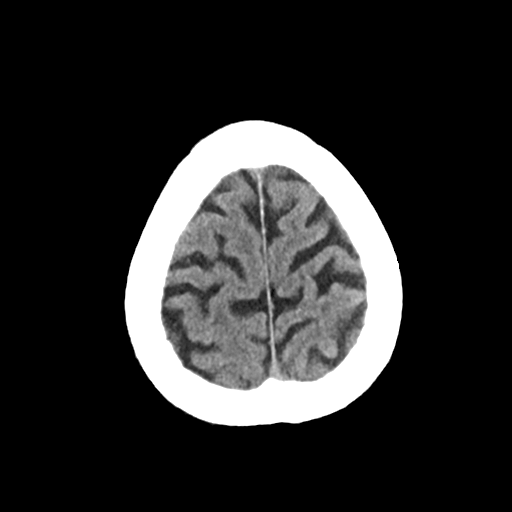
[im 32/35  brain]
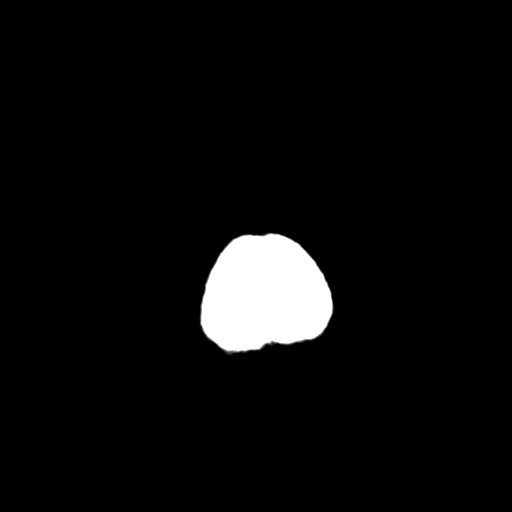
[im 32/35  bone]
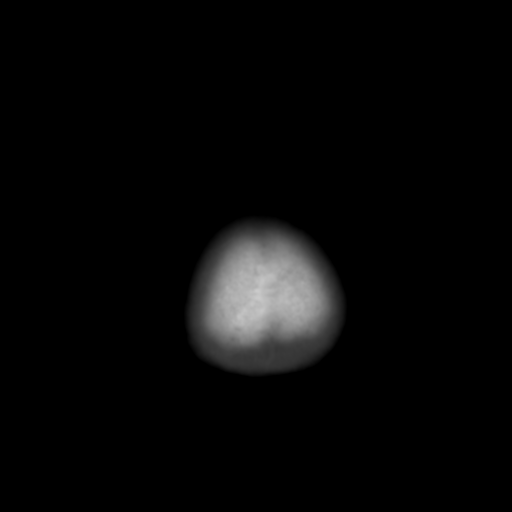

[Series 4: head 3.00 hr40 s3 sag · sagittal · 0.34mm/px · 3 of 61 slices shown]
[im 21/61  brain]
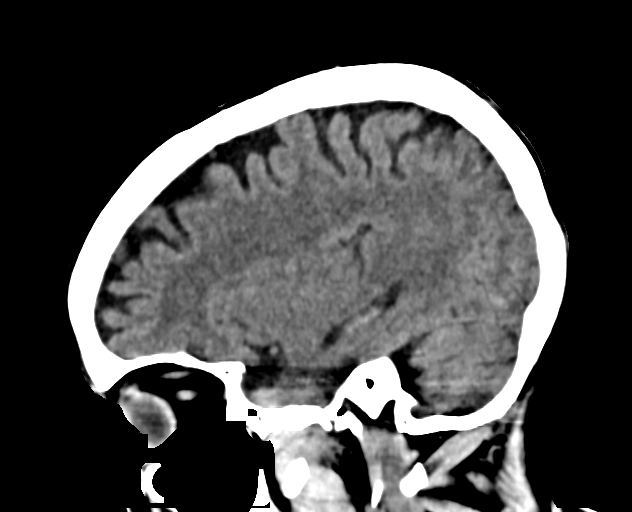
[im 31/61  brain]
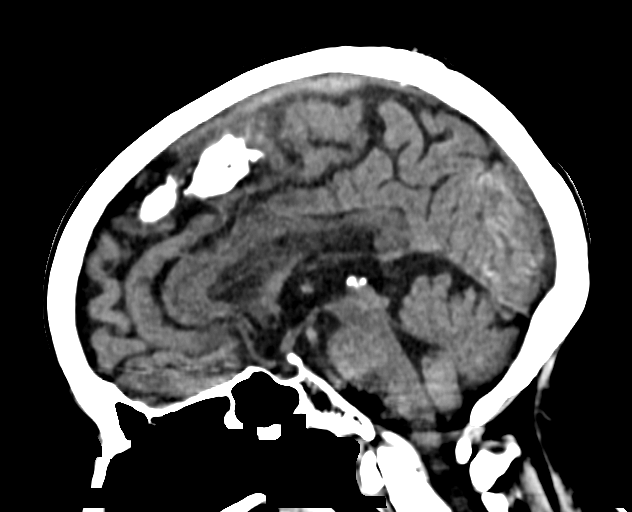
[im 41/61  brain]
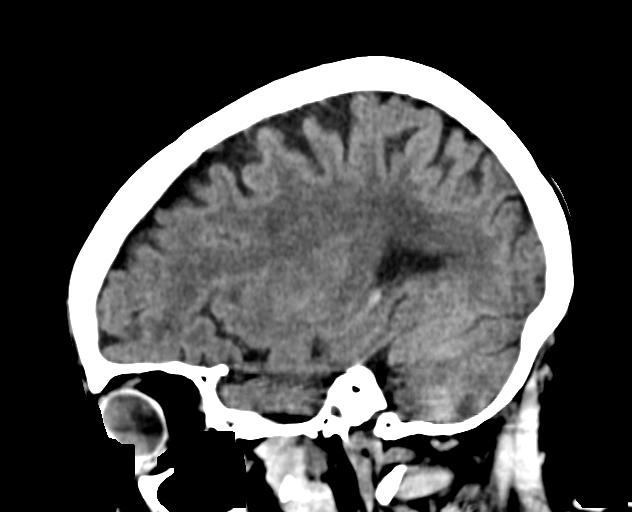

[Series 6: head 3.00 hr40 s3 cor · coronal · 0.34mm/px · 3 of 72 slices shown]
[im 24/72  brain]
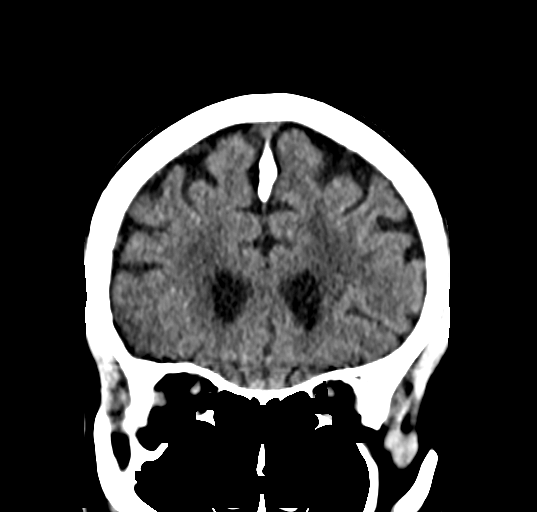
[im 32/72  brain]
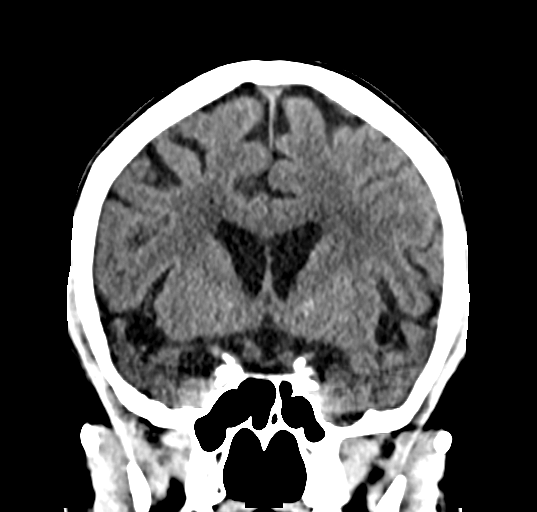
[im 40/72  brain]
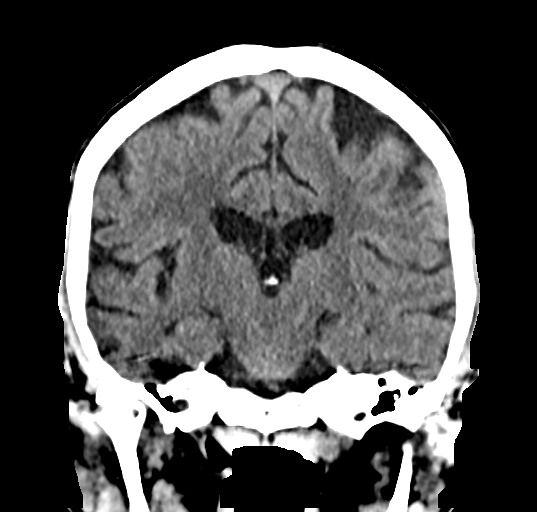

[15 of 47 positions shown; findings below may reference images not displayed]

FINDINGS: Brain:

There is a moderate-size region of gyriform hyperdensity within the
right parietooccipital lobes right PCA territory. Given provided
history, this likely reflects cortical laminar necrosis with
associated calcification at site of a late subacute infarct. There
is no convincing evidence of acute intracranial hemorrhage. No
evidence of intracranial mass. No midline shift or extra-axial fluid
collection. Mild ill-defined hypoattenuation within the cerebral
white matter is nonspecific, but consistent with chronic small
vessel ischemic disease. Mild generalized parenchymal atrophy.

Vascular: No hyperdense vessel.  Atherosclerotic calcifications.

Skull: Visualized orbits demonstrate no acute abnormality.

Sinuses/Orbits: Small right maxillary sinus mucous retention cyst.
No significant mastoid effusion.

These results were called by telephone at the time of interpretation
on 11/11/2019 at [DATE] to provider Dr. Harminder Germany, who verbally
acknowledged these results.
IMPRESSION: Findings consistent with cortical laminar necrosis and calcification
at site of a moderate-sized late subacute right parietooccipital PCA
territory infarct.

No significant mass effect or midline shift.

Mild generalized parenchymal atrophy and chronic small vessel
ischemic disease.

## 2021-08-26 DIAGNOSIS — S63501A Unspecified sprain of right wrist, initial encounter: Secondary | ICD-10-CM | POA: Diagnosis not present

## 2021-08-26 DIAGNOSIS — I10 Essential (primary) hypertension: Secondary | ICD-10-CM | POA: Diagnosis not present

## 2021-08-30 ENCOUNTER — Ambulatory Visit (INDEPENDENT_AMBULATORY_CARE_PROVIDER_SITE_OTHER): Payer: Medicare Other | Admitting: Adult Health

## 2021-08-30 ENCOUNTER — Other Ambulatory Visit: Payer: Self-pay

## 2021-08-30 ENCOUNTER — Encounter: Payer: Self-pay | Admitting: Adult Health

## 2021-08-30 VITALS — BP 146/62 | HR 62 | Ht 61.0 in | Wt 130.0 lb

## 2021-08-30 DIAGNOSIS — I639 Cerebral infarction, unspecified: Secondary | ICD-10-CM

## 2021-08-30 DIAGNOSIS — R569 Unspecified convulsions: Secondary | ICD-10-CM

## 2021-08-30 MED ORDER — ZONISAMIDE 100 MG PO CAPS
100.0000 mg | ORAL_CAPSULE | Freq: Every day | ORAL | 5 refills | Status: DC
Start: 2021-08-30 — End: 2021-09-10

## 2021-08-30 NOTE — Progress Notes (Signed)
Primary neurologist: Dr. [name]  Reason for visit:  Transient neurological symptoms Hx of occipital stroke   Chief Complaint  Patient presents with   Follow-up    Rm  2 with daughter Danielle Vaughan Pt is well, has been having some tingling in hands (mentions after she wakes up, more than likely laying on arm), and daughter mentions she gets sick like not feeling like herself and blurred vision shortly after eating.       HISTORY OF PRESENT ILLNESS: Danielle Vaughan is a 87 year old female, seen in request by her primary care physician [name], [name] for evaluation of abnormal CT scan, she is accompanied by her daughter Danielle Vaughan at today's visit on November 15, 2019,   I have reviewed and summarized the referring note from the referring physician.  She had past medical history of hypertension, hyperlipidemia,   Since September 2020, she reported recurrent similar episode of blurry vision, right hand weakness following her evening meal on Sunday.  It happens intermittently,   She had a gradual decreased appetite over the years, but still active, able to have help with household chores, she tends to have smaller meal on Sunday, by evening time, when she finished her evening meal, she will often get blurry vision, right hand weakness, to the point of difficulty picking up her fork, symptoms last about 15 minutes, she denied loss of consciousness, denied increased gait abnormality, she has significant right knee pain is a candidate for right knee replacement, ambulate with a cane   For that reason, she was referred for CAT scan, I have personally reviewed CT head without contrast on November 11, 2019, generalized atrophy, supratentorium small vessel disease, subacute right parieto-occipital PCAs infarction   She was started on aspirin 81 mg daily, and Lipitor 10 mg every night since February 11   Update December 08, 2019: At previous visit, she complains of spells only at Sunday, but on  further questioning, she reported intermittent spells couple times each week, sudden onset mild confusion, slow reaction, funny feelings, with some visual distortion, sometimes right arm numbness, heaviness.  She describes 1 episode on November 23, 2019, after visiting her son at nursing home, she was sitting in the car with the bright sunshine, she suddenly felt funny, mild visual distortion, right arm heaviness, lasting 10 to 15 minutes, she denied loss of consciousness.  Her daughter also described, when she was having a spells, she often was noticed by days into space, but when she interact with patient, she was a bit slow to react, but able to answer questions   She was seen by cardiologist, had 2 weeks cardiac monitoring, visit and reports are pending   I personally reviewed MRI of the brain without contrast on November 19, 2019 from [facility], subacute infarction at the right occipital lobe, moderate supratentorium small vessel disease   US carotid artery showed less than 39% stenosis bilaterally Echocardiogram showed normal left ventricular function, moderate asymmetric left ventricular hypertrophy of the basal septal segment, right ventricle systolic click functions normal, left atrial size with severely dilated  UPDATE Oct 30 2020 Dr. [name]: She is accompanied by her daughter going to today's clinical visit, she has not been compliant with her Keppra xr 500 mg every night, daughter reported that with Keppra, she is doing better, had much less spells of sudden onset blurry vision, right hand weakness, confusion, at least 75% improvement, We again reviewed MRI report, right occipital stroke, moderate supratentorium small vessel disease EEG October  2021 showed no significant abnormality  UPDATE 05/23/2021 JM:  Returns for 6 month follow-up accompanied by her daughter. She continues to be noncompliant with Keppra XR as she did not feel well the next morning after taking. Difficult to describe  symptoms but reports "almost like a lightheadedness".  She continues to have spells occasionally after eating which can last 5-10 minutes.  Previously checked glucose levels and BG during these times which were satisfactory.  She has also stopped taking aspirin and atorvastatin -denies any side effects.  Update 08/30/2021 JM: Returns for acute visit for same issues as prior visit. She stopped taking her Keppra due to a weird feeling in her head - only took 1-2 doses before stopping.daughter reports continued episodes of blurred vision and hand weakness which seems to have been more present this past week and usually occurs only after eating. Daughter denies any confusion associated. She is aware and remembers these events. Lasts 15-67min. No confusion.  Daughter will check glucose levels which have been stable.  Previously discussed checking blood pressure at this time but has not being doing this.  Blood pressure otherwise monitored at home which has been stable per daughter.  Patient also reports right hand numbness (pinky, ring and middle finger) upon awakening and shortly resolves.  She has remained on aspirin 81 mg daily and intermittent use of atorvastatin for secondary stroke prevention.  Routinely followed by PCP and cardiology.  No further concerns at this time.      REVIEW OF SYSTEMS: Out of a complete 14 system review of symptoms, the patient complains only of the following symptoms listed in HPI, and all other reviewed systems are negative.    ALLERGIES: Allergies  Allergen Reactions   Cefoxitin    Ceftin [Cefuroxime Axetil] Other (See Comments)    Seeing lights   Codeine Other (See Comments)    Unknown    HOME MEDICATIONS: Outpatient Medications Prior to Visit  Medication Sig Dispense Refill   amLODipine (NORVASC) 5 MG tablet Take 5 mg by mouth at bedtime.      aspirin EC 81 MG tablet Take 81 mg by mouth daily. Swallow whole.     atorvastatin (LIPITOR) 10 MG tablet Take 10 mg  by mouth daily.     benazepril (LOTENSIN) 40 MG tablet Take 40 mg by mouth at bedtime.      levETIRAcetam (KEPPRA XR) 500 MG 24 hr tablet Take 1 tablet (500 mg total) by mouth at bedtime. 90 tablet 4   metoprolol tartrate (LOPRESSOR) 50 MG tablet Take 50 mg by mouth 2 (two) times daily. 100mg  1x a day     No facility-administered medications prior to visit.    PAST MEDICAL HISTORY: Past Medical History:  Diagnosis Date   Hypertension    Stroke [facility])     PAST SURGICAL HISTORY: Past Surgical History:  Procedure Laterality Date   PARTIAL HYSTERECTOMY      FAMILY HISTORY: Family History  Adopted: Yes  Problem Relation Age of Onset   Other Mother        unsure of history   Other Father        unsure of history    SOCIAL HISTORY: Social History   Socioeconomic History   Marital status: Widowed    Spouse name: Not on file   Number of children: 6   Years of education: 10th grade   Highest education level: Not on file  Occupational History   Occupation: Retired  Tobacco Use   Smoking status:  Never   Smokeless tobacco: Never  Substance and Sexual Activity   Alcohol use: No   Drug use: Never   Sexual activity: Never  Other Topics Concern   Not on file  Social History Narrative   Right-handed.   No daily use of caffeine.   She lives at home with three of her children (she has four living).   Social Determinants of Health   Financial Resource Strain: Not on file  Food Insecurity: Not on file  Transportation Needs: Not on file  Physical Activity: Not on file  Stress: Not on file  Social Connections: Not on file  Intimate Partner Violence: Not on file    PHYSICAL EXAM  Vitals:   08/30/21 0934  BP: (!) 146/62  Pulse: 62  Weight: 130 lb (59 kg)  Height: 5\' 1"  (1.549 m)     Body mass index is 24.56 kg/m.  Generalized: Well developed, in no acute distress    PHYSICAL EXAMNIATION:  Gen: NAD, conversant, very pleasant elderly Caucasian female, well  nourised, well groomed                     Cardiovascular: Regular rate rhythm, no peripheral edema, warm, nontender. Eyes: Conjunctivae clear without exudates or hemorrhage Neck: Supple, no carotid bruits. Pulmonary: Clear to auscultation bilaterally   NEUROLOGICAL EXAM:  MENTAL STATUS: Speech/Cognition: Awake, alert, normal speech, oriented to history taking and casual conversation.  CRANIAL NERVES: CN II: Visual fields are full to confrontation.  Pupils are round equal and briskly reactive to light. CN III, IV, VI: extraocular movement are normal. No ptosis. CN V: Facial sensation is intact to light touch. CN VII: Face is symmetric with normal eye closure and smile. CN VIII: Hearing is normal to casual conversation CN IX, X: Palate elevates symmetrically. Phonation is normal. CN XI: Head turning and shoulder shrug are intact CN XII: Tongue is midline with normal movements and no atrophy.  MOTOR: Muscle bulk and tone are normal. Muscle strength is normal.  REFLEXES: Reflexes are 2  and symmetric at the biceps, triceps, knees and ankles. Plantar responses are flexor.  SENSORY: Intact to light touch, pinprick, positional and vibratory sensation at fingers and toes.  COORDINATION: There is no trunk or limb ataxia.    GAIT/STANCE: She needs push-up to get up from seated position, unsteady with use of cane         ASSESSMENT AND PLAN 87 y.o. year old female      1.  Recurrent spells of blurry vision, right hand weakness Probable partial seizure per Dr. [name] which previously greatly improved after use of Keppra Intolerant to Keppra -recommend trialing zonisamide 100 mg daily.  She was advised to contact office if difficulty tolerating or if no benefit As symptoms occur after meals, again advised to record when these occur and what she ate/drank to see if we can identify any specific triggers - unsure if possible contributing factor to postprandial hypotension? Daughter  has multiple questions regarding these events in regards to eating which were answered to the best of my ability  2.  Right occipital stroke -US carotid arteries showed less than 39% stenosis bilaterally -echocardiogram showed normal left ventricle function, moderate asymmetric left ventricular hypertrophy of the basal septal segment, right ventricle systolic click functions normal, left atrial size was severely dilated (seeing cardiology) -Advised to continue aspirin 81 mg daily and atorvastatin 10 mg daily for secondary stroke prevention  2.  Cardiac arrhythmia -Per cards note nonsustained  VTAC, paroxysmal SVT, frequent PVC -On metoprolol, amlodipine, Benzapril, atorvastatin per cardiology    Follow-up in 3 months with Dr. [name] or call earlier if needed    CC:  [place] provider: Dr. Kandis Ban, MD   I spent 34 minutes of face-to-face and non-face-to-face time with patient and daughter.  This included previsit chart review, lab review, order entry, study review, electronic health record documentation, patient and daughter education and discussion regarding recurrent spells possible partial seizure, use of zonisamide with potential side effects, prior stroke with secondary stroke prevention measures and importance of aggressive stroke risk factor management and answered all other questions to patient and daughter satisfaction  Frann Rider, AGNP-BC  [facility] [address] [place], [place] [address]  Phone [phone] Fax [phone] Note: This document was prepared with digital dictation and possible smart phrase technology. Any transcriptional errors that result from this process are unintentional.

## 2021-08-30 NOTE — Patient Instructions (Addendum)
Your Plan:  Start zonisamde 100mg  nightly for possible seizures    Follow up with Dr. [name] in 3 months or call earlier if needed      Thank you for coming to see Danielle Vaughan at [facility]. I hope we have been able to provide you high quality care today.  You may receive a patient satisfaction survey over the next few weeks. We would appreciate your feedback and comments so that we may continue to improve ourselves and the health of our patients.   Zonisamide Capsules What is this medication? ZONISAMIDE (zoe NIS a mide) prevents and controls seizures in people with epilepsy. It works by calming overactive nerves in your body. This medicine may be used for other purposes; ask your health care provider or pharmacist if you have questions. COMMON BRAND NAME(S): Zonegran What should I tell my care team before I take this medication? They need to know if you have any of these conditions: Kidney disease Liver disease Low levels of bicarbonate in your blood Lung disease Suicidal thoughts, plans, or attempt An unusual or allergic reaction to zonisamide, sulfa medications, other medications, foods, dyes, or preservatives Pregnant or trying to get pregnant Breast-feeding How should I use this medication? Take this medication by mouth with a glass of water. Follow the directions on the prescription label. Do not cut, crush or chew this medication. Swallow the capsules whole. You can take this medication with or without food. If it upsets your stomach, take it with food. Take your medication at regular intervals. Do not take it more often than directed. Do not stop taking except on your care team's advice. A special MedGuide will be given to you by the pharmacist with each prescription and refill. Be sure to read this information carefully each time. Talk to your care team about the use of this medication in children. While this medication may be prescribed for children as young as 16  years for selected conditions, precautions do apply. Overdosage: If you think you have taken too much of this medicine contact a poison control center or emergency room at once. NOTE: This medicine is only for you. Do not share this medicine with others. What if I miss a dose? If you miss a dose, take it as soon as you can. If it is almost time for your next dose, take only that dose. Do not take double or extra doses. What may interact with this medication? Acetazolamide Alcohol Antihistamines for allergy, cough, and cold Certain medications for anxiety or sleep Certain medications for depression, such as amitriptyline, fluoxetine, sertraline Certain medications for seizures, such as carbamazepine, phenobarbital, phenytoin, primidone, topiramate Dichlorphenamide General anesthetics, such as halothane, isoflurane, methoxyflurane, propofol Medications that relax muscles for surgery Opioid medications for pain or cough Phenothiazines, such as chlorpromazine, mesoridazine, prochlorperazine, thioridazine Rifampin This list may not describe all possible interactions. Give your health care provider a list of all the medicines, herbs, non-prescription drugs, or dietary supplements you use. Also tell them if you smoke, drink alcohol, or use illegal drugs. Some items may interact with your medicine. What should I watch for while using this medication? Visit your care team for regular checks on your progress. Tell your care team if your symptoms do not start to get better or if they get worse. Wear a medical ID bracelet or chain. Carry a card that describes your condition. List the medications and doses you take on the card. Do not suddenly stop taking this medication. You may develop  a severe reaction. Your care team will tell you how much medication to take. If your care team wants you to stop the medication, the dose may be slowly lowered over time to avoid any side effects. This medication may  affect your coordination, reaction time, or judgement. Do not drive or operate machinery until you know how this medication affects you. Sit up or stand slowly to reduce the risk of dizzy or fainting spells. Drinking alcohol with this medication can increase the risk of these side effects. Tell your care team right away if you have any change in your eyesight. This medication may cause serious skin reactions. They can happen weeks to months after starting the medication. Contact your care team right away if you notice fevers or flu-like symptoms with a rash. The rash may be red or purple and then turn into blisters or peeling of the skin. You may also notice a red rash with swelling of the face, lips, or lymph nodes in your neck or under your arms. Watch for new or worsening thoughts of suicide or depression. This includes sudden changes in mood, behaviors, or thoughts. These changes can happen at any time but are more common in the beginning of treatment or after a change in dose. Call your care team right away if you experience these thoughts or worsening depression. Talk to your care team if you wish to become pregnant or think you might be pregnant. This medication can cause serious birth defects. What side effects may I notice from receiving this medication? Side effects that you should report to your care team as soon as possible: Allergic reactions--skin rash, itching, hives, swelling of the face, lips, tongue, or throat Aplastic anemia--unusual weakness or fatigue, dizziness, headache, trouble breathing, increased bleeding or bruising CNS depression--slow or shallow breathing, shortness of breath, feeling faint, dizziness, confusion, trouble staying awake Fever that does not go away, decreased sweating High acid level--trouble breathing, unusual weakness or fatigue, confusion, headache, fast or irregular heartbeat, nausea, vomiting High ammonia level--unusual weakness or fatigue, confusion, loss  of appetite, nausea, vomiting, seizures Infection--fever, chills, cough, or sore throat Kidney stones--blood in urine, pain or trouble passing urine, pain in the lower back or sides Rash, fever, and swollen lymph nodes Redness, blistering, peeling, or loosening of the skin, including inside the mouth Sudden eye pain or change in vision such as blurry vision, seeing halos around lights, vision loss Thoughts of suicide or self harm, worsening mood, feelings of depression Side effects that usually do not require medical attention (report to your care team if they continue or are bothersome): Difficulty with paying attention, memory, or speech Dizziness Drowsiness Irritability Loss of appetite Loss of balance or coordination Slow or sluggish movements of the body This list may not describe all possible side effects. Call your doctor for medical advice about side effects. You may report side effects to FDA at 1-800-FDA-1088. Where should I keep my medication? Keep out of the reach of children and pets. Store at room temperature between 20 and 25 degrees C (68 and 77 degrees F). Protect from light. Get rid of any unused medication after the expiration date. To get rid of medications that are no longer needed or have expired: Take the medication to a medication take-back program. Check with your pharmacy or law enforcement to find a location. If you cannot return the medication, check the label or package insert to see if the medication should be thrown out in the garbage or flushed down  the toilet. If you are not sure, ask your care team. If it is safe to put it in the trash, pour the medication out of the container. Mix the medication with cat litter, dirt, coffee grounds, or other unwanted substance. Seal the mixture in a bag or container. Put it in the trash. NOTE: This sheet is a summary. It may not cover all possible information. If you have questions about this medicine, talk to your doctor,  pharmacist, or health care provider.  2022 Elsevier/Gold Standard (2021-04-24 00:00:00)

## 2021-09-01 NOTE — Progress Notes (Signed)
Chart reviewed, agree above plan ?

## 2021-09-03 ENCOUNTER — Ambulatory Visit: Payer: Medicare Other | Admitting: Adult Health

## 2021-09-07 ENCOUNTER — Telehealth: Payer: Self-pay | Admitting: Adult Health

## 2021-09-07 NOTE — Telephone Encounter (Signed)
Pt's daughter called has some questions and concerns about the zonisamide (ZONEGRAN) 100 MG capsule. Also wanting to know does the medication affect memory.pt's daughter requesting refill.

## 2021-09-10 MED ORDER — ZONISAMIDE 50 MG PO CAPS: 50.0000 mg | ORAL_CAPSULE | Freq: Every day | ORAL | 5 refills | Status: DC

## 2021-09-10 NOTE — Telephone Encounter (Signed)
I contacted the daughter back and relayed instructions. She was agreeable and will let us know how she fares with this medication change.

## 2021-09-10 NOTE — Telephone Encounter (Signed)
Can try zonisamide 50mg  daily to see if she tolerates this dose better with hopes of eventually being able to increase dose back to 100mg  dosing. New rx sent to pharmacy. If causing fatigue, she can take at bedtime if not already doing so.

## 2021-09-10 NOTE — Telephone Encounter (Signed)
I returned daughters call and we discussed message.  She sts the pt started the Zonisamide 100 mg  1 per day on 09/01/2021 and continued until 09/06/2021. Pt reported she stopped the medication due to upset stomach, decreased energy, and forgetfulness. I advised daughter these are commonly reported s/e of the medication and improvement can be seen once the body has time to adjust to the new med.  Daughter reports patient is hesitant on going back on the medication. We did discuss the possibility of trying a lower dosage and see if symptoms improved. Daughter sts pt would be ok with trying or along with any other recommendation by NP as well. Advised I would call back once dicussed with NP.

## 2021-09-10 NOTE — Addendum Note (Signed)
Addended by: Frann Rider L on: 09/10/2021 10:36 AM   Modules accepted: Orders

## 2021-10-17 DIAGNOSIS — I10 Essential (primary) hypertension: Secondary | ICD-10-CM | POA: Diagnosis not present

## 2021-10-17 DIAGNOSIS — E782 Mixed hyperlipidemia: Secondary | ICD-10-CM | POA: Diagnosis not present

## 2021-10-17 DIAGNOSIS — R7303 Prediabetes: Secondary | ICD-10-CM | POA: Diagnosis not present

## 2021-10-17 DIAGNOSIS — N182 Chronic kidney disease, stage 2 (mild): Secondary | ICD-10-CM | POA: Diagnosis not present

## 2021-10-24 DIAGNOSIS — R569 Unspecified convulsions: Secondary | ICD-10-CM | POA: Diagnosis not present

## 2021-10-24 DIAGNOSIS — I10 Essential (primary) hypertension: Secondary | ICD-10-CM | POA: Diagnosis not present

## 2021-10-24 DIAGNOSIS — E782 Mixed hyperlipidemia: Secondary | ICD-10-CM | POA: Diagnosis not present

## 2021-10-24 DIAGNOSIS — R7303 Prediabetes: Secondary | ICD-10-CM | POA: Diagnosis not present

## 2021-10-24 DIAGNOSIS — N182 Chronic kidney disease, stage 2 (mild): Secondary | ICD-10-CM | POA: Diagnosis not present

## 2021-11-28 ENCOUNTER — Ambulatory Visit: Payer: Medicare Other | Admitting: Adult Health

## 2021-11-29 ENCOUNTER — Encounter: Payer: Self-pay | Admitting: Neurology

## 2021-11-29 ENCOUNTER — Ambulatory Visit (INDEPENDENT_AMBULATORY_CARE_PROVIDER_SITE_OTHER): Payer: Medicare Other | Admitting: Neurology

## 2021-11-29 VITALS — BP 151/95 | HR 70 | Ht 61.0 in | Wt 132.5 lb

## 2021-11-29 DIAGNOSIS — M170 Bilateral primary osteoarthritis of knee: Secondary | ICD-10-CM | POA: Diagnosis not present

## 2021-11-29 DIAGNOSIS — R569 Unspecified convulsions: Secondary | ICD-10-CM | POA: Diagnosis not present

## 2021-11-29 DIAGNOSIS — R9389 Abnormal findings on diagnostic imaging of other specified body structures: Secondary | ICD-10-CM

## 2021-11-29 DIAGNOSIS — R7309 Other abnormal glucose: Secondary | ICD-10-CM

## 2021-11-29 DIAGNOSIS — I639 Cerebral infarction, unspecified: Secondary | ICD-10-CM

## 2021-11-29 DIAGNOSIS — R413 Other amnesia: Secondary | ICD-10-CM

## 2021-11-29 NOTE — Progress Notes (Addendum)
Primary neurologist: Dr. [name]  Reason for visit:  Transient neurological symptoms Hx of occipital stroke   Chief Complaint  Patient presents with   Follow-up    Rm 12. Accompanied by daughter, [name]. Pt reports blood pressure has been stable. D/c Zonegran due to side effects. Pt reports tingling in both hands, right more significant (after a fall a few months ago). C/o blurry vision, not as severe as previous visit. Sickness after eating has improved. C/o issues with vision when eating. Would like to know what a concerning BP would be for her.      HISTORY OF PRESENT ILLNESS: Danielle Vaughan is a 88 year old female, seen in request by her primary care physician [name], [name] for evaluation of abnormal CT scan, she is accompanied by her daughter [name] at today's visit on November 15, 2019,   I have reviewed and summarized the referring note from the referring physician.  She had past medical history of hypertension, hyperlipidemia,   Since September 2020, she reported recurrent similar episode of blurry vision, right hand weakness following her evening meal on Sunday.  It happens intermittently,   She had a gradual decreased appetite over the years, but still active, able to have help with household chores, she tends to have smaller meal on Sunday, by evening time, when she finished her evening meal, she will often get blurry vision, right hand weakness, to the point of difficulty picking up her fork, symptoms last about 15 minutes, she denied loss of consciousness, denied increased gait abnormality, she has significant right knee pain is a candidate for right knee replacement, ambulate with a cane   For that reason, she was referred for CAT scan, I have personally reviewed CT head without contrast on November 11, 2019, generalized atrophy, supratentorium small vessel disease, subacute right parieto-occipital PCAs infarction   She was started on aspirin 81 mg daily, and  Lipitor 10 mg every night since February 11   Update December 08, 2019: At previous visit, she complains of spells only at Sunday, but on further questioning, she reported intermittent spells couple times each week, sudden onset mild confusion, slow reaction, funny feelings, with some visual distortion, sometimes right arm numbness, heaviness.  She describes 1 episode on November 23, 2019, after visiting her son at nursing home, she was sitting in the car with the bright sunshine, she suddenly felt funny, mild visual distortion, right arm heaviness, lasting 10 to 15 minutes, she denied loss of consciousness.  Her daughter also described, when she was having a spells, she often was noticed by days into space, but when she interact with patient, she was a bit slow to react, but able to answer questions   She was seen by cardiologist, had 2 weeks cardiac monitoring, visit and reports are pending   I personally reviewed MRI of the brain without contrast on November 19, 2019 from [facility], subacute infarction at the right occipital lobe, moderate supratentorium small vessel disease   US carotid artery showed less than 39% stenosis bilaterally Echocardiogram showed normal left ventricular function, moderate asymmetric left ventricular hypertrophy of the basal septal segment, right ventricle systolic click functions normal, left atrial size with severely dilated  UPDATE Oct 30 2020 Dr. [name]: She is accompanied by her daughter going to today's clinical visit, she has not been compliant with her Keppra xr 500 mg every night, daughter reported that with Keppra, she is doing better, had much less spells of sudden onset blurry vision, right  hand weakness, confusion, at least 75% improvement, We again reviewed MRI report, right occipital stroke, moderate supratentorium small vessel disease EEG October 2021 showed no significant abnormality  UPDATE November 29 2021: She is accompanied by her daughter at today's  clinical visit, she lives with her daughter and son, still trying to be active at home,  She continue complains of intermittent dizziness, overwhelming sensation episode often associated with her meals, she was put on Keppra 500 mg twice daily in 2021 for similar complaints, suspicious for partial seizure, she reported significant improvement at that time  In addition, MRI of the brain in 2021 showed subacute infarction of the right occipital lobe, moderate supratentorium small vessel disease  Then she began to noticed side effect with Keppra, very weak complaints, seems to be dizziness, lightheaded sensation, she stopped the Keppra, was switched from Zonegran 50 mg every night since December 2022, she still complains of not feeling well, contributed to [place]  She is still able to do some house chores, but increased memory loss, today's MoCA examination 15/30,  She continued complaints recurrent spells of not feeling well, blurry vision, couple times each month, lasting for 10 minutes, both patient and her daughter are very resistant taking antiepileptic medications, will take of Zonegran now,   REVIEW OF SYSTEMS: Out of a complete 14 system review of symptoms, the patient complains only of the following symptoms listed in HPI, and all other reviewed systems are negative.    ALLERGIES: Allergies  Allergen Reactions   Cefoxitin    Ceftin [Cefuroxime Axetil] Other (See Comments)    Seeing lights   Codeine Other (See Comments)    Unknown    HOME MEDICATIONS: Outpatient Medications Prior to Visit  Medication Sig Dispense Refill   amLODipine (NORVASC) 5 MG tablet Take 5 mg by mouth at bedtime.      aspirin EC 81 MG tablet Take 81 mg by mouth daily. Swallow whole.     atorvastatin (LIPITOR) 10 MG tablet Take 10 mg by mouth daily.     benazepril (LOTENSIN) 40 MG tablet Take 40 mg by mouth at bedtime.      metoprolol tartrate (LOPRESSOR) 50 MG tablet Take 50 mg by mouth 2 (two) times  daily. 100mg  1x a day     zonisamide (ZONEGRAN) 50 MG capsule Take 1 capsule (50 mg total) by mouth daily. 30 capsule 5   No facility-administered medications prior to visit.    PAST MEDICAL HISTORY: Past Medical History:  Diagnosis Date   Hypertension    Stroke [facility])     PAST SURGICAL HISTORY: Past Surgical History:  Procedure Laterality Date   PARTIAL HYSTERECTOMY      FAMILY HISTORY: Family History  Adopted: Yes  Problem Relation Age of Onset   Other Mother        unsure of history   Other Father        unsure of history    SOCIAL HISTORY: Social History   Socioeconomic History   Marital status: Widowed    Spouse name: Not on file   Number of children: 6   Years of education: 10th grade   Highest education level: Not on file  Occupational History   Occupation: Retired  Tobacco Use   Smoking status: Never   Smokeless tobacco: Never  Substance and Sexual Activity   Alcohol use: No   Drug use: Never   Sexual activity: Never  Other Topics Concern   Not on file  Social History Narrative  Right-handed.   No daily use of caffeine.   She lives at home with three of her children (she has four living).   Social Determinants of Health   Financial Resource Strain: Not on file  Food Insecurity: Not on file  Transportation Needs: Not on file  Physical Activity: Not on file  Stress: Not on file  Social Connections: Not on file  Intimate Partner Violence: Not on file    PHYSICAL EXAM  Vitals:   11/29/21 1511  BP: (!) 151/95  Pulse: 70  Weight: 132 lb 8 oz (60.1 kg)  Height: 5\' 1"  (1.549 m)     Body mass index is 25.04 kg/m.  Generalized: Well developed, in no acute distress    PHYSICAL EXAMNIATION:  Gen: NAD, conversant, very pleasant elderly Caucasian female, well nourised, well groomed                     Cardiovascular: Regular rate rhythm, no peripheral edema, warm, nontender. Eyes: Conjunctivae clear without exudates or hemorrhage Neck:  Supple, no carotid bruits. Pulmonary: Clear to auscultation bilaterally   NEUROLOGICAL EXAM:  MENTAL STATUS: Speech/Cognition: Awake, alert, normal speech, oriented to history taking and casual conversation.  Montreal Cognitive Assessment  11/29/2021  Visuospatial/ Executive (0/5) 1  Naming (0/3) 0  Attention: Read list of digits (0/2) 2  Attention: Read list of letters (0/1) 1  Attention: Serial 7 subtraction starting at 100 (0/3) 1  Language: Repeat phrase (0/2) 2  Language : Fluency (0/1) 0  Abstraction (0/2) 2  Delayed Recall (0/5) 0  Orientation (0/6) 6  Total 15     CRANIAL NERVES: CN II: Visual fields are full to confrontation.  Pupils are round equal and briskly reactive to light. CN III, IV, VI: extraocular movement are normal. No ptosis. CN V: Facial sensation is intact to light touch. CN VII: Face is symmetric with normal eye closure and smile. CN VIII: Hearing is normal to casual conversation CN IX, X: Palate elevates symmetrically. Phonation is normal. CN XI: Head turning and shoulder shrug are intact CN XII: Tongue is midline with normal movements and no atrophy.  MOTOR: Muscle bulk and tone are normal. Muscle strength is normal.  REFLEXES: Reflexes are 2  and symmetric at the biceps, triceps, knees and ankles. Plantar responses are flexor.  SENSORY: Intact to light touch, pinprick, positional and vibratory sensation at fingers and toes.  COORDINATION: There is no trunk or limb ataxia.    GAIT/STANCE: She needs push-up to get up from seated position, unsteady with use of cane    ASSESSMENT AND PLAN 88 y.o. year old female    Recurrent spells of blurry vision, right hand weakness  Unsure etiology, she did reported significant improvement taking Keppra, then reported could not tolerate Keppra, or even low-dose zonisamide,  Will stop anti-epileptic medications,  Repeat EEG  Continue to observe her symptoms, differentiation diagnoses also include  hypoglycemia episode, presyncope, advised her daughter to videotape the episode, check her glucose, vital signs  Dementia  MoCA examination 15/30,  Laboratory evaluation to rule out treatable etiology  History of right occipital stroke -US carotid arteries showed less than 39% stenosis bilaterally -echocardiogram showed normal left ventricle function, moderate asymmetric left ventricular hypertrophy of the basal septal segment, right ventricle systolic click functions normal, left atrial size was severely dilated (seeing cardiology) -Advised to continue aspirin 81 mg daily and atorvastatin 10 mg daily for secondary stroke prevention  Cardiac arrhythmia -Cardiac event monitor in March 2021, -  She was seen by Cardiologist: Nonsustained ventricular tachycardia, paroxysmal SVT, frequent PVCs per monitor: -she is asymptomatic, keep metoprolol  Marcial Pacas, M.D. Ph.D.  [facility] [place], [place] [address] Phone: [phone] Fax:      [phone]   Total time spent reviewing the chart, obtaining history, examined patient, ordering tests, documentation, consultations and family, care coordination was 56 minutes

## 2021-11-30 LAB — SEDIMENTATION RATE: Sed Rate: 46 mm/hr — ABNORMAL HIGH (ref 0–40)

## 2021-11-30 LAB — RPR: RPR Ser Ql: NONREACTIVE

## 2021-11-30 LAB — TSH: TSH: 0.313 u[IU]/mL — ABNORMAL LOW (ref 0.450–4.500)

## 2021-11-30 LAB — HGB A1C W/O EAG: Hgb A1c MFr Bld: 6 % — ABNORMAL HIGH (ref 4.8–5.6)

## 2021-11-30 LAB — VITAMIN B12: Vitamin B-12: 265 pg/mL (ref 232–1245)

## 2021-11-30 LAB — ANA W/REFLEX IF POSITIVE: Anti Nuclear Antibody (ANA): NEGATIVE

## 2021-11-30 LAB — C-REACTIVE PROTEIN: CRP: 11 mg/L — ABNORMAL HIGH (ref 0–10)

## 2021-11-30 NOTE — Addendum Note (Signed)
Addended by: Marcial Pacas on: 11/30/2021 01:36 PM ? ? Modules accepted: Orders ? ?

## 2021-12-04 ENCOUNTER — Telehealth: Payer: Self-pay | Admitting: Neurology

## 2021-12-04 NOTE — Telephone Encounter (Signed)
I spoke to her daughter, [name] (on [place]). She verbalized understanding and will have her mother follow up with her PCP.  ?

## 2021-12-04 NOTE — Telephone Encounter (Signed)
Please call patient, laboratory evaluation showed mildly decreased TSH 0.31, suggest thyroid malfunction, possible hyperthyroidism, ? ?Mildly elevated A1c 6.0-indicating mildly elevated glucose level past few months, ? ?Mildly elevated C-reactive protein, ESR of unknown clinical significance, ? ?I have forwarded lab result to her primary care physician [name], MD she is to follow-up with her primary care physician  ?

## 2022-01-30 DIAGNOSIS — I447 Left bundle-branch block, unspecified: Secondary | ICD-10-CM | POA: Diagnosis not present

## 2022-01-30 DIAGNOSIS — R531 Weakness: Secondary | ICD-10-CM | POA: Diagnosis not present

## 2022-01-30 DIAGNOSIS — R42 Dizziness and giddiness: Secondary | ICD-10-CM | POA: Diagnosis not present

## 2022-01-30 DIAGNOSIS — I499 Cardiac arrhythmia, unspecified: Secondary | ICD-10-CM | POA: Diagnosis not present

## 2022-01-31 ENCOUNTER — Telehealth: Payer: Self-pay | Admitting: Cardiology

## 2022-01-31 NOTE — Telephone Encounter (Signed)
Left message to call back for nurse at [facility] office call back to get copy of EKG and office note ?

## 2022-01-31 NOTE — Telephone Encounter (Signed)
Pt's daughter is requesting call back in regards to EKG done that was being sent over from the PCP.  ?

## 2022-01-31 NOTE — Telephone Encounter (Signed)
Returned call to patient's daughter (DPR on file) to answer questions regarding EKG done yesterday in PCPs office. Patient's daughter was concerned regarding information given that "something was wrong with the left side of her mother's heart."  PCP's APP told Ms.Fedrick she would send the EKG over to cardiology. EKG was not located, PCP's office contacted, message left requesting copy of EKG.  Dr Harrell Gave aware, patient's daughter was contacted let her know at this time without having the EKG to look at it is difficult to know exactly what is showed, and we need that information before determining next steps. Explained to Ms. Lundblad (pt's daughter) if pt has symptom changes such as chest pain, shortness of breath, syncopal episode, etc. To take pt to the ED.  Ms.Stiger verbalized understanding. TSuits RN [place] CCM ?

## 2022-02-01 NOTE — Telephone Encounter (Signed)
RN called PCP office to try to get EKG sent over as patient was told. RN spoke with front desk staff who is to have the EKG faxed today. Fax number given.  ?

## 2022-02-01 NOTE — Telephone Encounter (Signed)
EKG reviewed from PCP office. EKG reveals SR 62 bpm with known LBBB and occasional PVC. This is stable compared to previous.  The left bundle branch block is not a new finding as notated by provider at PCP. ? ?Labs collected 01/29/2022 revealed:  B12 289, TSH 0.54, vit D 9.8 (low), Hb 11.8.  ? ?No significant anemia to cause lightheadedness.  Vitamin D is low which would cause fatigue but not lightheadedness-management per primary care provider.  ? ?We will ask RN team to call and relay the following: ?Low suspicion LBBB or PVC are causative of dizziness. As these are not new findings they are not of concern, appears PCP office was simply unaware of known LBBB. ?Recommend staying well-hydrated, making position changes slowly, wear knee-high compression stockings to ensure orthostasis noncontributory. ?Check blood pressure and heart rate once per day and keep a log to bring to next appointment.  If BP consistently less than 110/60, call our office. Of note, heart rate may be falsely low as PVCs (early beats) are not always captured accurately by pulse ox or BP cuff. ?Recommend follow-up with Dr. Harrell Gave or APP to discuss dizziness. ? ?Danielle Dubonnet, NP  ? ? ?

## 2022-02-01 NOTE — Telephone Encounter (Signed)
Reviewed recommendations with daughter and scheduled appointment with Dr Harrell Gave 5/15 at 2:20 pm  ?Patient does have times where she can not pick up something with her right hand but not all the time ?Daughter will take her to ED if lightheaded/dizziness returns and does not subside  ?

## 2022-02-01 NOTE — Telephone Encounter (Signed)
Received EKG, given to Alvina Filbert, LPN who is to call patient with review from Dr. Harrell Gave ?

## 2022-02-04 ENCOUNTER — Emergency Department (HOSPITAL_COMMUNITY)
Admission: EM | Admit: 2022-02-04 | Discharge: 2022-02-04 | Disposition: A | Discharge status: Home / Self Care | Payer: Medicare Other | Attending: Emergency Medicine | Admitting: Emergency Medicine

## 2022-02-04 ENCOUNTER — Encounter (HOSPITAL_COMMUNITY): Payer: Self-pay | Admitting: Emergency Medicine

## 2022-02-04 ENCOUNTER — Emergency Department (HOSPITAL_COMMUNITY): Payer: Medicare Other

## 2022-02-04 DIAGNOSIS — R42 Dizziness and giddiness: Secondary | ICD-10-CM | POA: Diagnosis not present

## 2022-02-04 DIAGNOSIS — G319 Degenerative disease of nervous system, unspecified: Secondary | ICD-10-CM | POA: Diagnosis not present

## 2022-02-04 DIAGNOSIS — Z7982 Long term (current) use of aspirin: Secondary | ICD-10-CM | POA: Diagnosis not present

## 2022-02-04 DIAGNOSIS — J3489 Other specified disorders of nose and nasal sinuses: Secondary | ICD-10-CM | POA: Diagnosis not present

## 2022-02-04 LAB — BASIC METABOLIC PANEL
Anion gap: 9 (ref 5–15)
BUN: 12 mg/dL (ref 8–23)
CO2: 23 mmol/L (ref 22–32)
Calcium: 8.6 mg/dL — ABNORMAL LOW (ref 8.9–10.3)
Chloride: 105 mmol/L (ref 98–111)
Creatinine, Ser: 0.91 mg/dL (ref 0.44–1.00)
GFR, Estimated: >60 mL/min (ref >60)
Glucose, Bld: 140 mg/dL — ABNORMAL HIGH (ref 70–99)
Potassium: 4.2 mmol/L (ref 3.5–5.1)
Sodium: 137 mmol/L (ref 135–145)

## 2022-02-04 LAB — URINALYSIS, MICROSCOPIC (REFLEX)

## 2022-02-04 LAB — CBC
HCT: 36.4 % (ref 36.0–46.0)
Hemoglobin: 12.2 g/dL (ref 12.0–15.0)
MCH: 31.7 pg (ref 26.0–34.0)
MCHC: 33.5 g/dL (ref 30.0–36.0)
MCV: 94.5 fL (ref 80.0–100.0)
Platelets: 567 10*3/uL — ABNORMAL HIGH (ref 150–400)
RBC: 3.85 MIL/uL — ABNORMAL LOW (ref 3.87–5.11)
RDW: 17.9 % — ABNORMAL HIGH (ref 11.5–15.5)
WBC: 5.5 10*3/uL (ref 4.0–10.5)
nRBC: 0 % (ref 0.0–0.2)

## 2022-02-04 LAB — URINALYSIS, ROUTINE W REFLEX MICROSCOPIC
Glucose, UA: NEGATIVE mg/dL
Hgb urine dipstick: NEGATIVE
Ketones, ur: 15 mg/dL — AB
Nitrite: NEGATIVE
Protein, ur: NEGATIVE mg/dL
Specific Gravity, Urine: 1.025 (ref 1.005–1.030)
pH: 5.5 (ref 5.0–8.0)

## 2022-02-04 LAB — CBG MONITORING, ED: Glucose-Capillary: 123 mg/dL — ABNORMAL HIGH (ref 70–99)

## 2022-02-04 NOTE — ED Triage Notes (Signed)
Pt endorses intermittent dizziness for 2-3 days. States she saw her PCP and told to come to ER if it continues. Pt spoke with cardiologist and told her EKG had no changes.  ?

## 2022-02-04 NOTE — ED Provider Notes (Signed)
?[facility] ?Provider Note ? ? ?CSN: [id number] ?Arrival date & time: 02/04/22  1116 ? ?  ? ?History ? ?Chief Complaint  ?Patient presents with  ? Dizziness  ? ? ?Danielle SPRUIELL is a 88 y.o. female with a PMH significant for previous occipital stroke, SVT, partial seizures who presents with concern for intermittent dizziness for the last 2-3 days. She denies weakness, numbness, confusion. She denies vision changes other than a chronic complaint that sometimes her vision gets hazy when she is eating which has been going on for years. She denies any falls. She denies chest pain, shortness of breath, leg swelling. Denies fever, chills. She was seen by her PCP, had recent normal EKG, told to come to ED if symptoms persist. No acute distress or change today. Dizziness lasts for a few minutes at a time before spontaneously resolving. No ear pain, tinnitus. ? ? ?Dizziness ? ?  ? ?Home Medications ?Prior to Admission medications   ?Medication Sig Start Date End Date Taking? Authorizing Provider  ?amLODipine (NORVASC) 5 MG tablet Take 5 mg by mouth at bedtime.     [provider]  ?aspirin EC 81 MG tablet Take 81 mg by mouth daily. Swallow whole.    [provider]  ?atorvastatin (LIPITOR) 10 MG tablet Take 10 mg by mouth daily. 11/11/19   [provider]  ?benazepril (LOTENSIN) 40 MG tablet Take 40 mg by mouth at bedtime.     [provider]  ?metoprolol tartrate (LOPRESSOR) 50 MG tablet Take 50 mg by mouth 2 (two) times daily. '100mg'$  1x a day    [provider]  ?   ? ?Allergies    ?Cefoxitin, Ceftin [cefuroxime axetil], and Codeine   ? ?Review of Systems   ?Review of Systems  ?Neurological:  Positive for dizziness.  ?All other systems reviewed and are negative. ? ?Physical Exam ?Updated Vital Signs ?BP 134/79   Pulse 62   Temp 98.2 ?F (36.8 ?C) (Oral)   Resp (!) 27   Ht '5\' 1"'$  (1.549 m)   Wt 60 kg   SpO2 99%   BMI 24.99 kg/m?   ?Physical Exam ?Vitals and nursing note reviewed.  ?Constitutional:   ?   General: She is not in acute distress. ?   Appearance: Normal appearance.  ?HENT:  ?   Head: Normocephalic and atraumatic.  ?Eyes:  ?   General:     ?   Right eye: No discharge.     ?   Left eye: No discharge.  ?Cardiovascular:  ?   Rate and Rhythm: Normal rate and regular rhythm.  ?   Heart sounds: No murmur heard. ?  No friction rub. No gallop.  ?Pulmonary:  ?   Effort: Pulmonary effort is normal.  ?   Breath sounds: Normal breath sounds.  ?Abdominal:  ?   General: Bowel sounds are normal.  ?   Palpations: Abdomen is soft.  ?Skin: ?   General: Skin is warm and dry.  ?   Capillary Refill: Capillary refill takes less than 2 seconds.  ?Neurological:  ?   Mental Status: She is alert and oriented to person, place, and time.  ?   Comments: Cranial nerves II through XII grossly intact.  Intact finger-nose, intact heel-to-shin.  Patient did not pass Romberg, gait not assessed.  Alert and oriented x3 with some confusion, hesitancy on questioning.  Moves all 4 limbs spontaneously, normal coordination.  No pronator drift.  Intact strength  5 out of 5 bilateral upper and lower extremities. ? ?  ?Psychiatric:     ?   Mood and Affect: Mood normal.     ?   Behavior: Behavior normal.  ? ? ?ED Results / Procedures / Treatments   ?Labs ?(all labs ordered are listed, but only abnormal results are displayed) ?Labs Reviewed  ?BASIC METABOLIC PANEL - Abnormal; Notable for the following components:  ?    Result Value  ? Glucose, Bld 140 (*)   ? Calcium 8.6 (*)   ? All other components within normal limits  ?CBC - Abnormal; Notable for the following components:  ? RBC 3.85 (*)   ? RDW 17.9 (*)   ? Platelets 567 (*)   ? All other components within normal limits  ?URINALYSIS, ROUTINE W REFLEX MICROSCOPIC - Abnormal; Notable for the following components:  ? APPearance HAZY (*)   ? Bilirubin Urine SMALL (*)   ? Ketones, ur 15 (*)   ? Leukocytes,Ua TRACE (*)   ? All  other components within normal limits  ?URINALYSIS, MICROSCOPIC (REFLEX) - Abnormal; Notable for the following components:  ? Bacteria, UA FEW (*)   ? All other components within normal limits  ?CBG MONITORING, ED - Abnormal; Notable for the following components:  ? Glucose-Capillary 123 (*)   ? All other components within normal limits  ? ? ?EKG ?None ? ?Radiology ?CT Head Wo Contrast ? ?Result Date: 02/04/2022 ?CLINICAL DATA:  Vertigo, central EXAM: CT HEAD WITHOUT CONTRAST TECHNIQUE: Contiguous axial images were obtained from the base of the skull through the vertex without intravenous contrast. RADIATION DOSE REDUCTION: This exam was performed according to the departmental dose-optimization program which includes automated exposure control, adjustment of the mA and/or kV according to patient size and/or use of iterative reconstruction technique. COMPARISON:  CT head November 11, 2019. FINDINGS: Brain: Mildly increased conspicuity of chronic cortical gyriform hyperdensity within the right parieto-occipital lobes (right PCA territory), compatible with cortical laminar necrosis. Mildly increased hypodensity in this region. No definite acute hemorrhage, mass lesion, midline shift, or hydrocephalus. Vascular: Vascular calcifications.  No hyperdense vessel identified. Skull: No acute fracture. Sinuses/Orbits: Mild paranasal sinus mucosal thickening. No acute orbital findings. Other: No mastoid effusions. IMPRESSION: Cortical laminar necrosis and calcification at the site of prior right PCA territory infarct. Mildly increased hypodensity in this region probably represents evolution of encephalomalacia/gliosis. If there is concern for acute peri-infarct ischemia, recommend MRI to better evaluate. Electronically Signed   By: Margaretha Sheffield M.D.   On: 02/04/2022 13:07  ? ?MR BRAIN WO CONTRAST ? ?Result Date: 02/04/2022 ?CLINICAL DATA:  Neuro deficit, acute, stroke suspected EXAM: MRI HEAD WITHOUT CONTRAST TECHNIQUE:  Multiplanar, multiecho pulse sequences of the brain and surrounding structures were obtained without intravenous contrast. COMPARISON:  CT head from the same day. FINDINGS: Brain: Encephalomalacia and gliosis in the right occipital lobe, compatible with prior infarct. Mild cortical/gyral T1 hyperintensity in this region probably represents cortical laminar necrosis given appearance on CT head. No evidence of acute infarct. Mild to moderate scattered additional T2/FLAIR hyperintensities in the white matter, nonspecific but compatible with chronic microvascular ischemic disease. No mass lesion, midline shift, extra-axial fluid collection, hydrocephalus, or acute hemorrhage. Mild for age cerebral atrophy. Vascular: Major arterial flow voids are maintained skull base. Skull and upper cervical spine: Normal marrow signal. Sinuses/Orbits: Right maxillary sinus retention cyst. Mild left sphenoid sinus mucosal thickening. Otherwise, sinuses are clear. Unremarkable orbits. Other: No sizable mastoid effusions. IMPRESSION: 1. No evidence of  acute intracranial abnormality. 2. Remote right PCA territory infarct with cortical laminar necrosis. 3. Mild for age chronic microvascular ischemic disease and cerebral atrophy (ICD10-G31.9). Electronically Signed   By: Margaretha Sheffield M.D.   On: 02/04/2022 17:18   ? ?Procedures ?Procedures  ? ? ?Medications Ordered in ED ?Medications - No data to display ? ?ED Course/ Medical Decision Making/ A&P ?  ?                        ?Medical Decision Making ?Amount and/or Complexity of Data Reviewed ?Labs: ordered. ?Radiology: ordered. ? ? ?This patient presents to the ED for concern of intermittent dizziness, this involves an extensive number of treatment options, and is a complaint that carries with it a high risk of complications and morbidity. The emergent differential diagnosis prior to evaluation includes, but is not limited to,  Peripheral ear pathology, central acoustic pathology,  occipital or other posterior hemispheric stroke, infection of inner ear, hypotension, arrhythmia versus other .  ? ?This is not an exhaustive differential.  ? ?Past Medical History / Co-morbidities / Social History: ?History of CVA

## 2022-02-04 NOTE — ED Notes (Signed)
Patient ambulated to bathroom with cane with minium assistance.  ?

## 2022-02-04 NOTE — Discharge Instructions (Addendum)
Your results today do not show any evidence of a new stroke on your CT or MRI.  It is possible that some of your dizziness is related to the old stroke that you had, I do not see any evidence of ear infection or vertigo additionally.  You may want to drink a few more glasses of water a day to help in case a very mild dehydration is contributing to your feeling of dizziness.  Continue take your vitamin D supplements as you may be perceiving your overall weakness as dizziness/lightheadedness. ? ?Please touch base and follow-up with your primary care doctor in the next few days, return to the emergency department if you have worsening dizziness, weakness, facial droop, confusion.  It was a pleasure taking care of you today. ?

## 2022-02-04 NOTE — ED Notes (Signed)
Purewick placed at this time; patient and family updated on the plan of care both verbalize understanding. Call bell in reach ?

## 2022-02-11 ENCOUNTER — Ambulatory Visit (INDEPENDENT_AMBULATORY_CARE_PROVIDER_SITE_OTHER): Payer: Medicare Other | Admitting: Cardiology

## 2022-02-11 ENCOUNTER — Encounter (HOSPITAL_BASED_OUTPATIENT_CLINIC_OR_DEPARTMENT_OTHER): Payer: Self-pay | Admitting: Cardiology

## 2022-02-11 VITALS — BP 136/70 | HR 59 | Ht 61.0 in | Wt 130.0 lb

## 2022-02-11 DIAGNOSIS — I493 Ventricular premature depolarization: Secondary | ICD-10-CM | POA: Diagnosis not present

## 2022-02-11 DIAGNOSIS — Z8673 Personal history of transient ischemic attack (TIA), and cerebral infarction without residual deficits: Secondary | ICD-10-CM

## 2022-02-11 DIAGNOSIS — R42 Dizziness and giddiness: Secondary | ICD-10-CM | POA: Diagnosis not present

## 2022-02-11 DIAGNOSIS — M170 Bilateral primary osteoarthritis of knee: Secondary | ICD-10-CM | POA: Diagnosis not present

## 2022-02-11 DIAGNOSIS — I10 Essential (primary) hypertension: Secondary | ICD-10-CM

## 2022-02-11 DIAGNOSIS — I639 Cerebral infarction, unspecified: Secondary | ICD-10-CM

## 2022-02-11 NOTE — Progress Notes (Signed)
Cardiology Office Note:    Date:  02/11/2022   ID:  Danielle Vaughan, DOB April 01, 1933, MRN [id number]  PCP:  Danielle Seashore, MD  Cardiologist:  Danielle Dresser, MD  Referring MD: Danielle Seashore, MD   CC: followup  History of Present Illness:    Danielle Vaughan is a 88 y.o. female with a hx of hypertension, hyperlipidemia who is seen for follow up today. I initially met her 12/09/19 as a new consult at the request of Danielle Seashore, MD for the evaluation and management of stroke.  Neuro history: History of intermittent blurry vision, right hand weakness since 06/2019. She had CT head for these symptoms, which noted generalized atrophy, supratentorial small vessel disease, and subacute right parieto-occipital PCA infarct.   Zio monitor 12/06/19: 14 days. Min HR 42 bpm, max HR 138 bpm, avg 62 bpm. 1 run of NSVT of 4 beats, avg rate 115 bpm. 41 PSVT events, fastest was 8 beats at 138 bpm, longest was 13 beats at 106 bpm. PVC burden 15.8%. Rare atrial ectopy  Today: Has had some dizziness. Saw her PCP, told it wasn't vertigo. Presented to ER 02/04/22, had CT, MRI, and evaluation. Has not had dizziness since that time.   Can't feel her PVCs. Takes metoprolol all 100 mg in the AM. Discussed this, she is not sure if this is metoprolol succinate or tartate.  ROS positive for intermittent right hand strange sensation. Denies weakness.   Denies chest pain, shortness of breath at rest or with normal exertion. No PND, orthopnea, LE edema or unexpected weight gain. No syncope or palpitations.   Past Medical History:  Diagnosis Date   Hypertension    Stroke [facility])     Past Surgical History:  Procedure Laterality Date   PARTIAL HYSTERECTOMY      Current Medications: Current Outpatient Medications on File Prior to Visit  Medication Sig   amLODipine (NORVASC) 5 MG tablet Take 5 mg by mouth at bedtime.    aspirin EC 81 MG tablet Take 81 mg by mouth daily. Swallow whole.    benazepril (LOTENSIN) 40 MG tablet Take 40 mg by mouth at bedtime.    metoprolol tartrate (LOPRESSOR) 50 MG tablet Take 50 mg by mouth 2 (two) times daily. $RemoveBefo'100mg'iOMsWhrcbod$  1x a day   atorvastatin (LIPITOR) 10 MG tablet Take 10 mg by mouth daily.   No current facility-administered medications on file prior to visit.     Allergies:   Cefoxitin, Ceftin [cefuroxime axetil], and Codeine   Social History   Tobacco Use   Smoking status: Never   Smokeless tobacco: Never  Substance Use Topics   Alcohol use: No   Drug use: Never    Family History: family history includes Other in her father and mother. She was adopted.  ROS:   Please see the history of present illness.  Additional pertinent ROS otherwise unremarkable.  EKGs/Labs/Other Studies Reviewed:    The following studies were reviewed today: Echo 12/28/19   1. Left ventricular ejection fraction, by estimation, is 55 to 60%. Left  ventricular ejection fraction by 3D volume is 55 %. The left ventricle has  normal function. The left ventricle has no regional wall motion  abnormalities. There is moderate  asymmetric left ventricular hypertrophy of the basal-septal segment. Left  ventricular diastolic parameters are consistent with Grade II diastolic  dysfunction (pseudonormalization). Elevated left atrial pressure. The  average left ventricular global  longitudinal strain is -18.0 %.   2. Right ventricular systolic function is normal.  The right ventricular  size is normal. There is moderately elevated pulmonary artery systolic  pressure. The estimated right ventricular systolic pressure is 36.6 mmHg.   3. Left atrial size was severely dilated.   4. Right atrial size was mildly dilated.   5. The mitral valve is normal in structure. Mild mitral valve  regurgitation.   6. The aortic valve is tricuspid. Aortic valve regurgitation is not  visualized. No aortic stenosis is present.   7. The inferior vena cava is dilated in size with >50%  respiratory  variability, suggesting right atrial pressure of 8 mmHg.   EKG:  EKG is personally reviewed.   06/06/21 sinus rhythm with sinus arrhythmia, with PVCs, LBBB 12/09/19 sinus rhythm with PVCs  Recent Labs: 11/29/2021: TSH 0.313 02/04/2022: BUN 12; Creatinine, Ser 0.91; Hemoglobin 12.2; Platelets 567; Potassium 4.2; Sodium 137  Recent Lipid Panel No results found for: CHOL, TRIG, HDL, CHOLHDL, VLDL, LDLCALC, LDLDIRECT  Physical Exam:    VS:  BP 136/70   Pulse (!) 59   Ht $R'5\' 1"'zt$  (1.549 m)   Wt 130 lb (59 kg)   SpO2 97%   BMI 24.56 kg/m     Orthostatic VS for the past 24 hrs (Last 3 readings):  BP- Lying Pulse- Lying BP- Sitting Pulse- Sitting BP- Standing at 0 minutes Pulse- Standing at 0 minutes BP- Standing at 3 minutes Pulse- Standing at 3 minutes  02/11/22 1430 144/70 58 136/70 (!) 40 135/80 81 151/67 (!) 40     Wt Readings from Last 3 Encounters:  02/11/22 130 lb (59 kg)  02/04/22 132 lb 4.4 oz (60 kg)  11/29/21 132 lb 8 oz (60.1 kg)    GEN: Well nourished, well developed in no acute distress HEENT: Normal, moist mucous membranes NECK: No JVD CARDIAC: regular rhythm with occasional premature beats, normal S1 and S2, no rubs or gallops. No murmur. VASCULAR: Radial and DP pulses 2+ bilaterally. No carotid bruits RESPIRATORY:  Clear to auscultation without rales, wheezing or rhonchi  ABDOMEN: Soft, non-tender, non-distended MUSCULOSKELETAL:  Ambulates independently SKIN: Warm and dry, no edema NEUROLOGIC:  Alert and oriented x 3. No focal neuro deficits noted. PSYCHIATRIC:  Normal affect    ASSESSMENT:    1. Frequent PVCs   2. History of CVA (cerebrovascular accident)   3. Essential hypertension   4. Dizziness      PLAN:    Dizziness: -not orthostatic -had neuro workup -symptoms have since resolved -no syncope; reviewed red flag signs that need immediate medical attention  History of stroke: -CT with subacute right parieto-occipital PCA infarct, with  generalized atrophy and small vessel changes -she has not been on aspirin, but denies issues with it. From my perspective, with history of CVA, would continue 81 mg daily -continue atorvastatin 10 mg -echo, monitor as above -CV risk factor modification, as below.  Nonsustained ventricular tachycardia, paroxysmal SVT, frequent PVCs per monitor: -she is asymptomatic -echo as above -already on metoprolol, continue. Family will confirm if this is succinate vs. Tartrate -need to do manual or pulse ox pulse. Machine reads artificially low with PVCs  Hypertension: at goal today -continue amlodipine 5 mg daily -continue benazepril 40 mg daily -continue metoprolol succinate 100 mg daily  Hyperlipidemia: -continue atorvastatin 10 mg daily  Cardiac risk counseling and prevention recommendations: -recommend heart healthy/Mediterranean diet, with whole grains, fruits, vegetable, fish, lean meats, nuts, and olive oil. Limit salt. -recommend moderate walking, 3-5 times/week for 30-50 minutes each session. Aim for at least 150 minutes.week.  Goal should be pace of 3 miles/hours, or walking 1.5 miles in 30 minutes -recommend avoidance of tobacco products. Avoid excess alcohol.   Plan for follow up: 6 mos or sooner as needed  Danielle Dresser, MD, PhD [place]  [facility]    Medication Adjustments/Labs and Tests Ordered: Current medicines are reviewed at length with the patient today.  Concerns regarding medicines are outlined above.  No orders of the defined types were placed in this encounter.  No orders of the defined types were placed in this encounter.  Patient Instructions  Medication Instructions:  Your Physician recommend you continue on your current medication as directed.    Check your metoprolol bottle.   Long acting is metoprolol SUCCINATE. If this is your current medication, ok to take 100 mg metoprolol succinate in the morning.  Short acting is metoprolol TARTRATE.  If this is what you are taking, call me and we will change to the long acting version.   *If you need a refill on your cardiac medications before your next appointment, please call your pharmacy*   Lab Work: None ordered today   Testing/Procedures: None ordered today   Follow-Up: At [facility], you and your health needs are our priority.  As part of our continuing mission to provide you with exceptional heart care, we have created designated Provider Care Teams.  These Care Teams include your primary Cardiologist (physician) and Advanced Practice Providers (APPs -  Physician Assistants and Nurse Practitioners) who all work together to provide you with the care you need, when you need it.  We recommend signing up for the patient portal called "MyChart".  Sign up information is provided on this After Visit Summary.  MyChart is used to connect with patients for Virtual Visits (Telemedicine).  Patients are able to view lab/test results, encounter notes, upcoming appointments, etc.  Non-urgent messages can be sent to your provider as well.   To learn more about what you can do with MyChart, go to [email or web address].    Your next appointment:   6 month(s)  The format for your next appointment:   In Person  Provider:   Buford Dresser, MD{   Important Information About Sugar       Signed, Danielle Dresser, MD PhD 02/11/2022  [employer]

## 2022-02-11 NOTE — Patient Instructions (Addendum)
Medication Instructions:  ?Your Physician recommend you continue on your current medication as directed.   ? ?Check your metoprolol bottle.  ? ?Long acting is metoprolol SUCCINATE. If this is your current medication, ok to take 100 mg metoprolol succinate in the morning. ? ?Short acting is metoprolol TARTRATE. If this is what you are taking, call me and we will change to the long acting version.  ? ?*If you need a refill on your cardiac medications before your next appointment, please call your pharmacy* ? ? ?Lab Work: ?None ordered today ? ? ?Testing/Procedures: ?None ordered today ? ? ?Follow-Up: ?At [facility], you and your health needs are our priority.  As part of our continuing mission to provide you with exceptional heart care, we have created designated Provider Care Teams.  These Care Teams include your primary Cardiologist (physician) and Advanced Practice Providers (APPs -  Physician Assistants and Nurse Practitioners) who all work together to provide you with the care you need, when you need it. ? ?We recommend signing up for the patient portal called "MyChart".  Sign up information is provided on this After Visit Summary.  MyChart is used to connect with patients for Virtual Visits (Telemedicine).  Patients are able to view lab/test results, encounter notes, upcoming appointments, etc.  Non-urgent messages can be sent to your provider as well.   ?To learn more about what you can do with MyChart, go to [email or web address].   ? ?Your next appointment:   ?6 month(s) ? ?The format for your next appointment:   ?In Person ? ?Provider:   ?Buford Dresser, MD{ ? ? ?Important Information About Sugar ? ? ? ? ? ?

## 2022-02-12 ENCOUNTER — Telehealth (HOSPITAL_BASED_OUTPATIENT_CLINIC_OR_DEPARTMENT_OTHER): Payer: Self-pay | Admitting: Cardiology

## 2022-02-12 NOTE — Telephone Encounter (Signed)
Information requested from yesterdays appointment.  ?

## 2022-02-12 NOTE — Telephone Encounter (Signed)
? ?  Daughter of the patient called to report what medication the patient is taking as requested from her office visit yesterday.  ? ?Metoprolol Succinate ER 100 mg once daily ?

## 2022-02-12 NOTE — Telephone Encounter (Signed)
-  Nurse updated chart to reflect Metoprolol Succinate 100 mg daily. ?-Attempted to contact daughter to inform Dr. Harrell Gave would like pt to continue on current dose.  ?

## 2022-02-13 NOTE — Telephone Encounter (Signed)
Follow Up: ? ? ? ?Patient's daughter is returning Danielle Vaughan's call from yesterday. ?

## 2022-02-13 NOTE — Telephone Encounter (Signed)
Advised daughter, verbalized understanding  

## 2022-02-14 DIAGNOSIS — H35033 Hypertensive retinopathy, bilateral: Secondary | ICD-10-CM | POA: Diagnosis not present

## 2022-02-14 DIAGNOSIS — H35372 Puckering of macula, left eye: Secondary | ICD-10-CM | POA: Diagnosis not present

## 2022-02-14 DIAGNOSIS — H353132 Nonexudative age-related macular degeneration, bilateral, intermediate dry stage: Secondary | ICD-10-CM | POA: Diagnosis not present

## 2022-02-14 DIAGNOSIS — H35363 Drusen (degenerative) of macula, bilateral: Secondary | ICD-10-CM | POA: Diagnosis not present

## 2022-02-28 DIAGNOSIS — N182 Chronic kidney disease, stage 2 (mild): Secondary | ICD-10-CM | POA: Diagnosis not present

## 2022-02-28 DIAGNOSIS — R569 Unspecified convulsions: Secondary | ICD-10-CM | POA: Diagnosis not present

## 2022-02-28 DIAGNOSIS — R5383 Other fatigue: Secondary | ICD-10-CM | POA: Diagnosis not present

## 2022-02-28 DIAGNOSIS — E782 Mixed hyperlipidemia: Secondary | ICD-10-CM | POA: Diagnosis not present

## 2022-02-28 DIAGNOSIS — R7303 Prediabetes: Secondary | ICD-10-CM | POA: Diagnosis not present

## 2022-02-28 DIAGNOSIS — I10 Essential (primary) hypertension: Secondary | ICD-10-CM | POA: Diagnosis not present

## 2022-04-01 ENCOUNTER — Ambulatory Visit (INDEPENDENT_AMBULATORY_CARE_PROVIDER_SITE_OTHER): Payer: Medicare Other | Admitting: Neurology

## 2022-04-01 DIAGNOSIS — R9389 Abnormal findings on diagnostic imaging of other specified body structures: Secondary | ICD-10-CM

## 2022-04-01 DIAGNOSIS — R413 Other amnesia: Secondary | ICD-10-CM

## 2022-04-01 DIAGNOSIS — R410 Disorientation, unspecified: Secondary | ICD-10-CM

## 2022-04-01 DIAGNOSIS — I639 Cerebral infarction, unspecified: Secondary | ICD-10-CM

## 2022-04-01 DIAGNOSIS — R7309 Other abnormal glucose: Secondary | ICD-10-CM

## 2022-04-03 DIAGNOSIS — M170 Bilateral primary osteoarthritis of knee: Secondary | ICD-10-CM | POA: Diagnosis not present

## 2022-04-09 DIAGNOSIS — I10 Essential (primary) hypertension: Secondary | ICD-10-CM | POA: Diagnosis not present

## 2022-04-09 DIAGNOSIS — Z0000 Encounter for general adult medical examination without abnormal findings: Secondary | ICD-10-CM | POA: Diagnosis not present

## 2022-04-09 DIAGNOSIS — E782 Mixed hyperlipidemia: Secondary | ICD-10-CM | POA: Diagnosis not present

## 2022-04-09 DIAGNOSIS — I447 Left bundle-branch block, unspecified: Secondary | ICD-10-CM | POA: Diagnosis not present

## 2022-04-09 DIAGNOSIS — D473 Essential (hemorrhagic) thrombocythemia: Secondary | ICD-10-CM | POA: Diagnosis not present

## 2022-04-09 DIAGNOSIS — R7303 Prediabetes: Secondary | ICD-10-CM | POA: Diagnosis not present

## 2022-04-09 DIAGNOSIS — N182 Chronic kidney disease, stage 2 (mild): Secondary | ICD-10-CM | POA: Diagnosis not present

## 2022-04-09 DIAGNOSIS — I471 Supraventricular tachycardia: Secondary | ICD-10-CM | POA: Diagnosis not present

## 2022-04-09 DIAGNOSIS — R569 Unspecified convulsions: Secondary | ICD-10-CM | POA: Diagnosis not present

## 2022-04-09 DIAGNOSIS — I639 Cerebral infarction, unspecified: Secondary | ICD-10-CM | POA: Diagnosis not present

## 2022-04-09 NOTE — Procedures (Signed)
   HISTORY: 88 year old female, reported recurrent episode of blurry vision, right hand weakness  TECHNIQUE:  This is a routine 16 channel EEG recording with one channel devoted to a limited EKG recording.  It was performed during wakefulness, drowsiness and asleep.  Hyperventilation and photic stimulation were performed as activating procedures.  There are minimum muscle and movement artifact noted.  Upon maximum arousal, posterior dominant waking rhythm consistent of mildly dysrhythmic low amplitude theta range activity no deeper's. Activities are symmetric over the bilateral posterior derivations and attenuated with eye opening.  Hyperventilation produced mild/moderate buildup with higher amplitude and the slower activities noted.  Photic stimulation did not alter the tracing.  During EEG recording, patient developed drowsiness and no deeper stage  of sleep was achieved  During EEG recording, there was no epileptiform discharge noted.  EKG demonstrate irregular cardiac rhythm, 60 bpm  CONCLUSION: This is a mildly abnormal awake EEG.  There is evidence of mild background slowing, indicating mild bihemispheric malfunction, common etiology of metabolic toxic.  Marcial Pacas, M.D. Ph.D.  [facility] [place], [place] [address] Phone: [phone] Fax:      [phone]

## 2022-04-10 DIAGNOSIS — M170 Bilateral primary osteoarthritis of knee: Secondary | ICD-10-CM | POA: Diagnosis not present

## 2022-04-28 ENCOUNTER — Emergency Department (HOSPITAL_BASED_OUTPATIENT_CLINIC_OR_DEPARTMENT_OTHER)
Admission: EM | Admit: 2022-04-28 | Discharge: 2022-04-28 | Disposition: A | Discharge status: Home / Self Care | Payer: Medicare Other | Attending: Emergency Medicine | Admitting: Emergency Medicine

## 2022-04-28 ENCOUNTER — Other Ambulatory Visit: Payer: Self-pay

## 2022-04-28 ENCOUNTER — Encounter (HOSPITAL_BASED_OUTPATIENT_CLINIC_OR_DEPARTMENT_OTHER): Payer: Self-pay

## 2022-04-28 ENCOUNTER — Emergency Department (HOSPITAL_BASED_OUTPATIENT_CLINIC_OR_DEPARTMENT_OTHER): Payer: Medicare Other

## 2022-04-28 DIAGNOSIS — Z79899 Other long term (current) drug therapy: Secondary | ICD-10-CM | POA: Insufficient documentation

## 2022-04-28 DIAGNOSIS — R42 Dizziness and giddiness: Secondary | ICD-10-CM | POA: Insufficient documentation

## 2022-04-28 DIAGNOSIS — Z7982 Long term (current) use of aspirin: Secondary | ICD-10-CM | POA: Insufficient documentation

## 2022-04-28 DIAGNOSIS — H538 Other visual disturbances: Secondary | ICD-10-CM | POA: Diagnosis not present

## 2022-04-28 LAB — CBC
HCT: 37 % (ref 36.0–46.0)
Hemoglobin: 12.2 g/dL (ref 12.0–15.0)
MCH: 30.6 pg (ref 26.0–34.0)
MCHC: 33 g/dL (ref 30.0–36.0)
MCV: 92.7 fL (ref 80.0–100.0)
Platelets: 481 K/uL — ABNORMAL HIGH (ref 150–400)
RBC: 3.99 MIL/uL (ref 3.87–5.11)
RDW: 17.7 % — ABNORMAL HIGH (ref 11.5–15.5)
WBC: 5.9 K/uL (ref 4.0–10.5)
nRBC: 0 % (ref 0.0–0.2)

## 2022-04-28 LAB — BASIC METABOLIC PANEL
Anion gap: 8 (ref 5–15)
BUN: 12 mg/dL (ref 8–23)
CO2: 28 mmol/L (ref 22–32)
Calcium: 8.6 mg/dL — ABNORMAL LOW (ref 8.9–10.3)
Chloride: 105 mmol/L (ref 98–111)
Creatinine, Ser: 0.75 mg/dL (ref 0.44–1.00)
GFR, Estimated: >60 mL/min (ref >60)
Glucose, Bld: 79 mg/dL (ref 70–99)
Potassium: 3.7 mmol/L (ref 3.5–5.1)
Sodium: 141 mmol/L (ref 135–145)

## 2022-04-28 NOTE — ED Notes (Signed)
Pt had no problem while ambulating. No, CP, SOB, numbness/tingling or any other issue. Ambulated with her walker, which she normally ambulates with.

## 2022-04-28 NOTE — Discharge Instructions (Addendum)
The test today in the ED were reassuring.  As we discussed, there is no clear cause for your symptoms today but it could be related to the dizzy spells you have been having in the past.  Follow-up with your primary doctor and consider seeing your cardiologist and neurologist for further evaluation.  Return to the hospital if you have any recurrent or worsening symptoms

## 2022-04-28 NOTE — ED Notes (Signed)
Pt was walked in ED without any complaints or difficulties. Pt stated she feels much better.

## 2022-04-28 NOTE — ED Triage Notes (Signed)
She noted herself to feel "kinda dizzy--not spinning, but just kinda unsteady" [sic] upon awakening this morning. She states that she has noticed also subsequent to this that she had a "feeling in my head like I've never had before--not pain" [sic]. She also tells me that she has a vague visual disturbance "just not seeing right". She is ambulatory with her walker and her daughter is with her. She is alert and oriented x 4 with clear speech.

## 2022-04-28 NOTE — ED Provider Notes (Signed)
[facility] DEPT Provider Note   CSN: [id number] Arrival date & time: 04/28/22  1321     History  Chief Complaint  Patient presents with   dizziness with visual disturbance    Danielle Vaughan is a 88 y.o. female.  HPI   Pt felt dizzy and weak this am.  She felt a little lightheaded.  No room spinning.  No trouble moving arms or legs.  No trouble with speech.  Pt is able to walk using her walker (she has knee trouble).  Pt felt like her vision was not as normal as usual, not blurry though.  No fevers.  No cp or abd pain.  No headache.  The symptoms lasted a little longer than 15 minutes.  She sat in a chair. It eventually resolved. Pt states this is not the first time it has happened.  She has had some testing.  They have not found the reason.  The episodes last 15 minutes. Home Medications Prior to Admission medications   Medication Sig Start Date End Date Taking? Authorizing Provider  amLODipine (NORVASC) 5 MG tablet Take 5 mg by mouth at bedtime.     [provider]  aspirin EC 81 MG tablet Take 81 mg by mouth daily. Swallow whole.    [provider]  atorvastatin (LIPITOR) 10 MG tablet Take 10 mg by mouth daily. 11/11/19   [provider]  benazepril (LOTENSIN) 40 MG tablet Take 40 mg by mouth at bedtime.     [provider]  metoprolol succinate (TOPROL-XL) 100 MG 24 hr tablet Take 100 mg by mouth daily. Take with or immediately following a meal.    [provider]      Allergies    Cefoxitin, Ceftin [cefuroxime axetil], and Codeine    Review of Systems   Review of Systems  Physical Exam Updated Vital Signs BP (!) 153/88   Pulse (!) 48   Temp 97.9 F (36.6 C)   Resp 15   SpO2 97%  Physical Exam Vitals and nursing note reviewed.  Constitutional:      General: She is not in acute distress.    Appearance: She is well-developed.  HENT:     Head: Normocephalic and atraumatic.     Right Ear:  External ear normal.     Left Ear: External ear normal.  Eyes:     General: No scleral icterus.       Right eye: No discharge.        Left eye: No discharge.     Conjunctiva/sclera: Conjunctivae normal.  Neck:     Trachea: No tracheal deviation.  Cardiovascular:     Rate and Rhythm: Normal rate and regular rhythm.  Pulmonary:     Effort: Pulmonary effort is normal. No respiratory distress.     Breath sounds: Normal breath sounds. No stridor. No wheezing or rales.  Abdominal:     General: Bowel sounds are normal. There is no distension.     Palpations: Abdomen is soft.     Tenderness: There is no abdominal tenderness. There is no guarding or rebound.  Musculoskeletal:        General: No tenderness.     Cervical back: Neck supple.  Skin:    General: Skin is warm and dry.     Findings: No rash.  Neurological:     Mental Status: She is alert and oriented to person, place, and time.     Cranial Nerves: No cranial nerve deficit.  Sensory: No sensory deficit.     Motor: No abnormal muscle tone or seizure activity.     Coordination: Coordination normal.     Comments: No pronator drift bilateral upper extrem, able to hold both legs off bed for 5 seconds, sensation intact in all extremities, no visual field cuts, no left or right sided neglect, normal finger-nose exam bilaterally, no nystagmus noted  No facial droop, extraocular movements intact, tongue midline     ED Results / Procedures / Treatments   Labs (all labs ordered are listed, but only abnormal results are displayed) Labs Reviewed  BASIC METABOLIC PANEL - Abnormal; Notable for the following components:      Result Value   Calcium 8.6 (*)    All other components within normal limits  CBC - Abnormal; Notable for the following components:   RDW 17.7 (*)    Platelets 481 (*)    All other components within normal limits    EKG EKG Interpretation  Date/Time:  Sunday April 28 2022 13:54:21 EDT Ventricular Rate:   53 PR Interval:  152 QRS Duration: 139 QT Interval:  494 QTC Calculation: 464 R Axis:   -33 Text Interpretation: Sinus rhythm Ventricular premature complex Left bundle branch block No significant change since last tracing Confirmed by Dorie Rank [phone]) on 04/28/2022 3:17:00 PM  Radiology CT Head Wo Contrast  Result Date: 04/28/2022 CLINICAL DATA:  Patient awoke feeling dizzy. Previous infarct. EXAM: CT HEAD WITHOUT CONTRAST TECHNIQUE: Contiguous axial images were obtained from the base of the skull through the vertex without intravenous contrast. RADIATION DOSE REDUCTION: This exam was performed according to the departmental dose-optimization program which includes automated exposure control, adjustment of the mA and/or kV according to patient size and/or use of iterative reconstruction technique. COMPARISON:  CT head without contrast and MR head without contrast 02/04/2022 FINDINGS: Brain: Cortical laminar necrosis involving the right occipital and posterior parietal lobe is stable. Mild diffuse white matter disease is stable. No acute infarct, hemorrhage, or mass lesion is present. The ventricles are of normal size. No significant extraaxial fluid collection is present. The brainstem and cerebellum are within normal limits. Vascular: Atherosclerotic calcifications are present within the cavernous internal carotid arteries, stable. No hyperdense vessel is present. Skull: Calvarium is intact. No focal lytic or blastic lesions are present. No significant extracranial soft tissue lesion is present. Sinuses/Orbits: The paranasal sinuses and mastoid air cells are clear. Bilateral lens replacements are noted. Globes and orbits are otherwise unremarkable. IMPRESSION: 1. No acute intracranial abnormality or significant interval change. 2. Stable remote infarct involving the right occipital and posterior parietal lobe. 3. Stable mild diffuse white matter disease. This likely reflects the sequela of chronic  microvascular ischemia. Electronically Signed   By: San Morelle M.D.   On: 04/28/2022 16:10    Procedures Procedures    Medications Ordered in ED Medications - No data to display  ED Course/ Medical Decision Making/ A&P Clinical Course as of 04/28/22 1806  Sun Apr 28, 2022  1710 CT Head Wo Contrast Head CT without acute findings [JK]  1710 CBC(!) Normal [JK]  5176 Basic metabolic panel(!) Normal [JK]    Clinical Course User Index [JK] Dorie Rank, MD                           Medical Decision Making Problems Addressed: Lightheadedness: acute illness or injury that poses a threat to life or bodily functions  Amount and/or Complexity of Data  Reviewed External Data Reviewed: notes.    Details: Reviewed outpatient neurology cardiology notes Labs: ordered. Decision-making details documented in ED Course. Radiology: ordered and independent interpretation performed. Decision-making details documented in ED Course.   Patient presented to the ER for evaluation of dizziness.  Patient does have history of prior stroke.  She also has history of SVT.  Outpatient records have been reviewed and she has been followed by neurology and cardiology.  Patient previously wore a Zio patch.  ED work-up today is unremarkable.  No signs of dehydration.  No signs of anemia.  Patient has had some ectopy while she has been on the cardiac monitors but there is no extreme bradycardia or tachycardia.  No focal neurologic deficits on exam.  Was concerned about the possibility of stroke better head CT does not show any acute findings, she is able to ambulate without difficulty and I do not see any deficits on exam.  MRI is not available at this facility but I have low suspicion for recurrent stroke at this time and do not feel she needs emergent transfer for MRI.  Patient appears stable for discharge.  Discussed close outpatient follow-up with her primary care doctor and neurologist and  cardiologist.        Final Clinical Impression(s) / ED Diagnoses Final diagnoses:  Lightheadedness    Rx / DC Orders ED Discharge Orders     None         Dorie Rank, MD 04/28/22 1807

## 2022-05-07 ENCOUNTER — Telehealth: Payer: Self-pay

## 2022-05-07 NOTE — Telephone Encounter (Signed)
     Patient  visit on 7/31  at [facility] was for LIGHT HEADED  Have you been able to follow up with your primary care physician? NO  The patient was or was not able to obtain any needed medicine or equipment.YES  Are there diet recommendations that you are having difficulty following?NA  Patient expresses understanding of discharge instructions and education provided has no other needs at this time.[facility], Care Management  [phone] [address], [place], [place] [address] Phone: [phone] Email: [email or web address]

## 2022-05-29 DIAGNOSIS — M170 Bilateral primary osteoarthritis of knee: Secondary | ICD-10-CM | POA: Diagnosis not present

## 2022-06-06 ENCOUNTER — Ambulatory Visit (INDEPENDENT_AMBULATORY_CARE_PROVIDER_SITE_OTHER): Payer: Medicare Other | Admitting: Neurology

## 2022-06-06 ENCOUNTER — Encounter: Payer: Self-pay | Admitting: Neurology

## 2022-06-06 VITALS — BP 121/78 | HR 52 | Ht 61.0 in | Wt 137.0 lb

## 2022-06-06 DIAGNOSIS — R413 Other amnesia: Secondary | ICD-10-CM

## 2022-06-06 DIAGNOSIS — I639 Cerebral infarction, unspecified: Secondary | ICD-10-CM

## 2022-06-06 DIAGNOSIS — R569 Unspecified convulsions: Secondary | ICD-10-CM | POA: Diagnosis not present

## 2022-06-06 DIAGNOSIS — Z8673 Personal history of transient ischemic attack (TIA), and cerebral infarction without residual deficits: Secondary | ICD-10-CM

## 2022-06-06 NOTE — Progress Notes (Signed)
Patient: Danielle Vaughan Date of Birth: [birth year]  Reason for Visit: Follow up History from: Patient, daughter Primary Neurologist: Dr. [name]   ASSESSMENT AND PLAN 88 y.o. year old female   1.  Recurrent spells of blurry vision, right hand weakness -Has not been able to tolerate Keppra or Zonegran -EEG showed mild background slowing -We discussed 72 hr prolonged ambulatory EEG, she wishes to hold off, work on eating more consistent meals, with snacks, check blood sugar during spells -Hypoglycemia is in the differential, however suspicious for partial seizure   2.  Dementia -Can check MOCA at next visit, was 15/30 in March -Is very sharp today   3.  History of right occipital stroke -On aspirin 81 mg daily -Keep BP < 130/90, LDL < 70, A1C < 7.0  4.  Cardiac arrhythmia -Sees cardiology, cardiac monitor showed nonsustained V. tach, paroxysmal SVT; on metoprolol  HISTORY  Danielle Vaughan is a 88 year old female, seen in request by her primary care physician [name], [name] for evaluation of abnormal CT scan, she is accompanied by her daughter [name] at today's visit on November 15, 2019,   I have reviewed and summarized the referring note from the referring physician.  She had past medical history of hypertension, hyperlipidemia,   Since September 2020, she reported recurrent similar episode of blurry vision, right hand weakness following her evening meal on Sunday.  It happens intermittently,   She had a gradual decreased appetite over the years, but still active, able to have help with household chores, she tends to have smaller meal on Sunday, by evening time, when she finished her evening meal, she will often get blurry vision, right hand weakness, to the point of difficulty picking up her fork, symptoms last about 15 minutes, she denied loss of consciousness, denied increased gait abnormality, she has significant right knee pain is a candidate for right knee replacement,  ambulate with a cane   For that reason, she was referred for CAT scan, I have personally reviewed CT head without contrast on November 11, 2019, generalized atrophy, supratentorium small vessel disease, subacute right parieto-occipital PCAs infarction   She was started on aspirin 81 mg daily, and Lipitor 10 mg every night since February 11   Update December 08, 2019: At previous visit, she complains of spells only at Sunday, but on further questioning, she reported intermittent spells couple times each week, sudden onset mild confusion, slow reaction, funny feelings, with some visual distortion, sometimes right arm numbness, heaviness.  She describes 1 episode on November 23, 2019, after visiting her son at nursing home, she was sitting in the car with the bright sunshine, she suddenly felt funny, mild visual distortion, right arm heaviness, lasting 10 to 15 minutes, she denied loss of consciousness.  Her daughter also described, when she was having a spells, she often was noticed by days into space, but when she interact with patient, she was a bit slow to react, but able to answer questions   She was seen by cardiologist, had 2 weeks cardiac monitoring, visit and reports are pending   I personally reviewed MRI of the brain without contrast on November 19, 2019 from [facility], subacute infarction at the right occipital lobe, moderate supratentorium small vessel disease   US carotid artery showed less than 39% stenosis bilaterally Echocardiogram showed normal left ventricular function, moderate asymmetric left ventricular hypertrophy of the basal septal segment, right ventricle systolic click functions normal, left atrial size with severely dilated  UPDATE Oct 30 2020 Dr. [name]: She is accompanied by her daughter going to today's clinical visit, she has not been compliant with her Keppra xr 500 mg every night, daughter reported that with Keppra, she is doing better, had much less spells of sudden  onset blurry vision, right hand weakness, confusion, at least 75% improvement, We again reviewed MRI report, right occipital stroke, moderate supratentorium small vessel disease EEG October 2021 showed no significant abnormality   UPDATE November 29 2021: She is accompanied by her daughter at today's clinical visit, she lives with her daughter and son, still trying to be active at home,  She continue complains of intermittent dizziness, overwhelming sensation episode often associated with her meals, she was put on Keppra 500 mg twice daily in 2021 for similar complaints, suspicious for partial seizure, she reported significant improvement at that time  In addition, MRI of the brain in 2021 showed subacute infarction of the right occipital lobe, moderate supratentorium small vessel disease  Then she began to noticed side effect with Keppra, very weak complaints, seems to be dizziness, lightheaded sensation, she stopped the Keppra, was switched from Zonegran 50 mg every night since December 2022, she still complains of not feeling well, contributed to [place]  She is still able to do some house chores, but increased memory loss, today's MoCA examination 15/30,   She continued complaints recurrent spells of not feeling well, blurry vision, couple times each month, lasting for 10 minutes, both patient and her daughter are very resistant taking antiepileptic medications, will take of Zonegran now,  Update June 06, 2022 SS: In the ER 04/28/22 for dizziness, CT head showed no acute stroke. Last night spell of lightheadedness, she laid down for 20 minutes, spells often associated with blurry vision, right hand weakness. She hadn't eaten in awhile when this happened. Sometimes glucose could be low in the 80's. Most spells associated with long periods of time between eating.   Labs at last visit 11/29/21 A1c 6.0, TSH 0.313, RPR negative, B12 265, CRP 11, sed rate 46, ANA negative  REVIEW OF SYSTEMS: Out of  a complete 14 system review of symptoms, the patient complains only of the following symptoms, and all other reviewed systems are negative.  See HPI  ALLERGIES: Allergies  Allergen Reactions   Cefoxitin    Ceftin [Cefuroxime Axetil] Other (See Comments)    Seeing lights   Codeine Other (See Comments)    Unknown    HOME MEDICATIONS: Outpatient Medications Prior to Visit  Medication Sig Dispense Refill   amLODipine (NORVASC) 5 MG tablet Take 5 mg by mouth at bedtime.      aspirin EC 81 MG tablet Take 81 mg by mouth daily. Swallow whole.     atorvastatin (LIPITOR) 10 MG tablet Take 10 mg by mouth daily.     benazepril (LOTENSIN) 40 MG tablet Take 40 mg by mouth at bedtime.      metoprolol succinate (TOPROL-XL) 100 MG 24 hr tablet Take 100 mg by mouth daily. Take with or immediately following a meal.     Vitamin D, Ergocalciferol, 50000 units CAPS 1 capsule Orally for 30 day(s)     No facility-administered medications prior to visit.    PAST MEDICAL HISTORY: Past Medical History:  Diagnosis Date   Hypertension    Stroke [facility])     PAST SURGICAL HISTORY: Past Surgical History:  Procedure Laterality Date   PARTIAL HYSTERECTOMY      FAMILY HISTORY: Family History  Adopted: Yes  Problem Relation Age of Onset   Other Mother        unsure of history   Other Father        unsure of history    SOCIAL HISTORY: Social History   Socioeconomic History   Marital status: Widowed    Spouse name: Not on file   Number of children: 6   Years of education: 10th grade   Highest education level: Not on file  Occupational History   Occupation: Retired  Tobacco Use   Smoking status: Never   Smokeless tobacco: Never  Substance and Sexual Activity   Alcohol use: No   Drug use: Never   Sexual activity: Never  Other Topics Concern   Not on file  Social History Narrative   Right-handed.   No daily use of caffeine.   She lives at home with three of her children (she has four  living).   Social Determinants of Health   Financial Resource Strain: Not on file  Food Insecurity: Not on file  Transportation Needs: Not on file  Physical Activity: Not on file  Stress: Not on file  Social Connections: Not on file  Intimate Partner Violence: Not on file    PHYSICAL EXAM  Vitals:   06/06/22 1527  BP: 121/78  Pulse: (!) 52  Weight: 137 lb (62.1 kg)  Height: '5\' 1"'$  (1.549 m)   Body mass index is 25.89 kg/m.  Generalized: Well developed, in no acute distress  Neurological examination  Mentation: Alert oriented to time, place, history is provided by patient and her daughter, both are very pleasant. Follows all commands speech and language fluent Cranial nerve II-XII: Pupils were equal round reactive to light. Extraocular movements were full, visual field were full on confrontational test. Facial sensation and strength were normal. Head turning and shoulder shrug  were normal and symmetric. Motor: The motor testing reveals 5 over 5 strength of all 4 extremities. Good symmetric motor tone is noted throughout.  Sensory: Sensory testing is intact to soft touch on all 4 extremities. No evidence of extinction is noted.  Coordination: Cerebellar testing reveals good finger-nose-finger and heel-to-shin bilaterally.  Gait and station: Has to push off from seated position, gait is wide based, cautious, uses cane   Reflexes: Deep tendon reflexes are symmetric and normal bilaterally.   DIAGNOSTIC DATA (LABS, IMAGING, TESTING) - I reviewed patient records, labs, notes, testing and imaging myself where available.  Lab Results  Component Value Date   WBC 5.9 04/28/2022   HGB 12.2 04/28/2022   HCT 37.0 04/28/2022   MCV 92.7 04/28/2022   PLT 481 (H) 04/28/2022      Component Value Date/Time   NA 141 04/28/2022 1538   K 3.7 04/28/2022 1538   CL 105 04/28/2022 1538   CO2 28 04/28/2022 1538   GLUCOSE 79 04/28/2022 1538   BUN 12 04/28/2022 1538   CREATININE 0.75  04/28/2022 1538   CALCIUM 8.6 (L) 04/28/2022 1538   GFRNONAA >60 04/28/2022 1538   GFRAA >60 06/07/2017 1422   No results found for: "CHOL", "HDL", "LDLCALC", "LDLDIRECT", "TRIG", "CHOLHDL" Lab Results  Component Value Date   HGBA1C 6.0 (H) 11/29/2021   Lab Results  Component Value Date   VITAMINB12 265 11/29/2021   Lab Results  Component Value Date   TSH 0.313 (L) 11/29/2021    Butler Denmark, AGNP-C, DNP 06/06/2022, 4:10 PM [facility] [address], [place], [place] [address] [phone]

## 2022-06-06 NOTE — Patient Instructions (Signed)
Monitor spells, document trends, check blood sugar when spells occur  Continue aspirin 81 mg daily See you back in 6 months

## 2022-07-10 DIAGNOSIS — W19XXXA Unspecified fall, initial encounter: Secondary | ICD-10-CM | POA: Diagnosis not present

## 2022-07-10 DIAGNOSIS — R569 Unspecified convulsions: Secondary | ICD-10-CM | POA: Diagnosis not present

## 2022-08-14 ENCOUNTER — Ambulatory Visit (HOSPITAL_BASED_OUTPATIENT_CLINIC_OR_DEPARTMENT_OTHER): Payer: Medicare Other | Admitting: Cardiology

## 2022-09-02 DIAGNOSIS — M170 Bilateral primary osteoarthritis of knee: Secondary | ICD-10-CM | POA: Diagnosis not present

## 2022-10-03 ENCOUNTER — Ambulatory Visit (INDEPENDENT_AMBULATORY_CARE_PROVIDER_SITE_OTHER): Payer: Medicare Other | Admitting: Cardiology

## 2022-10-03 ENCOUNTER — Encounter (HOSPITAL_BASED_OUTPATIENT_CLINIC_OR_DEPARTMENT_OTHER): Payer: Self-pay | Admitting: Cardiology

## 2022-10-03 VITALS — BP 130/85 | HR 78 | Ht 61.0 in | Wt 129.0 lb

## 2022-10-03 DIAGNOSIS — R42 Dizziness and giddiness: Secondary | ICD-10-CM

## 2022-10-03 DIAGNOSIS — E785 Hyperlipidemia, unspecified: Secondary | ICD-10-CM | POA: Diagnosis not present

## 2022-10-03 DIAGNOSIS — I4729 Other ventricular tachycardia: Secondary | ICD-10-CM | POA: Diagnosis not present

## 2022-10-03 DIAGNOSIS — I493 Ventricular premature depolarization: Secondary | ICD-10-CM

## 2022-10-03 DIAGNOSIS — I10 Essential (primary) hypertension: Secondary | ICD-10-CM | POA: Diagnosis not present

## 2022-10-03 DIAGNOSIS — Z8673 Personal history of transient ischemic attack (TIA), and cerebral infarction without residual deficits: Secondary | ICD-10-CM | POA: Diagnosis not present

## 2022-10-03 NOTE — Patient Instructions (Addendum)
Medication Instructions:  Your physician recommends that you continue on your current medications as directed. Please refer to the Current Medication list given to you today.   Labwork: NONE  Testing/Procedures: NONE  Follow-Up: 03/28/2023 4:20 PM WITH DR Harrell Gave

## 2022-10-03 NOTE — Progress Notes (Signed)
Cardiology Office Note:    Date:  10/03/2022   ID:  Danielle Vaughan, DOB 04/21/1933, MRN [id number]  PCP:  [name], MD  Cardiologist:  Buford Dresser, MD  Referring MD: [name], MD   CC: follow-up  History of Present Illness:    Danielle Vaughan is a 89 y.o. female with a hx of CVA, hypertension, hyperlipidemia who is seen for follow up today. I initially met her 12/09/19 as a new consult at the request of [name], MD for the evaluation and management of stroke.  Neuro history: History of intermittent blurry vision, right hand weakness since 06/2019. She had CT head for these symptoms, which noted generalized atrophy, supratentorial small vessel disease, and subacute right parieto-occipital PCA infarct.   Zio monitor 12/06/19: 14 days. Min HR 42 bpm, max HR 138 bpm, avg 62 bpm. 1 run of NSVT of 4 beats, avg rate 115 bpm. 41 PSVT events, fastest was 8 beats at 138 bpm, longest was 13 beats at 106 bpm. PVC burden 15.8%. Rare atrial ectopy  At her last visit, she denied recurring dizziness. She had dizziness previously; saw her PCP, told it wasn't vertigo. Presented to ER 02/04/22, had CT, MRI, and evaluation. She denied being able to feel her PVCs. It was noted that she was taking 100 mg of metoprolol succinate in the AM, med list updated.  On 04/28/2022 she presented to the ED following a 15 minute episode of dizziness, weakness, and visual disturbance. Work-up was unremarkable; she had some ectopy while on cardiac monitors. No signs of dehydration, anemia, or focal neurologic deficits on exam. Her EKG showed NSR at 53 bpm, PVC, LBBB. She was discharged in stable condition.  Today, she is accompanied by a family member. She states she is feeling pretty good. However, she continues to have episodes of visual disturbances that seem to be occurring more often. Especially after eating, her vision will frequently become blurry for maybe 10-15 minutes before  spontaneously resolving.  She notes that any falls would likely be caused by her bilateral knee issues, as opposed to losing consciousness. She denies any syncope or racing, pounding heart beats.  In the mornings she only takes metoprolol. She takes the rest of her medications in the evenings. Her family reports that she sometimes does not take her aspirin every day.  Typically she monitors her diet and sodium intake. She avoids excessive amounts of pork or fried foods. Also avoids eating other certain foods due to acid reflux.  She denies any chest pain, shortness of breath, or peripheral edema. No headaches, orthopnea, or PND.   Past Medical History:  Diagnosis Date   Hypertension    Stroke [facility])     Past Surgical History:  Procedure Laterality Date   PARTIAL HYSTERECTOMY      Current Medications: Current Outpatient Medications on File Prior to Visit  Medication Sig   amLODipine (NORVASC) 5 MG tablet Take 5 mg by mouth at bedtime.    aspirin EC 81 MG tablet Take 81 mg by mouth daily. Swallow whole.   benazepril (LOTENSIN) 40 MG tablet Take 40 mg by mouth at bedtime.    metoprolol succinate (TOPROL-XL) 100 MG 24 hr tablet Take 100 mg by mouth daily. Take with or immediately following a meal.   Vitamin D, Ergocalciferol, 50000 units CAPS 1 capsule Orally for 30 day(s)   atorvastatin (LIPITOR) 10 MG tablet Take 10 mg by mouth daily. (Patient not taking: Reported on 10/03/2022)   No current  facility-administered medications on file prior to visit.     Allergies:   Cefoxitin, Ceftin [cefuroxime axetil], and Codeine   Social History   Tobacco Use   Smoking status: Never   Smokeless tobacco: Never  Substance Use Topics   Alcohol use: No   Drug use: Never    Family History: family history includes Other in her father and mother. She was adopted.  ROS:   Please see the history of present illness.   (+) Visual changes, blurry vision Additional pertinent ROS otherwise  unremarkable.  EKGs/Labs/Other Studies Reviewed:    The following studies were reviewed today:  Echo 12/28/19:  1. Left ventricular ejection fraction, by estimation, is 55 to 60%. Left  ventricular ejection fraction by 3D volume is 55 %. The left ventricle has  normal function. The left ventricle has no regional wall motion  abnormalities. There is moderate  asymmetric left ventricular hypertrophy of the basal-septal segment. Left  ventricular diastolic parameters are consistent with Grade II diastolic  dysfunction (pseudonormalization). Elevated left atrial pressure. The  average left ventricular global  longitudinal strain is -18.0 %.   2. Right ventricular systolic function is normal. The right ventricular  size is normal. There is moderately elevated pulmonary artery systolic  pressure. The estimated right ventricular systolic pressure is 123456 mmHg.   3. Left atrial size was severely dilated.   4. Right atrial size was mildly dilated.   5. The mitral valve is normal in structure. Mild mitral valve  regurgitation.   6. The aortic valve is tricuspid. Aortic valve regurgitation is not  visualized. No aortic stenosis is present.   7. The inferior vena cava is dilated in size with >50% respiratory  variability, suggesting right atrial pressure of 8 mmHg.   EKG:  EKG is personally reviewed.   10/03/2022:  SR with PBCs, LBBB at 78 bpm 06/06/21:  sinus rhythm with sinus arrhythmia, with PVCs, LBBB 12/09/19:  sinus rhythm with PVCs  Recent Labs: 11/29/2021: TSH 0.313 04/28/2022: BUN 12; Creatinine, Ser 0.75; Hemoglobin 12.2; Platelets 481; Potassium 3.7; Sodium 141   Recent Lipid Panel No results found for: "CHOL", "TRIG", "HDL", "CHOLHDL", "VLDL", "LDLCALC", "LDLDIRECT"  Physical Exam:    VS:  BP 130/85 (BP Location: Left Arm, Patient Position: Sitting, Cuff Size: Normal)   Pulse 78   Ht 5' 1"$  (1.549 m)   Wt 129 lb (58.5 kg)   BMI 24.37 kg/m     Wt Readings from Last 3  Encounters:  10/03/22 129 lb (58.5 kg)  06/06/22 137 lb (62.1 kg)  02/11/22 130 lb (59 kg)    GEN: Well nourished, well developed in no acute distress HEENT: Normal, moist mucous membranes NECK: No JVD CARDIAC: regular rhythm with occasional premature beats, normal S1 and S2, no rubs or gallops. No murmur. VASCULAR: Radial and DP pulses 2+ bilaterally. No carotid bruits RESPIRATORY:  Clear to auscultation without rales, wheezing or rhonchi  ABDOMEN: Soft, non-tender, non-distended MUSCULOSKELETAL:  Ambulates independently SKIN: Warm and dry, no edema NEUROLOGIC:  Alert and oriented x 3. No focal neuro deficits noted. PSYCHIATRIC:  Normal affect    ASSESSMENT:    1. Frequent PVCs   2. History of CVA (cerebrovascular accident)   3. Dizziness   4. Essential hypertension   5. NSVT (nonsustained ventricular tachycardia) (HCC)   6. Hyperlipidemia, unspecified hyperlipidemia type     PLAN:    Dizziness: -not orthostatic -had neuro workup -symptoms are sporadic, loss of vision/eating often associated -no syncope; reviewed red  flag signs that need immediate medical attention  History of stroke: -CT with subacute right parieto-occipital PCA infarct, with generalized atrophy and small vessel changes -continue 81 mg daily -continue atorvastatin 10 mg -echo, monitor as above -CV risk factor modification, as below.  Nonsustained ventricular tachycardia, paroxysmal SVT, frequent PVCs per monitor: -she is asymptomatic -echo as above -already on metoprolol, continue.  -need to do manual or pulse ox pulse. Machine reads artificially low with PVCs  Hypertension: near goal today -continue amlodipine 5 mg daily -continue benazepril 40 mg daily -continue metoprolol succinate 100 mg daily  Hyperlipidemia: -continue atorvastatin 10 mg daily  Cardiac risk counseling and prevention recommendations: -recommend heart healthy/Mediterranean diet, with whole grains, fruits, vegetable, fish,  lean meats, nuts, and olive oil. Limit salt. -recommend moderate walking, 3-5 times/week for 30-50 minutes each session. Aim for at least 150 minutes.week. Goal should be pace of 3 miles/hours, or walking 1.5 miles in 30 minutes -recommend avoidance of tobacco products. Avoid excess alcohol.   Plan for follow up: 6 months or sooner as needed  Buford Dresser, MD, PhD   [facility]    Medication Adjustments/Labs and Tests Ordered: Current medicines are reviewed at length with the patient today.  Concerns regarding medicines are outlined above.   Orders Placed This Encounter  Procedures   EKG 12-Lead   No orders of the defined types were placed in this encounter.  Patient Instructions  Medication Instructions:  Your physician recommends that you continue on your current medications as directed. Please refer to the Current Medication list given to you today.   Labwork: NONE  Testing/Procedures: NONE  Follow-Up: 03/28/2023 4:20 PM WITH DR Harrell Gave      I,Mathew Stumpf,acting as a scribe for Buford Dresser, MD.,have documented all relevant documentation on the behalf of Buford Dresser, MD,as directed by  Buford Dresser, MD while in the presence of Buford Dresser, MD.  I, Buford Dresser, MD, have reviewed all documentation for this visit. The documentation on 11/17/22 for the exam, diagnosis, procedures, and orders are all accurate and complete.   Signed, Buford Dresser, MD PhD 10/03/2022  [place]

## 2022-10-15 DIAGNOSIS — R569 Unspecified convulsions: Secondary | ICD-10-CM | POA: Diagnosis not present

## 2022-10-15 DIAGNOSIS — E782 Mixed hyperlipidemia: Secondary | ICD-10-CM | POA: Diagnosis not present

## 2022-10-15 DIAGNOSIS — I10 Essential (primary) hypertension: Secondary | ICD-10-CM | POA: Diagnosis not present

## 2022-10-15 DIAGNOSIS — N182 Chronic kidney disease, stage 2 (mild): Secondary | ICD-10-CM | POA: Diagnosis not present

## 2022-10-15 DIAGNOSIS — R7303 Prediabetes: Secondary | ICD-10-CM | POA: Diagnosis not present

## 2022-10-22 DIAGNOSIS — I639 Cerebral infarction, unspecified: Secondary | ICD-10-CM | POA: Diagnosis not present

## 2022-10-22 DIAGNOSIS — R7303 Prediabetes: Secondary | ICD-10-CM | POA: Diagnosis not present

## 2022-10-22 DIAGNOSIS — I447 Left bundle-branch block, unspecified: Secondary | ICD-10-CM | POA: Diagnosis not present

## 2022-10-22 DIAGNOSIS — D473 Essential (hemorrhagic) thrombocythemia: Secondary | ICD-10-CM | POA: Diagnosis not present

## 2022-10-22 DIAGNOSIS — I4719 Other supraventricular tachycardia: Secondary | ICD-10-CM | POA: Diagnosis not present

## 2022-10-22 DIAGNOSIS — E782 Mixed hyperlipidemia: Secondary | ICD-10-CM | POA: Diagnosis not present

## 2022-10-22 DIAGNOSIS — I10 Essential (primary) hypertension: Secondary | ICD-10-CM | POA: Diagnosis not present

## 2022-10-22 DIAGNOSIS — N182 Chronic kidney disease, stage 2 (mild): Secondary | ICD-10-CM | POA: Diagnosis not present

## 2022-10-22 DIAGNOSIS — R569 Unspecified convulsions: Secondary | ICD-10-CM | POA: Diagnosis not present

## 2022-11-17 ENCOUNTER — Encounter (HOSPITAL_BASED_OUTPATIENT_CLINIC_OR_DEPARTMENT_OTHER): Payer: Self-pay | Admitting: Cardiology

## 2022-11-23 ENCOUNTER — Encounter (HOSPITAL_COMMUNITY): Payer: Self-pay | Admitting: Emergency Medicine

## 2022-11-23 ENCOUNTER — Ambulatory Visit (HOSPITAL_COMMUNITY)
Admission: EM | Admit: 2022-11-23 | Discharge: 2022-11-23 | Disposition: A | Discharge status: Home / Self Care | Payer: Medicare Other | Attending: Family Medicine | Admitting: Family Medicine

## 2022-11-23 ENCOUNTER — Emergency Department (HOSPITAL_COMMUNITY)
Admission: EM | Admit: 2022-11-23 | Discharge: 2022-11-23 | Disposition: A | Discharge status: Home / Self Care | Payer: Medicare Other | Attending: Emergency Medicine | Admitting: Emergency Medicine

## 2022-11-23 ENCOUNTER — Other Ambulatory Visit: Payer: Self-pay

## 2022-11-23 ENCOUNTER — Emergency Department (HOSPITAL_COMMUNITY): Payer: Medicare Other

## 2022-11-23 DIAGNOSIS — R079 Chest pain, unspecified: Secondary | ICD-10-CM | POA: Diagnosis not present

## 2022-11-23 DIAGNOSIS — Z79899 Other long term (current) drug therapy: Secondary | ICD-10-CM | POA: Insufficient documentation

## 2022-11-23 DIAGNOSIS — Z7982 Long term (current) use of aspirin: Secondary | ICD-10-CM | POA: Diagnosis not present

## 2022-11-23 DIAGNOSIS — Z1152 Encounter for screening for COVID-19: Secondary | ICD-10-CM | POA: Insufficient documentation

## 2022-11-23 DIAGNOSIS — K2100 Gastro-esophageal reflux disease with esophagitis, without bleeding: Secondary | ICD-10-CM | POA: Insufficient documentation

## 2022-11-23 DIAGNOSIS — D75839 Thrombocytosis, unspecified: Secondary | ICD-10-CM | POA: Diagnosis not present

## 2022-11-23 DIAGNOSIS — R0789 Other chest pain: Secondary | ICD-10-CM | POA: Diagnosis not present

## 2022-11-23 DIAGNOSIS — I10 Essential (primary) hypertension: Secondary | ICD-10-CM | POA: Insufficient documentation

## 2022-11-23 DIAGNOSIS — R531 Weakness: Secondary | ICD-10-CM | POA: Insufficient documentation

## 2022-11-23 DIAGNOSIS — K219 Gastro-esophageal reflux disease without esophagitis: Secondary | ICD-10-CM | POA: Diagnosis not present

## 2022-11-23 DIAGNOSIS — I700 Atherosclerosis of aorta: Secondary | ICD-10-CM | POA: Diagnosis not present

## 2022-11-23 LAB — RESP PANEL BY RT-PCR (RSV, FLU A&B, COVID)  RVPGX2
Influenza A by PCR: NEGATIVE
Influenza B by PCR: NEGATIVE
Resp Syncytial Virus by PCR: NEGATIVE
SARS Coronavirus 2 by RT PCR: NEGATIVE

## 2022-11-23 LAB — CBC
HCT: 38.7 % (ref 36.0–46.0)
Hemoglobin: 12.8 g/dL (ref 12.0–15.0)
MCH: 31.4 pg (ref 26.0–34.0)
MCHC: 33.1 g/dL (ref 30.0–36.0)
MCV: 95.1 fL (ref 80.0–100.0)
Platelets: 537 10*3/uL — ABNORMAL HIGH (ref 150–400)
RBC: 4.07 MIL/uL (ref 3.87–5.11)
RDW: 18.5 % — ABNORMAL HIGH (ref 11.5–15.5)
WBC: 7.4 10*3/uL (ref 4.0–10.5)
nRBC: 0 % (ref 0.0–0.2)

## 2022-11-23 LAB — BASIC METABOLIC PANEL
Anion gap: 9 (ref 5–15)
BUN: 8 mg/dL (ref 8–23)
CO2: 26 mmol/L (ref 22–32)
Calcium: 8.8 mg/dL — ABNORMAL LOW (ref 8.9–10.3)
Chloride: 101 mmol/L (ref 98–111)
Creatinine, Ser: 0.81 mg/dL (ref 0.44–1.00)
GFR, Estimated: >60 mL/min (ref >60)
Glucose, Bld: 111 mg/dL — ABNORMAL HIGH (ref 70–99)
Potassium: 3.7 mmol/L (ref 3.5–5.1)
Sodium: 136 mmol/L (ref 135–145)

## 2022-11-23 LAB — TROPONIN I (HIGH SENSITIVITY): Troponin I (High Sensitivity): 17 ng/L (ref <18)

## 2022-11-23 NOTE — ED Provider Notes (Signed)
I was called into triage to evaluate patient by nursing staff.  Reports 24-hour history of GI symptoms including epigastric abdominal pain, diarrhea, nausea.  Reports associated lightheadedness but denies any vomiting, melena, hematochezia.  Denies any shortness of breath or diaphoresis.  Does report that she has some chest discomfort but describes this as a burning sensation consistent with previous episodes of GERD.  She does report generally feeling weak.  EKG was obtained that showed new AV block compared to 10/30/2022 tracing.  Recommended that given her symptoms she would benefit from stat labs and potentially imaging which we do not have available in urgent care.  She and family are agreeable to emergency room evaluation and will go directly to the ER for further evaluation and management.  Patient was stable at the time of discharge.   Terrilee Croak, PA-C 11/23/22 1506

## 2022-11-23 NOTE — ED Triage Notes (Signed)
Pt to ED from urgent care c/o chest pain/epigastric pain that started last night. Reports pain is burning in nature. Pt also c/o diarrhea and nausea last night.

## 2022-11-23 NOTE — Discharge Instructions (Signed)
You can use tums, maalox, mylanta over the counter as needed. You may want to discuss daily protonix or pepcid if your symptoms grow more frequent.

## 2022-11-23 NOTE — ED Provider Notes (Signed)
[facility] Provider Note   CSN: [id number] Arrival date & time: 11/23/22  1532     History  Chief Complaint  Patient presents with   Chest Pain    Danielle Vaughan is a 89 y.o. female with past medical history significant for previous occipital stroke, partial seizures, hypertension, acid reflux who presents with concern for epigastric burning pain that began last night, and persisted until around 2 PM today.  Patient reports that it felt like similar episodes of heartburn, worse with sitting or laying down, denies any exertional component.  She denies any shortness of breath.  She reports that she had some mild nausea last night but not associated with the chest pain.  She reports that she has had 2 episodes of diarrhea without hematemesis since last night.  She reports some generalized weakness.  At time my evaluation patient reports that she overall feels fine, denies any active chest pain, stomach pain, nausea, vomiting.  She denies any headache, vision changes, numbness, tingling, weakness.  She denies any dysuria, hematuria.   Chest Pain      Home Medications Prior to Admission medications   Medication Sig Start Date End Date Taking? Authorizing Provider  amLODipine (NORVASC) 5 MG tablet Take 5 mg by mouth at bedtime.     [provider]  aspirin EC 81 MG tablet Take 81 mg by mouth daily. Swallow whole.    [provider]  atorvastatin (LIPITOR) 10 MG tablet Take 10 mg by mouth daily. Patient not taking: Reported on 10/03/2022 11/11/19   [provider]  benazepril (LOTENSIN) 40 MG tablet Take 40 mg by mouth at bedtime.     [provider]  metoprolol succinate (TOPROL-XL) 100 MG 24 hr tablet Take 100 mg by mouth daily. Take with or immediately following a meal.    [provider]  Vitamin D, Ergocalciferol, 50000 units CAPS 1 capsule Orally for 30 day(s) 01/31/22   [provider]       Allergies    Cefoxitin, Ceftin [cefuroxime axetil], and Codeine    Review of Systems   Review of Systems  Cardiovascular:  Positive for chest pain.  All other systems reviewed and are negative.   Physical Exam Updated Vital Signs BP (!) 143/87   Pulse 66   Temp 98.2 F (36.8 C) (Oral)   Resp 17   Ht '5\' 1"'$  (1.549 m)   Wt 57.6 kg   SpO2 96%   BMI 24.00 kg/m  Physical Exam Vitals and nursing note reviewed.  Constitutional:      General: She is not in acute distress.    Appearance: Normal appearance.  HENT:     Head: Normocephalic and atraumatic.  Eyes:     General:        Right eye: No discharge.        Left eye: No discharge.  Cardiovascular:     Rate and Rhythm: Normal rate and regular rhythm.     Heart sounds: No murmur heard.    No friction rub. No gallop.     Comments: No ttp of chest wall Pulmonary:     Effort: Pulmonary effort is normal.     Breath sounds: Normal breath sounds.  Abdominal:     General: Bowel sounds are normal.     Palpations: Abdomen is soft.     Comments: No significant ttp throughout  Skin:    General: Skin is warm and dry.  Capillary Refill: Capillary refill takes less than 2 seconds.  Neurological:     Mental Status: She is alert and oriented to person, place, and time.  Psychiatric:        Mood and Affect: Mood normal.        Behavior: Behavior normal.     ED Results / Procedures / Treatments   Labs (all labs ordered are listed, but only abnormal results are displayed) Labs Reviewed  BASIC METABOLIC PANEL - Abnormal; Notable for the following components:      Result Value   Glucose, Bld 111 (*)    Calcium 8.8 (*)    All other components within normal limits  CBC - Abnormal; Notable for the following components:   RDW 18.5 (*)    Platelets 537 (*)    All other components within normal limits  RESP PANEL BY RT-PCR (RSV, FLU A&B, COVID)  RVPGX2  TROPONIN I (HIGH SENSITIVITY)    EKG None  Radiology DG Chest 2  View  Result Date: 11/23/2022 CLINICAL DATA:  Chest pain. EXAM: CHEST - 2 VIEW COMPARISON:  None Available. FINDINGS: The cardiac silhouette is mildly enlarged. There is mild calcification of the aortic arch and tortuosity of the descending thoracic aorta. Both lungs are clear. Multilevel degenerative changes seen throughout the thoracic spine. IMPRESSION: No active cardiopulmonary disease. Electronically Signed   By: Virgina Norfolk M.D.   On: 11/23/2022 16:29    Procedures Procedures    Medications Ordered in ED Medications - No data to display  ED Course/ Medical Decision Making/ A&P                             Medical Decision Making Amount and/or Complexity of Data Reviewed Labs: ordered. Radiology: ordered.   This patient is a 89 y.o. female  who presents to the ED for concern of epigastric to lower chest pain that feels like burning since this morning lasting for around 2, she endorses additionally to mild nausea last night resolved today, she reports she has had 2 episodes of diarrhea since last night..   Differential diagnoses prior to evaluation: The emergent differential diagnosis includes, but is not limited to,  ACS, AAS, PE, Mallory-Weiss, Boerhaave's, Pneumonia, acute bronchitis, asthma or COPD exacerbation, anxiety, MSK pain or traumatic injury to the chest, acid reflux versus other  . This is not an exhaustive differential.   Past Medical History / Co-morbidities: Hypertension, previous stroke  Additional history: Chart reviewed. Pertinent results include: Reviewed remote CV procedures, she had an echo in 2021 with normal ejection fraction, some bilateral atrial dilatation, no previous evidence of coronary artery disease  Physical Exam: Physical exam performed. The pertinent findings include: No tenderness to palpation of epigastric region, or chest wall.  She is mildly hypertensive in the emergency department, with blood pressure 168/98 on arrival, without  management improved to 143/87.  She continues to have no active chest pain on my exam.  Lab Tests/Imaging studies: I personally interpreted labs/imaging and the pertinent results include:  CBC unremarkable, she has mild thrombocytosis, no leukocytosis or clinically significant anemia.  She has unremarkable RVP, troponin is normal x 1 in context of no active chest pain, nonexertional or non-ACS sounding chest pain on exam.  Her BMP is overall unremarkable, she has mild hyperglycemia, glucose 111..  I independently interpreted plain film chest x-ray which shows no evidence of acute thoracic abnormality. I agree with the radiologist interpretation.  Cardiac  monitoring: EKG obtained and interpreted by my attending physician which shows: Left bundle branch block, no other changes compared to previous.  No new evidence of ischemia.   Medications: Encouraged Tums, Maalox, Mylanta on discharge, she is having frequent episodes of acid reflux encouraged daily Protonix or Pepcid, discussed taking talk about this with her primary care doctor.   Disposition: After consideration of the diagnostic results and the patients response to treatment, I feel that patient with noncardiac sounding presentation of epigastric pain, chest pain which is resolved at time of her presentation.  She reports that she feels well overall.  She endorsed 2 episodes of diarrhea.  Overall discussed that she may have some mild viral presentation versus mild gastroenteritis, enteritis, overall low clinical suspicion for ACS, AAS, or pneumonia, or other intrathoracic abnormality especially with no symptoms on evaluation, no exertional symptoms on history, other than age, hypertension, previous stroke, she is otherwise fairly low risk for acute ACS, she has no previous diagnosis of CAD.  Her symptoms seem consistent with acid reflux, possible early gastroenteritis, enteritis, but overall she is well-appearing, I think she is stable for discharge  at this time with symptomatic care.   emergency department workup does not suggest an emergent condition requiring admission or immediate intervention beyond what has been performed at this time. The plan is: as above. The patient is safe for discharge and has been instructed to return immediately for worsening symptoms, change in symptoms or any other concerns.  Final Clinical Impression(s) / ED Diagnoses Final diagnoses:  Gastroesophageal reflux disease, unspecified whether esophagitis present    Rx / DC Orders ED Discharge Orders     None         Dorien Chihuahua 11/23/22 1748    Blanchie Dessert, MD 11/24/22 1544

## 2022-11-23 NOTE — ED Notes (Signed)
Patient is being discharged from the Urgent Care and sent to the Emergency Department via POV . Per Verna Czech, PA, patient is in need of higher level of care due to limited resources. Patient is aware and verbalizes understanding of plan of care.  Vitals:   11/23/22 1434 11/23/22 1454  BP: (!) 129/90 (!) 147/82  Pulse: 68   Resp: 20   Temp: 97.8 F (36.6 C)   SpO2: 99%

## 2022-11-23 NOTE — ED Notes (Signed)
Spoke to [employer], [place] about patient, complaint and provided EKG.  Erin, Dickens went to in take to speak to patient and daughter.  Patient was sent to ED py provider.

## 2022-11-23 NOTE — ED Triage Notes (Signed)
Epigastric pain started late last night/early this morning.  Described as burning pain. Describes liquid diarrhea , 3 episodes last night.  One diarrhea stool this morning after sleeping.  Reports some nausea, head feels "something different"  not dizzy, but lightheaded.    Red area to anterior thigh, reported as 3 little bites , no itching   No sob.  Was able to sleep after diarrhea calmed down

## 2022-11-28 ENCOUNTER — Telehealth: Payer: Self-pay | Admitting: *Deleted

## 2022-11-28 NOTE — Telephone Encounter (Signed)
     Patient  visit on 11/23/2022  at [facility]  was for treatment   Have you been able to follow up with your primary care physician? Not yet will see   The patient was able to obtain any needed medicine or equipment.  Are there diet recommendations that you are having difficulty following?  Patient expresses understanding of discharge instructions and education provided has no other needs at this time.    [place] [phone] [address] , [place] [address] Email : [email or web address]

## 2022-12-10 ENCOUNTER — Ambulatory Visit (INDEPENDENT_AMBULATORY_CARE_PROVIDER_SITE_OTHER): Payer: Medicare Other | Admitting: Neurology

## 2022-12-10 ENCOUNTER — Encounter: Payer: Self-pay | Admitting: Neurology

## 2022-12-10 VITALS — BP 129/70 | HR 52 | Ht 62.0 in | Wt 125.0 lb

## 2022-12-10 DIAGNOSIS — R569 Unspecified convulsions: Secondary | ICD-10-CM | POA: Diagnosis not present

## 2022-12-10 DIAGNOSIS — R413 Other amnesia: Secondary | ICD-10-CM | POA: Diagnosis not present

## 2022-12-10 DIAGNOSIS — Z8673 Personal history of transient ischemic attack (TIA), and cerebral infarction without residual deficits: Secondary | ICD-10-CM

## 2022-12-10 NOTE — Patient Instructions (Addendum)
Order ambulatory EEG Remain on aspirin 81 mg daily Keep BP < 130/90, LDL < 70, A1C < 7.0 See you back in 6 months

## 2022-12-10 NOTE — Progress Notes (Signed)
Patient: Danielle Vaughan Date of Birth: July 31, 1933  Reason for Visit: Follow up History from: Patient, daughter Primary Neurologist: Dr. [name]   ASSESSMENT AND PLAN 89 y.o. year old female   1.  Recurrent spells of blurry vision, right hand weakness -Has not been able to tolerate Keppra or Zonegran -EEG showed mild background slowing -Order 72-hour ambulatory EEG -Hypoglycemia continues to be in the differential, asked her daughter to check her blood sugar when the spells occur, ensure eating frequent small meals to prevent blood sugar drops  2.  Dementia -MOCA took extended period of time, couldn't be completed, was 15/30 in March  -Is quite sharp  3.  History of right occipital stroke -On aspirin 81 mg daily -Keep BP < 130/90, LDL < 70, A1C < 7.0  4.  Cardiac arrhythmia -Sees cardiology, cardiac monitor showed nonsustained V. tach, paroxysmal SVT; on metoprolol  HISTORY  Danielle Vaughan is a 89 year old female, seen in request by her primary care physician [name], [name] for evaluation of abnormal CT scan, she is accompanied by her daughter Danielle Vaughan at today's visit on November 15, 2019,   I have reviewed and summarized the referring note from the referring physician.  She had past medical history of hypertension, hyperlipidemia,   Since September 2020, she reported recurrent similar episode of blurry vision, right hand weakness following her evening meal on Sunday.  It happens intermittently,   She had a gradual decreased appetite over the years, but still active, able to have help with household chores, she tends to have smaller meal on Sunday, by evening time, when she finished her evening meal, she will often get blurry vision, right hand weakness, to the point of difficulty picking up her fork, symptoms last about 15 minutes, she denied loss of consciousness, denied increased gait abnormality, she has significant right knee pain is a candidate for right knee  replacement, ambulate with a cane   For that reason, she was referred for CAT scan, I have personally reviewed CT head without contrast on November 11, 2019, generalized atrophy, supratentorium small vessel disease, subacute right parieto-occipital PCAs infarction   She was started on aspirin 81 mg daily, and Lipitor 10 mg every night since February 11   Update December 08, 2019: At previous visit, she complains of spells only at Sunday, but on further questioning, she reported intermittent spells couple times each week, sudden onset mild confusion, slow reaction, funny feelings, with some visual distortion, sometimes right arm numbness, heaviness.  She describes 1 episode on November 23, 2019, after visiting her son at nursing home, she was sitting in the car with the bright sunshine, she suddenly felt funny, mild visual distortion, right arm heaviness, lasting 10 to 15 minutes, she denied loss of consciousness.  Her daughter also described, when she was having a spells, she often was noticed by days into space, but when she interact with patient, she was a bit slow to react, but able to answer questions   She was seen by cardiologist, had 2 weeks cardiac monitoring, visit and reports are pending   I personally reviewed MRI of the brain without contrast on November 19, 2019 from [facility], subacute infarction at the right occipital lobe, moderate supratentorium small vessel disease   US carotid artery showed less than 39% stenosis bilaterally Echocardiogram showed normal left ventricular function, moderate asymmetric left ventricular hypertrophy of the basal septal segment, right ventricle systolic click functions normal, left atrial size with severely dilated  UPDATE Oct 30 2020 Dr. [name]: She is accompanied by her daughter going to today's clinical visit, she has not been compliant with her Keppra xr 500 mg every night, daughter reported that with Keppra, she is doing better, had much less spells  of sudden onset blurry vision, right hand weakness, confusion, at least 75% improvement, We again reviewed MRI report, right occipital stroke, moderate supratentorium small vessel disease EEG October 2021 showed no significant abnormality   UPDATE November 29 2021: She is accompanied by her daughter at today's clinical visit, she lives with her daughter and son, still trying to be active at home,  She continue complains of intermittent dizziness, overwhelming sensation episode often associated with her meals, she was put on Keppra 500 mg twice daily in 2021 for similar complaints, suspicious for partial seizure, she reported significant improvement at that time  In addition, MRI of the brain in 2021 showed subacute infarction of the right occipital lobe, moderate supratentorium small vessel disease  Then she began to noticed side effect with Keppra, very weak complaints, seems to be dizziness, lightheaded sensation, she stopped the Keppra, was switched from Zonegran 50 mg every night since December 2022, she still complains of not feeling well, contributed to [place]  She is still able to do some house chores, but increased memory loss, today's MoCA examination 15/30,   She continued complaints recurrent spells of not feeling well, blurry vision, couple times each month, lasting for 10 minutes, both patient and her daughter are very resistant taking antiepileptic medications, will take of Zonegran now,  Update June 06, 2022 SS: In the ER 04/28/22 for dizziness, CT head showed no acute stroke. Last night spell of lightheadedness, she laid down for 20 minutes, spells often associated with blurry vision, right hand weakness. She hadn't eaten in awhile when this happened. Sometimes glucose could be low in the 80's. Most spells associated with long periods of time between eating.   Labs at last visit 11/29/21 A1c 6.0, TSH 0.313, RPR negative, B12 265, CRP 11, sed rate 46, ANA negative  Update December 10, 2022 SS: We tried memory testing, but took extended amount of time, she is sharp, turning 89 next week. Continues with spells when finishes eats, vision is not as bright, not necessarily blurry. Right arm feels weak. Happens few times a week. Getting around with walker due to needing knee replacement. Should be on aspirin 81 mg. BP 129/70, On Lipitor. PCP follows cholesterol. Watches her diet. Does her own ADLs  REVIEW OF SYSTEMS: Out of a complete 14 system review of symptoms, the patient complains only of the following symptoms, and all other reviewed systems are negative.  See HPI  ALLERGIES: Allergies  Allergen Reactions   Cefoxitin    Ceftin [Cefuroxime Axetil] Other (See Comments)    Seeing lights   Codeine Other (See Comments)    Unknown    HOME MEDICATIONS: Outpatient Medications Prior to Visit  Medication Sig Dispense Refill   amLODipine (NORVASC) 5 MG tablet Take 5 mg by mouth at bedtime.      aspirin EC 81 MG tablet Take 81 mg by mouth daily. Swallow whole.     benazepril (LOTENSIN) 40 MG tablet Take 40 mg by mouth at bedtime.      Cholecalciferol (VITAMIN D3 PO) Take 1 capsule by mouth daily.     metoprolol succinate (TOPROL-XL) 100 MG 24 hr tablet Take 100 mg by mouth daily. Take with or immediately following a meal.  atorvastatin (LIPITOR) 10 MG tablet Take 10 mg by mouth daily. (Patient not taking: Reported on 10/03/2022)     Vitamin D, Ergocalciferol, 50000 units CAPS 1 capsule Orally for 30 day(s)     No facility-administered medications prior to visit.    PAST MEDICAL HISTORY: Past Medical History:  Diagnosis Date   Hypertension    Stroke [facility])     PAST SURGICAL HISTORY: Past Surgical History:  Procedure Laterality Date   PARTIAL HYSTERECTOMY      FAMILY HISTORY: Family History  Adopted: Yes  Problem Relation Age of Onset   Other Mother        unsure of history   Other Father        unsure of history    SOCIAL HISTORY: Social History    Socioeconomic History   Marital status: Widowed    Spouse name: Not on file   Number of children: 6   Years of education: 10th grade   Highest education level: Not on file  Occupational History   Occupation: Retired  Tobacco Use   Smoking status: Never   Smokeless tobacco: Never  Vaping Use   Vaping Use: Never used  Substance and Sexual Activity   Alcohol use: No   Drug use: Never   Sexual activity: Never  Other Topics Concern   Not on file  Social History Narrative   Right-handed.   No daily use of caffeine.   She lives at home with three of her children (she has four living).   Social Determinants of Health   Financial Resource Strain: Not on file  Food Insecurity: Not on file  Transportation Needs: Not on file  Physical Activity: Not on file  Stress: Not on file  Social Connections: Not on file  Intimate Partner Violence: Not on file   PHYSICAL EXAM  Vitals:   12/10/22 1523  BP: 129/70  Pulse: (!) 52  Weight: 125 lb (56.7 kg)  Height: '5\' 2"'$  (1.575 m)    Body mass index is 22.86 kg/m.  Generalized: Well developed, in no acute distress  Neurological examination  Mentation: Alert oriented to time, place, history is provided by patient and her daughter, both are very pleasant. Follows all commands speech and language fluent Cranial nerve II-XII: Pupils were equal round reactive to light. Extraocular movements were full, visual field were full on confrontational test. Facial sensation and strength were normal. Head turning and shoulder shrug  were normal and symmetric. Motor: The motor testing reveals 5 over 5 strength of all 4 extremities. Good symmetric motor tone is noted throughout.  Sensory: Sensory testing is intact to soft touch on all 4 extremities. No evidence of extinction is noted.  Coordination: Cerebellar testing reveals good finger-nose-finger and heel-to-shin bilaterally.  Gait and station: Has to push off from seated position, gait is wide  based, cautious, uses walker  Reflexes: Deep tendon reflexes are symmetric and normal bilaterally.   DIAGNOSTIC DATA (LABS, IMAGING, TESTING) - I reviewed patient records, labs, notes, testing and imaging myself where available.  Lab Results  Component Value Date   WBC 7.4 11/23/2022   HGB 12.8 11/23/2022   HCT 38.7 11/23/2022   MCV 95.1 11/23/2022   PLT 537 (H) 11/23/2022      Component Value Date/Time   NA 136 11/23/2022 1553   K 3.7 11/23/2022 1553   CL 101 11/23/2022 1553   CO2 26 11/23/2022 1553   GLUCOSE 111 (H) 11/23/2022 1553   BUN 8 11/23/2022 1553  CREATININE 0.81 11/23/2022 1553   CALCIUM 8.8 (L) 11/23/2022 1553   GFRNONAA >60 11/23/2022 1553   GFRAA >60 06/07/2017 1422   No results found for: "CHOL", "HDL", "LDLCALC", "LDLDIRECT", "TRIG", "CHOLHDL" Lab Results  Component Value Date   HGBA1C 6.0 (H) 11/29/2021   Lab Results  Component Value Date   VITAMINB12 265 11/29/2021   Lab Results  Component Value Date   TSH 0.313 (L) 11/29/2021    Butler Denmark, AGNP-C, DNP 12/10/2022, 4:24 PM [facility] [address], [place], [place] [address] [phone]

## 2022-12-16 ENCOUNTER — Telehealth: Payer: Self-pay | Admitting: Neurology

## 2022-12-16 NOTE — Telephone Encounter (Signed)
Pt's daughter is asking for a call re: when pt will be scheduled for Ambulatory EEG

## 2022-12-17 NOTE — Telephone Encounter (Signed)
New order form faxed to [facility]

## 2022-12-19 NOTE — Telephone Encounter (Signed)
Received fax that patient is scheduled for 4.2-4.5.24

## 2022-12-23 DIAGNOSIS — M170 Bilateral primary osteoarthritis of knee: Secondary | ICD-10-CM | POA: Diagnosis not present

## 2022-12-31 DIAGNOSIS — R569 Unspecified convulsions: Secondary | ICD-10-CM | POA: Diagnosis not present

## 2022-12-31 DIAGNOSIS — R413 Other amnesia: Secondary | ICD-10-CM | POA: Diagnosis not present

## 2023-01-01 DIAGNOSIS — R569 Unspecified convulsions: Secondary | ICD-10-CM | POA: Diagnosis not present

## 2023-01-01 DIAGNOSIS — R413 Other amnesia: Secondary | ICD-10-CM | POA: Diagnosis not present

## 2023-01-02 DIAGNOSIS — R413 Other amnesia: Secondary | ICD-10-CM | POA: Diagnosis not present

## 2023-01-02 DIAGNOSIS — R569 Unspecified convulsions: Secondary | ICD-10-CM | POA: Diagnosis not present

## 2023-01-03 DIAGNOSIS — R413 Other amnesia: Secondary | ICD-10-CM | POA: Diagnosis not present

## 2023-01-03 DIAGNOSIS — R569 Unspecified convulsions: Secondary | ICD-10-CM | POA: Diagnosis not present

## 2023-01-06 DIAGNOSIS — R413 Other amnesia: Secondary | ICD-10-CM | POA: Diagnosis not present

## 2023-01-06 DIAGNOSIS — R569 Unspecified convulsions: Secondary | ICD-10-CM | POA: Diagnosis not present

## 2023-01-20 ENCOUNTER — Encounter (HOSPITAL_COMMUNITY): Payer: Self-pay

## 2023-01-20 ENCOUNTER — Ambulatory Visit (HOSPITAL_COMMUNITY)
Admission: EM | Admit: 2023-01-20 | Discharge: 2023-01-20 | Disposition: A | Discharge status: Home / Self Care | Payer: Medicare Other

## 2023-01-20 ENCOUNTER — Telehealth: Payer: Self-pay | Admitting: Neurology

## 2023-01-20 DIAGNOSIS — R42 Dizziness and giddiness: Secondary | ICD-10-CM | POA: Diagnosis not present

## 2023-01-20 NOTE — Discharge Instructions (Addendum)
Your dizziness is likely due to dehydration.  Your neurologic exam looks great.  Continue drinking plenty of water.  If you develop any new or worsening symptoms in the next 2 to 3 days, please return to urgent care.  If your symptoms are severe, please go to the nearest emergency room.  I hope you feel better!

## 2023-01-20 NOTE — ED Provider Notes (Signed)
[facility]    CSN: [id number] Arrival date & time: 01/20/23  1717      History   Chief Complaint Chief Complaint  Patient presents with   Dizziness    HPI Danielle Vaughan is a 89 y.o. female.   Patient with history of CVA and partial seizures presents to urgent care for evaluation of episodic and momentary dizziness that happened this morning while she was turning over on her pillow in the bed. Episode lasted "a few moments" then resolved without intervention. She was able to sit up, stand up, and get dressed without dizziness. She has not experienced similar dizziness since episode this morning while in the bed and denies current dizziness. No nausea, vomiting, abdominal pain, recent falls, urinary smptoms, abnormal uterine/bowel/urinary bleeding, or associated headache. No recent febrile illnesses, viral URI symptoms, unilateral weakness, paresthesias, or fatigue. No leg swelling, orthopnea, shortness of breath, chest pain, or heart palpitations. She states she has allergies this time of year and thinks that maybe the "hay fever" caused dizziness. Also thought BP may be high so she took her BP medication. BP currently 122/75. No recent antibiotic or steroid use. No recent falls or LOC. States this does not feel like partial seizure.    Dizziness   Past Medical History:  Diagnosis Date   Hypertension    Stroke     Patient Active Problem List   Diagnosis Date Noted   Partial seizures 11/29/2021   Abnormal glucose 11/29/2021   Abnormal findings on diagnostic imaging of other specified body structures 11/29/2021   Memory loss 11/29/2021   NSVT (nonsustained ventricular tachycardia) 06/14/2020   Paroxysmal SVT (supraventricular tachycardia) 06/14/2020   Frequent PVCs 06/14/2020   History of CVA (cerebrovascular accident) 01/27/2020   Occipital stroke 12/08/2019   Visual changes 12/08/2019   Right hand weakness 11/15/2019   Abnormal CT of the head 11/15/2019     Past Surgical History:  Procedure Laterality Date   PARTIAL HYSTERECTOMY      OB History   No obstetric history on file.      Home Medications    Prior to Admission medications   Medication Sig Start Date End Date Taking? Authorizing Provider  amLODipine (NORVASC) 5 MG tablet Take 5 mg by mouth at bedtime.     [provider]  aspirin EC 81 MG tablet Take 81 mg by mouth daily. Swallow whole.    [provider]  atorvastatin (LIPITOR) 10 MG tablet Take 10 mg by mouth daily. Patient not taking: Reported on 10/03/2022 11/11/19   [provider]  benazepril (LOTENSIN) 40 MG tablet Take 40 mg by mouth at bedtime.     [provider]  Cholecalciferol (VITAMIN D3 PO) Take 1 capsule by mouth daily.    [provider]  metoprolol succinate (TOPROL-XL) 100 MG 24 hr tablet Take 100 mg by mouth daily. Take with or immediately following a meal.    [provider]    Family History Family History  Adopted: Yes  Problem Relation Age of Onset   Other Mother        unsure of history   Other Father        unsure of history    Social History Social History   Tobacco Use   Smoking status: Never   Smokeless tobacco: Never  Vaping Use   Vaping Use: Never used  Substance Use Topics   Alcohol use: No   Drug use: Never     Allergies  Cefoxitin, Ceftin [cefuroxime axetil], and Codeine   Review of Systems Review of Systems  Neurological:  Positive for dizziness.  Per HPI   Physical Exam Triage Vital Signs ED Triage Vitals  Enc Vitals Group     BP 01/20/23 1850 122/75     Pulse Rate 01/20/23 1850 91     Resp 01/20/23 1850 18     Temp 01/20/23 1850 99.1 F (37.3 C)     Temp Source 01/20/23 1850 Oral     SpO2 01/20/23 1850 95 %     Weight --      Height --      Head Circumference --      Peak Flow --      Pain Score 01/20/23 1853 0     Pain Loc --      Pain Edu? --      Excl. in GC? --    No data  found.  Updated Vital Signs BP 122/75 (BP Location: Left Arm)   Pulse 91   Temp 99.1 F (37.3 C) (Oral)   Resp 18   SpO2 95%   Visual Acuity Right Eye Distance:   Left Eye Distance:   Bilateral Distance:    Right Eye Near:   Left Eye Near:    Bilateral Near:     Physical Exam Vitals and nursing note reviewed.  Constitutional:      Appearance: She is not ill-appearing or toxic-appearing.  HENT:     Head: Normocephalic and atraumatic.     Right Ear: Hearing, tympanic membrane, ear canal and external ear normal.     Left Ear: Hearing, tympanic membrane, ear canal and external ear normal.     Nose: Nose normal.     Mouth/Throat:     Lips: Pink.     Mouth: Mucous membranes are moist. No injury.     Tongue: No lesions. Tongue does not deviate from midline.     Palate: No mass and lesions.     Pharynx: Oropharynx is clear. Uvula midline. No pharyngeal swelling, oropharyngeal exudate, posterior oropharyngeal erythema or uvula swelling.     Tonsils: No tonsillar exudate or tonsillar abscesses.  Eyes:     General: Lids are normal. Vision grossly intact. Gaze aligned appropriately.     Extraocular Movements: Extraocular movements intact.     Conjunctiva/sclera: Conjunctivae normal.  Cardiovascular:     Rate and Rhythm: Normal rate and regular rhythm.     Heart sounds: Normal heart sounds, S1 normal and S2 normal.  Pulmonary:     Effort: Pulmonary effort is normal. No respiratory distress.     Breath sounds: Normal breath sounds and air entry.  Abdominal:     General: Bowel sounds are normal.     Palpations: Abdomen is soft.     Tenderness: There is no abdominal tenderness. There is no right CVA tenderness, left CVA tenderness or guarding.  Musculoskeletal:     Cervical back: Neck supple.     Right lower leg: No edema.     Left lower leg: No edema.  Skin:    General: Skin is warm and dry.     Capillary Refill: Capillary refill takes less than 2 seconds.     Findings: No  rash.  Neurological:     General: No focal deficit present.     Mental Status: She is alert and oriented to person, place, and time. Mental status is at baseline.     Cranial Nerves: Cranial nerves 2-12 are intact.  No dysarthria or facial asymmetry.     Sensory: Sensation is intact.     Motor: Motor function is intact.     Coordination: Coordination is intact.     Gait: Gait is intact.     Comments: Strength and sensation intact to bilateral upper and lower extremities (5/5). Moves all 4 extremities with normal coordination voluntarily. Non-focal neuro exam.   Psychiatric:        Mood and Affect: Mood normal.        Speech: Speech normal.        Behavior: Behavior normal.        Thought Content: Thought content normal.        Judgment: Judgment normal.      UC Treatments / Results  Labs (all labs ordered are listed, but only abnormal results are displayed) Labs Reviewed - No data to display  EKG   Radiology AMBULATORY EEG  Result Date: 01/21/2023 Windell Norfolk, MD     01/21/2023  9:24 AM Clinical History : This is a 89 y/o F/M who presents with recurrent events of blurry vision, right hand weakness and possible hyperglycemia. She has a past medical history of hypertension and hyperlipidemia. INTERMITTENT MONITORING with VIDEO TECHNICAL SUMMARY: This AVEEG was performed using equipment provided by [employer] utilizing Bluetooth ( [employer] ) amplifiers with continuous EEGT attended video collection using encrypted remote transmission via [employer] secured cellular tower network with data rates for each AVEEG performed. This is a Therapist, music AVEEG, obtained, according to the 10-20 international electrode placement system, reformatted digitally into referential and bipolar montages. Data was acquired with a minimum of 21 bipolar connections and sampled at a minimum rate of 250 cycles per second per channel, maximum rate of 450 cycles per second per channel and two channels for  EKG. The entire VEEG study was recorded through cable and or radio telemetry for subsequent analysis. Specified epochs of the AVEEG data were identified at the direction of the subject by the depression of a push button by the patient. Each patients event file included data acquired two minutes prior to the push button activation and continuing until two minutes afterwards. AVEEG files were reviewed on [employer] server, Licensed Software provided by [employer] with a digital high frequency filter set at 70 Hz and a low frequency filter set at 1 Hz with a paper speed of 70mm/s resulting in 10 seconds per digital page. This entire AVEEG was reviewed by the EEG Technologist. Random time samples, random sleep samples, clips, patient initiated push button files with included patient daily diary logs, EEG Technologist pruned data was reviewed and verified for accuracy and validity by the governing reading neurologist in full details. This AEEGV was fully compliant with all requirements for CPT 97500 for setup, patient education, take down and administered by an EEG technologist. Long-Term EEG with Video was monitored intermittently by a qualified EEG technologist for the entirety of the recording; quality check-ins were performed at a minimum of every two hours, checking and documenting real-time data and video to assure the integrity and quality of the recording (e.g., camera position, electrode integrity and impedance), and identify the need for maintenance. For intermittent monitoring, an EEG Technologist monitored no more than 12 patients concurrently. Diagnostic video was captured at least 80% of the time during the recording. PATIENT EVENTS: There were no patient events noted or captured during this recording. TECHNOLOGIST EVENTS: No clear epileptiform activity was detected by the reviewing neurodiagnostic technologist during the recording  for further evaluation. TIME SAMPLES: 10-minutes of every 2  hours recorded are reviewed as random time samples. SLEEP SAMPLES: 5-minutes of every 24 hours recorded are reviewed as random sleep samples. AWAKE: At maximal level of alertness, the posterior dominant background activity was continuous, reactive, low voltage rhythm of 8-8.5 Hz. This was symmetric, well-modulated, and attenuated with eye opening. Diffuse, symmetric, frontocentral beta range activity was present. SLEEP: N1 Sleep (Stage 1) was observed and characterized by the disappearance of alpha rhythm and the appearance of vertex activity. N2 Sleep (Stage 2) was observed and characterized by vertex waves, K-complexes, and sleep spindles. N3 (Stage 3) sleep was observed and characterized by high amplitude Delta activity of 20% EKG: There were no arrhythmias or abnormalities noted during this recording. Impression: This is a normal 66 hours ambulatory video EEG. There were no epileptiform discharges seen. There were no seizures or events captured. Normal EEG, however does not rule out epilepsy. Windell Norfolk, MD [facility]    Procedures Procedures (including critical care time)  Medications Ordered in UC Medications - No data to display  Initial Impression / Assessment and Plan / UC Course  I have reviewed the triage vital signs and the nursing notes.  Pertinent labs & imaging results that were available during my care of the patient were reviewed by me and considered in my medical decision making (see chart for details).   1. Dizziness Neurologic exam without focal deficit and intact to baseline. Patient appears well hydrated with hemodynamically stable vital signs. Given quick resolution of symptoms and symptom associated with quick position change, low suspicion for AKI, MI, CVA, UTI, and BPV. Although she does not appear to be clinically dehydrated, encouraged patient to push fluids to stay well hydrated and avoid rushing during position changes. PCP follow-up encouraged in  1-2 weeks for re-evaluation as needed. No indication for referral to ED based on current presentation in clinic, however strict ER and urgent care return precautions discussed. Patient and her son at the bedside are agreeable with plan.   Discussed physical exam and available lab work findings in clinic with patient.  Counseled patient regarding appropriate use of medications and potential side effects for all medications recommended or prescribed today. Discussed red flag signs and symptoms of worsening condition,when to call the PCP office, return to urgent care, and when to seek higher level of care in the emergency department. Patient verbalizes understanding and agreement with plan. All questions answered. Patient discharged in stable condition.   Final Clinical Impressions(s) / UC Diagnoses   Final diagnoses:  Dizziness     Discharge Instructions      Your dizziness is likely due to dehydration.  Your neurologic exam looks great.  Continue drinking plenty of water.  If you develop any new or worsening symptoms in the next 2 to 3 days, please return to urgent care.  If your symptoms are severe, please go to the nearest emergency room.  I hope you feel better!     ED Prescriptions   None    PDMP not reviewed this encounter.   Carlisle Beers, [place] 01/22/23 1851

## 2023-01-20 NOTE — ED Triage Notes (Signed)
Patient states she was lying on her right side this AM and began having "dizziness for a short period of time."Patient denies dizziness at this time.  Patient reports a history of vertigo "a long time ago."

## 2023-01-20 NOTE — Telephone Encounter (Signed)
Pt's daughter is asking for a call with results to EEG

## 2023-01-21 ENCOUNTER — Encounter: Payer: Self-pay | Admitting: Neurology

## 2023-01-21 DIAGNOSIS — R569 Unspecified convulsions: Secondary | ICD-10-CM

## 2023-01-21 NOTE — Telephone Encounter (Signed)
Please let her know EEG was normal.  There were no patient events noted or captured. Thanks  Impression: This is a normal 66 hours ambulatory video EEG. There were no epileptiform discharges seen. There were no seizures or events captured. Normal EEG, however does not rule out epilepsy.

## 2023-01-21 NOTE — Telephone Encounter (Signed)
I left a voicemail for the patient's daughter, Rivka Barbara (as per DPR) to call back for results.

## 2023-01-21 NOTE — Telephone Encounter (Signed)
Pt daughter called back. I informed her EEG was normal for pt. (Per Education officer, community)

## 2023-01-21 NOTE — Procedures (Signed)
Clinical History : This is a 89 y/o F/M who presents with recurrent events of blurry vision, right hand weakness and possible hyperglycemia. She has a past medical history of hypertension and hyperlipidemia.  INTERMITTENT MONITORING with VIDEO TECHNICAL SUMMARY: This AVEEG was performed using equipment provided by [employer] utilizing Bluetooth ( [employer] ) amplifiers with continuous EEGT attended video collection using encrypted remote transmission via [employer] secured cellular tower network with data rates for each AVEEG performed. This is a Therapist, music AVEEG, obtained, according to the 10-20 international electrode placement system, reformatted digitally into referential and bipolar montages. Data was acquired with a minimum of 21 bipolar connections and sampled at a minimum rate of 250 cycles per second per channel, maximum rate of 450 cycles per second per channel and two channels for EKG. The entire VEEG study was recorded through cable and or radio telemetry for subsequent analysis. Specified epochs of the AVEEG data were identified at the direction of the subject by the depression of a push button by the patient. Each patients event file included data acquired two minutes prior to the push button activation and continuing until two minutes afterwards. AVEEG files were reviewed on [employer] server, Licensed Software provided by [employer] with a digital high frequency filter set at 70 Hz and a low frequency filter set at 1 Hz with a paper speed of 18mm/s resulting in 10 seconds per digital page. This entire AVEEG was reviewed by the EEG Technologist. Random time samples, random sleep samples, clips, patient initiated push button files with included patient daily diary logs, EEG Technologist pruned data was reviewed and verified for accuracy and validity by the governing reading neurologist in full details. This AEEGV was fully compliant with all requirements for CPT 97500  for setup, patient education, take down and administered by an EEG technologist. Long-Term EEG with Video was monitored intermittently by a qualified EEG technologist for the entirety of the recording; quality check-ins were performed at a minimum of every two hours, checking and documenting real-time data and video to assure the integrity and quality of the recording (e.g., camera position, electrode integrity and impedance), and identify the need for maintenance. For intermittent monitoring, an EEG Technologist monitored no more than 12 patients concurrently. Diagnostic video was captured at least 80% of the time during the recording.  PATIENT EVENTS: There were no patient events noted or captured during this recording.  TECHNOLOGIST EVENTS: No clear epileptiform activity was detected by the reviewing neurodiagnostic technologist during the recording for further evaluation.  TIME SAMPLES: 10-minutes of every 2 hours recorded are reviewed as random time samples.  SLEEP SAMPLES: 5-minutes of every 24 hours recorded are reviewed as random sleep samples.  AWAKE: At maximal level of alertness, the posterior dominant background activity was continuous, reactive, low voltage rhythm of 8-8.5 Hz. This was symmetric, well-modulated, and attenuated with eye opening. Diffuse, symmetric, frontocentral beta range activity was present.  SLEEP: N1 Sleep (Stage 1) was observed and characterized by the disappearance of alpha rhythm and the appearance of vertex activity. N2 Sleep (Stage 2) was observed and characterized by vertex waves, K-complexes, and sleep spindles. N3 (Stage 3) sleep was observed and characterized by high amplitude Delta activity of 20%  EKG: There were no arrhythmias or abnormalities noted during this recording.   Impression: This is a normal 66 hours ambulatory video EEG. There were no epileptiform discharges seen. There were no seizures or events captured. Normal EEG, however does  not rule out epilepsy.  Windell Norfolk, MD [facility]

## 2023-01-21 NOTE — Telephone Encounter (Signed)
Pt daughter called back. Requesting a call back from nurse. 

## 2023-01-22 DIAGNOSIS — M170 Bilateral primary osteoarthritis of knee: Secondary | ICD-10-CM | POA: Diagnosis not present

## 2023-01-27 DIAGNOSIS — H04123 Dry eye syndrome of bilateral lacrimal glands: Secondary | ICD-10-CM | POA: Diagnosis not present

## 2023-01-27 DIAGNOSIS — H35372 Puckering of macula, left eye: Secondary | ICD-10-CM | POA: Diagnosis not present

## 2023-01-27 DIAGNOSIS — H35363 Drusen (degenerative) of macula, bilateral: Secondary | ICD-10-CM | POA: Diagnosis not present

## 2023-01-27 DIAGNOSIS — H353132 Nonexudative age-related macular degeneration, bilateral, intermediate dry stage: Secondary | ICD-10-CM | POA: Diagnosis not present

## 2023-01-27 DIAGNOSIS — H35033 Hypertensive retinopathy, bilateral: Secondary | ICD-10-CM | POA: Diagnosis not present

## 2023-01-29 DIAGNOSIS — R410 Disorientation, unspecified: Secondary | ICD-10-CM | POA: Diagnosis not present

## 2023-01-29 DIAGNOSIS — H539 Unspecified visual disturbance: Secondary | ICD-10-CM | POA: Diagnosis not present

## 2023-01-29 DIAGNOSIS — F99 Mental disorder, not otherwise specified: Secondary | ICD-10-CM | POA: Diagnosis not present

## 2023-01-29 DIAGNOSIS — R441 Visual hallucinations: Secondary | ICD-10-CM | POA: Diagnosis not present

## 2023-01-29 DIAGNOSIS — M170 Bilateral primary osteoarthritis of knee: Secondary | ICD-10-CM | POA: Diagnosis not present

## 2023-01-29 DIAGNOSIS — R569 Unspecified convulsions: Secondary | ICD-10-CM | POA: Diagnosis not present

## 2023-01-29 DIAGNOSIS — N182 Chronic kidney disease, stage 2 (mild): Secondary | ICD-10-CM | POA: Diagnosis not present

## 2023-01-29 DIAGNOSIS — R7303 Prediabetes: Secondary | ICD-10-CM | POA: Diagnosis not present

## 2023-02-05 DIAGNOSIS — M170 Bilateral primary osteoarthritis of knee: Secondary | ICD-10-CM | POA: Diagnosis not present

## 2023-02-12 DIAGNOSIS — R441 Visual hallucinations: Secondary | ICD-10-CM | POA: Diagnosis not present

## 2023-02-12 DIAGNOSIS — N182 Chronic kidney disease, stage 2 (mild): Secondary | ICD-10-CM | POA: Diagnosis not present

## 2023-02-12 DIAGNOSIS — R569 Unspecified convulsions: Secondary | ICD-10-CM | POA: Diagnosis not present

## 2023-02-12 DIAGNOSIS — I471 Supraventricular tachycardia, unspecified: Secondary | ICD-10-CM | POA: Diagnosis not present

## 2023-02-12 DIAGNOSIS — F0390 Unspecified dementia without behavioral disturbance: Secondary | ICD-10-CM | POA: Diagnosis not present

## 2023-02-12 DIAGNOSIS — F99 Mental disorder, not otherwise specified: Secondary | ICD-10-CM | POA: Diagnosis not present

## 2023-02-12 DIAGNOSIS — R7303 Prediabetes: Secondary | ICD-10-CM | POA: Diagnosis not present

## 2023-03-06 ENCOUNTER — Ambulatory Visit (INDEPENDENT_AMBULATORY_CARE_PROVIDER_SITE_OTHER): Payer: Medicare Other | Admitting: Neurology

## 2023-03-06 ENCOUNTER — Encounter: Payer: Self-pay | Admitting: Neurology

## 2023-03-06 VITALS — BP 115/72 | HR 76 | Resp 16 | Wt 125.0 lb

## 2023-03-06 DIAGNOSIS — G309 Alzheimer's disease, unspecified: Secondary | ICD-10-CM | POA: Diagnosis not present

## 2023-03-06 DIAGNOSIS — Z8673 Personal history of transient ischemic attack (TIA), and cerebral infarction without residual deficits: Secondary | ICD-10-CM

## 2023-03-06 DIAGNOSIS — R569 Unspecified convulsions: Secondary | ICD-10-CM | POA: Diagnosis not present

## 2023-03-06 DIAGNOSIS — F0390 Unspecified dementia without behavioral disturbance: Secondary | ICD-10-CM | POA: Insufficient documentation

## 2023-03-06 DIAGNOSIS — F02B0 Dementia in other diseases classified elsewhere, moderate, without behavioral disturbance, psychotic disturbance, mood disturbance, and anxiety: Secondary | ICD-10-CM | POA: Diagnosis not present

## 2023-03-06 NOTE — Progress Notes (Signed)
Patient: Danielle Vaughan Date of Birth: [birth year]  Reason for Visit: Follow up History from: Patient, daughter Primary Neurologist: Dr. Terrace Arabia   ASSESSMENT AND PLAN 89 y.o. year old female   1.  Recurrent spells of blurry vision, right hand weakness -Has not been able to tolerate Keppra or Zonegran -EEG showed mild background slowing -Prolonged 66-hour ambulatory video EEG was normal -Hypoglycemia continues to be in the differential, asked her daughter to check her blood sugar when the spells occur, ensure eating frequent small meals to prevent blood sugar drops, discussed seeing a nutritionist, daughter wants to hold off for now.  Patient continues to sleep late, wait several hours before eating -Continue to monitor spells, follow-up with me in 1 year or sooner if needed  2.  Dementia -MOCA 15/27 December 2021, unable to complete since, due to requiring extended period of time.  She is really quite sharp, does he ADLs -We discussed memory medications, would not offer Aricept due to potential lower the seizure threshold (monitoring for #1), we may consider Namenda but worry about side effect of dizziness especially in her age -Discussed the importance of exercise, healthy eating, brain stimulating activities  3.  History of right occipital stroke -On aspirin 81 mg daily  -Keep BP < 130/90, LDL < 70, A1C < 7.0  4.  Cardiac arrhythmia -Sees cardiology, cardiac monitor showed nonsustained V. tach, paroxysmal SVT; on metoprolol  HISTORY  Drothy D Sturm is a 89 year old female, seen in request by her primary care physician Dr. Nicholos Johns, [facility] for evaluation of abnormal CT scan, she is accompanied by her daughter Rivka Barbara at today's visit on November 15, 2019,   I have reviewed and summarized the referring note from the referring physician.  She had past medical history of hypertension, hyperlipidemia,   Since September 2020, she reported recurrent similar episode of blurry vision,  right hand weakness following her evening meal on Sunday.  It happens intermittently,   She had a gradual decreased appetite over the years, but still active, able to have help with household chores, she tends to have smaller meal on Sunday, by evening time, when she finished her evening meal, she will often get blurry vision, right hand weakness, to the point of difficulty picking up her fork, symptoms last about 15 minutes, she denied loss of consciousness, denied increased gait abnormality, she has significant right knee pain is a candidate for right knee replacement, ambulate with a cane   For that reason, she was referred for CAT scan, I have personally reviewed CT head without contrast on November 11, 2019, generalized atrophy, supratentorium small vessel disease, subacute right parieto-occipital PCAs infarction   She was started on aspirin 81 mg daily, and Lipitor 10 mg every night since February 11   Update December 08, 2019: At previous visit, she complains of spells only at Sunday, but on further questioning, she reported intermittent spells couple times each week, sudden onset mild confusion, slow reaction, funny feelings, with some visual distortion, sometimes right arm numbness, heaviness.  She describes 1 episode on November 23, 2019, after visiting her son at nursing home, she was sitting in the car with the bright sunshine, she suddenly felt funny, mild visual distortion, right arm heaviness, lasting 10 to 15 minutes, she denied loss of consciousness.  Her daughter also described, when she was having a spells, she often was noticed by days into space, but when she interact with patient, she was a bit slow to react, but able to  answer questions   She was seen by cardiologist, had 2 weeks cardiac monitoring, visit and reports are pending   I personally reviewed MRI of the brain without contrast on November 19, 2019 from [facility], subacute infarction at the right occipital lobe, moderate  supratentorium small vessel disease   US carotid artery showed less than 39% stenosis bilaterally Echocardiogram showed normal left ventricular function, moderate asymmetric left ventricular hypertrophy of the basal septal segment, right ventricle systolic click functions normal, left atrial size with severely dilated   UPDATE Oct 30 2020 Dr. Terrace Arabia: She is accompanied by her daughter going to today's clinical visit, she has not been compliant with her Keppra xr 500 mg every night, daughter reported that with Keppra, she is doing better, had much less spells of sudden onset blurry vision, right hand weakness, confusion, at least 75% improvement, We again reviewed MRI report, right occipital stroke, moderate supratentorium small vessel disease EEG October 2021 showed no significant abnormality   UPDATE November 29 2021: She is accompanied by her daughter at today's clinical visit, she lives with her daughter and son, still trying to be active at home,  She continue complains of intermittent dizziness, overwhelming sensation episode often associated with her meals, she was put on Keppra 500 mg twice daily in 2021 for similar complaints, suspicious for partial seizure, she reported significant improvement at that time  In addition, MRI of the brain in 2021 showed subacute infarction of the right occipital lobe, moderate supratentorium small vessel disease  Then she began to noticed side effect with Keppra, very weak complaints, seems to be dizziness, lightheaded sensation, she stopped the Keppra, was switched from Zonegran 50 mg every night since December 2022, she still complains of not feeling well, contributed to Zonegran  She is still able to do some house chores, but increased memory loss, today's MoCA examination 15/30,   She continued complaints recurrent spells of not feeling well, blurry vision, couple times each month, lasting for 10 minutes, both patient and her daughter are very resistant  taking antiepileptic medications, will take of Zonegran now,  Update June 06, 2022 SS: In the ER 04/28/22 for dizziness, CT head showed no acute stroke. Last night spell of lightheadedness, she laid down for 20 minutes, spells often associated with blurry vision, right hand weakness. She hadn't eaten in awhile when this happened. Sometimes glucose could be low in the 80's. Most spells associated with long periods of time between eating.   Labs at last visit 11/29/21 A1c 6.0, TSH 0.313, RPR negative, B12 265, CRP 11, sed rate 46, ANA negative  Update December 10, 2022 SS: We tried memory testing, but took extended amount of time, she is sharp, turning 89 next week. Continues with spells when finishes eats, vision is not as bright, not necessarily blurry. Right arm feels weak. Happens few times a week. Getting around with walker due to needing knee replacement. Should be on aspirin 81 mg. BP 129/70, On Lipitor. PCP follows cholesterol. Watches her diet. Does her own ADLs  Update March 06, 2023 SS: Prolonged 66-hour ambulatory video EEG was normal.  No seizures or events captured. She doesn't remember if she had any spells during EEG, but no push buttons. Still spells few times a week, before or after she eats. Gets cloudy vision, sometimes right arm weak, feels back to normal in 15 minutes. Typically eats dinner around 8:30 PM, gets up 11 AM, doesn't eat until 1 PM. Daughter leaves snacks, she doesn't eat it.  Does her own ADLs. Her mind is sharp.   REVIEW OF SYSTEMS: Out of a complete 14 system review of symptoms, the patient complains only of the following symptoms, and all other reviewed systems are negative.  See HPI  ALLERGIES: Allergies  Allergen Reactions   Cefoxitin    Ceftin [Cefuroxime Axetil] Other (See Comments)    Seeing lights   Codeine Other (See Comments)    Unknown    HOME MEDICATIONS: Outpatient Medications Prior to Visit  Medication Sig Dispense Refill   amLODipine (NORVASC) 5  MG tablet Take 5 mg by mouth at bedtime.      aspirin EC 81 MG tablet Take 81 mg by mouth daily. Swallow whole.     atorvastatin (LIPITOR) 10 MG tablet Take 10 mg by mouth daily.     benazepril (LOTENSIN) 40 MG tablet Take 40 mg by mouth at bedtime.      metoprolol succinate (TOPROL-XL) 100 MG 24 hr tablet Take 100 mg by mouth daily. Take with or immediately following a meal.     Cholecalciferol (VITAMIN D3 PO) Take 1 capsule by mouth daily.     No facility-administered medications prior to visit.    PAST MEDICAL HISTORY: Past Medical History:  Diagnosis Date   Hypertension    Stroke [facility])     PAST SURGICAL HISTORY: Past Surgical History:  Procedure Laterality Date   PARTIAL HYSTERECTOMY      FAMILY HISTORY: Family History  Adopted: Yes  Problem Relation Age of Onset   Other Mother        unsure of history   Other Father        unsure of history    SOCIAL HISTORY: Social History   Socioeconomic History   Marital status: Widowed    Spouse name: Not on file   Number of children: 6   Years of education: 10th grade   Highest education level: Not on file  Occupational History   Occupation: Retired  Tobacco Use   Smoking status: Never   Smokeless tobacco: Never  Vaping Use   Vaping Use: Never used  Substance and Sexual Activity   Alcohol use: No   Drug use: Never   Sexual activity: Never  Other Topics Concern   Not on file  Social History Narrative   Right-handed.   No daily use of caffeine.   She lives at home with three of her children (she has four living).   Social Determinants of Health   Financial Resource Strain: Not on file  Food Insecurity: Not on file  Transportation Needs: Not on file  Physical Activity: Not on file  Stress: Not on file  Social Connections: Not on file  Intimate Partner Violence: Not on file   PHYSICAL EXAM  Vitals:   03/06/23 1033  BP: 115/72  Pulse: 76  Resp: 16  Weight: 125 lb (56.7 kg)   Body mass index is 22.86  kg/m.  Generalized: Well developed, in no acute distress, seated in wheelchair Neurological examination  Mentation: Alert oriented to time, place, history is provided by patient and her daughter, both are very pleasant. Follows all commands speech and language fluent. Very attentive and contributory during visit.  Cranial nerve II-XII: Pupils were equal round reactive to light. Extraocular movements were full, visual field were full on confrontational test. Facial sensation and strength were normal. Head turning and shoulder shrug  were normal and symmetric. Motor: The motor testing reveals 5 over 5 strength of all 4 extremities. Good symmetric  motor tone is noted throughout.  Sensory: Sensory testing is intact to soft touch on all 4 extremities. No evidence of extinction is noted.  Coordination: Cerebellar testing reveals good finger-nose-finger and heel-to-shin bilaterally.  Gait and station: Has to push off from seated position, gait is wide based, cautious, able to walk with my assistance  DIAGNOSTIC DATA (LABS, IMAGING, TESTING) - I reviewed patient records, labs, notes, testing and imaging myself where available.  Lab Results  Component Value Date   WBC 7.4 11/23/2022   HGB 12.8 11/23/2022   HCT 38.7 11/23/2022   MCV 95.1 11/23/2022   PLT 537 (H) 11/23/2022      Component Value Date/Time   NA 136 11/23/2022 1553   K 3.7 11/23/2022 1553   CL 101 11/23/2022 1553   CO2 26 11/23/2022 1553   GLUCOSE 111 (H) 11/23/2022 1553   BUN 8 11/23/2022 1553   CREATININE 0.81 11/23/2022 1553   CALCIUM 8.8 (L) 11/23/2022 1553   GFRNONAA >60 11/23/2022 1553   GFRAA >60 06/07/2017 1422   No results found for: "CHOL", "HDL", "LDLCALC", "LDLDIRECT", "TRIG", "CHOLHDL" Lab Results  Component Value Date   HGBA1C 6.0 (H) 11/29/2021   Lab Results  Component Value Date   VITAMINB12 265 11/29/2021   Lab Results  Component Value Date   TSH 0.313 (L) 11/29/2021    Margie Ege, AGNP-C, DNP  03/06/2023, 11:40 AM [facility] [address], [address] [place], [place] [address] [phone]

## 2023-03-06 NOTE — Patient Instructions (Addendum)
Eat more frequent meals, have snacks  continue to monitor spells  Keep aspirin 81 mg daily daily. Strict management of vascular risk factors with a goal BP less than 130/90, A1c less than 7.0, LDL less than 70 for secondary stroke prevention

## 2023-03-28 ENCOUNTER — Ambulatory Visit (INDEPENDENT_AMBULATORY_CARE_PROVIDER_SITE_OTHER): Payer: Medicare Other | Admitting: Cardiology

## 2023-03-28 ENCOUNTER — Encounter (HOSPITAL_BASED_OUTPATIENT_CLINIC_OR_DEPARTMENT_OTHER): Payer: Self-pay | Admitting: Cardiology

## 2023-03-28 VITALS — BP 104/78 | HR 62 | Ht 62.0 in | Wt 124.0 lb

## 2023-03-28 DIAGNOSIS — I4729 Other ventricular tachycardia: Secondary | ICD-10-CM | POA: Diagnosis not present

## 2023-03-28 DIAGNOSIS — I10 Essential (primary) hypertension: Secondary | ICD-10-CM

## 2023-03-28 DIAGNOSIS — I493 Ventricular premature depolarization: Secondary | ICD-10-CM | POA: Diagnosis not present

## 2023-03-28 DIAGNOSIS — I471 Supraventricular tachycardia, unspecified: Secondary | ICD-10-CM

## 2023-03-28 DIAGNOSIS — Z8673 Personal history of transient ischemic attack (TIA), and cerebral infarction without residual deficits: Secondary | ICD-10-CM

## 2023-03-28 NOTE — Progress Notes (Unsigned)
Cardiology Office Note:  .   Date:  03/28/2023  ID:  Danielle Vaughan, DOB 01/12/1933, MRN [id number] PCP: Georgianne Fick, MD  [facility] Providers Cardiologist:  Jodelle Red, MD {  History of Present Illness: .   Danielle Vaughan is a 89 y.o. female with a hx of CVA, hypertension, hyperlipidemia who is seen for follow up today. I initially met her 12/09/19 as a new consult at the request of Georgianne Fick, MD for the evaluation and management of stroke.   Neuro history: History of intermittent blurry vision, right hand weakness since 06/2019. She had CT head for these symptoms, which noted generalized atrophy, supratentorial small vessel disease, and subacute right parieto-occipital PCA infarct.    Zio monitor 12/06/19: 14 days. Min HR 42 bpm, max HR 138 bpm, avg 62 bpm. 1 run of NSVT of 4 beats, avg rate 115 bpm. 41 PSVT events, fastest was 8 beats at 138 bpm, longest was 13 beats at 106 bpm. PVC burden 15.8%. Rare atrial ectopy  Today: Doing well overall overall. Has occasional blurry spells/weakness, following with neurology for this. No syncope or recent falls. Has not had any significant cardiac symptoms. Tolerating medications.  ROS: Denies chest pain, shortness of breath at rest or with normal exertion. No PND, orthopnea, LE edema or unexpected weight gain. No syncope or palpitations. ROS otherwise negative except as noted.   Studies Reviewed: [name]    EKG:     not ordered today  Physical Exam:   VS:  BP 104/78   Pulse 95   Ht 5\' 2"  (1.575 m)   Wt 124 lb (56.2 kg)   SpO2 (!) 40%   BMI 22.68 kg/m    Wt Readings from Last 3 Encounters:  03/28/23 124 lb (56.2 kg)  03/06/23 125 lb (56.7 kg)  12/10/22 125 lb (56.7 kg)    GEN: Well nourished, well developed in no acute distress HEENT: Normal, moist mucous membranes NECK: No JVD CARDIAC: regular rhythm with rare ectopy, normal S1 and S2, no rubs or gallops. No murmur. VASCULAR: Radial and DP pulses 2+  bilaterally. No carotid bruits RESPIRATORY:  Clear to auscultation without rales, wheezing or rhonchi  ABDOMEN: Soft, non-tender, non-distended MUSCULOSKELETAL:  Ambulates independently SKIN: Warm and dry, no edema NEUROLOGIC:  Alert and oriented x 3. No focal neuro deficits noted. PSYCHIATRIC:  Normal affect    ASSESSMENT AND PLAN: .   Blurry spells/intermittent weakness and lightheadedness -not orthostatic -following with neurology -symptoms are sporadic, often associated with eating -no syncope; reviewed red flag signs that need immediate medical attention   History of stroke: -CT with subacute right parieto-occipital PCA infarct, with generalized atrophy and small vessel changes -continue 81 mg daily -continue atorvastatin 10 mg -echo, monitor as above -CV risk factor modification, as below.   Nonsustained ventricular tachycardia, paroxysmal SVT, frequent PVCs per monitor: -she is asymptomatic -echo as above -continue metoprolol -need to do manual or pulse ox pulse. Machine reads artificially low with PVCs   Hypertension: near goal today -continue amlodipine 5 mg daily -continue benazepril 40 mg daily -continue metoprolol succinate 100 mg daily   Hyperlipidemia: -continue atorvastatin 10 mg daily   Cardiac risk counseling and prevention recommendations: -recommend heart healthy/Mediterranean diet, with whole grains, fruits, vegetable, fish, lean meats, nuts, and olive oil. Limit salt. -recommend moderate walking, 3-5 times/week for 30-50 minutes each session. Aim for at least 150 minutes.week. Goal should be pace of 3 miles/hours, or walking 1.5 miles in 30 minutes -recommend  avoidance of tobacco products. Avoid excess alcohol.   Dispo: 6 months  Signed, Jodelle Red, MD   Jodelle Red, MD, PhD, [facility] [place]  [facility]  [place]  [facility] at [facility] at [facility] [address], [address] [place], [place] [address] [phone]

## 2023-03-28 NOTE — Patient Instructions (Signed)
Medication Instructions:  Your physician recommends that you continue on your current medications as directed. Please refer to the Current Medication list given to you today.  *If you need a refill on your cardiac medications before your next appointment, please call your pharmacy*  Follow-Up: At [facility], you and your health needs are our priority.  As part of our continuing mission to provide you with exceptional heart care, we have created designated Provider Care Teams.  These Care Teams include your primary Cardiologist (physician) and Advanced Practice Providers (APPs -  Physician Assistants and Nurse Practitioners) who all work together to provide you with the care you need, when you need it.  Your next appointment:   6 month(s)  Provider:   Bridgette Christopher, MD     

## 2023-04-01 ENCOUNTER — Encounter (HOSPITAL_BASED_OUTPATIENT_CLINIC_OR_DEPARTMENT_OTHER): Payer: Self-pay | Admitting: Cardiology

## 2023-04-10 DIAGNOSIS — R609 Edema, unspecified: Secondary | ICD-10-CM | POA: Diagnosis not present

## 2023-04-15 DIAGNOSIS — E782 Mixed hyperlipidemia: Secondary | ICD-10-CM | POA: Diagnosis not present

## 2023-04-15 DIAGNOSIS — R7303 Prediabetes: Secondary | ICD-10-CM | POA: Diagnosis not present

## 2023-04-15 DIAGNOSIS — N182 Chronic kidney disease, stage 2 (mild): Secondary | ICD-10-CM | POA: Diagnosis not present

## 2023-04-15 DIAGNOSIS — R600 Localized edema: Secondary | ICD-10-CM | POA: Diagnosis not present

## 2023-04-15 DIAGNOSIS — I471 Supraventricular tachycardia, unspecified: Secondary | ICD-10-CM | POA: Diagnosis not present

## 2023-04-24 DIAGNOSIS — I447 Left bundle-branch block, unspecified: Secondary | ICD-10-CM | POA: Diagnosis not present

## 2023-04-24 DIAGNOSIS — D473 Essential (hemorrhagic) thrombocythemia: Secondary | ICD-10-CM | POA: Diagnosis not present

## 2023-04-24 DIAGNOSIS — I10 Essential (primary) hypertension: Secondary | ICD-10-CM | POA: Diagnosis not present

## 2023-04-24 DIAGNOSIS — R569 Unspecified convulsions: Secondary | ICD-10-CM | POA: Diagnosis not present

## 2023-04-24 DIAGNOSIS — N182 Chronic kidney disease, stage 2 (mild): Secondary | ICD-10-CM | POA: Diagnosis not present

## 2023-04-24 DIAGNOSIS — E782 Mixed hyperlipidemia: Secondary | ICD-10-CM | POA: Diagnosis not present

## 2023-04-24 DIAGNOSIS — I639 Cerebral infarction, unspecified: Secondary | ICD-10-CM | POA: Diagnosis not present

## 2023-04-24 DIAGNOSIS — R7303 Prediabetes: Secondary | ICD-10-CM | POA: Diagnosis not present

## 2023-04-24 DIAGNOSIS — R5383 Other fatigue: Secondary | ICD-10-CM | POA: Diagnosis not present

## 2023-05-01 DIAGNOSIS — Z0000 Encounter for general adult medical examination without abnormal findings: Secondary | ICD-10-CM | POA: Diagnosis not present

## 2023-05-01 DIAGNOSIS — D473 Essential (hemorrhagic) thrombocythemia: Secondary | ICD-10-CM | POA: Diagnosis not present

## 2023-05-01 DIAGNOSIS — Z23 Encounter for immunization: Secondary | ICD-10-CM | POA: Diagnosis not present

## 2023-05-01 DIAGNOSIS — R7303 Prediabetes: Secondary | ICD-10-CM | POA: Diagnosis not present

## 2023-05-01 DIAGNOSIS — I471 Supraventricular tachycardia, unspecified: Secondary | ICD-10-CM | POA: Diagnosis not present

## 2023-05-01 DIAGNOSIS — I447 Left bundle-branch block, unspecified: Secondary | ICD-10-CM | POA: Diagnosis not present

## 2023-05-01 DIAGNOSIS — I639 Cerebral infarction, unspecified: Secondary | ICD-10-CM | POA: Diagnosis not present

## 2023-05-01 DIAGNOSIS — I10 Essential (primary) hypertension: Secondary | ICD-10-CM | POA: Diagnosis not present

## 2023-05-01 DIAGNOSIS — R569 Unspecified convulsions: Secondary | ICD-10-CM | POA: Diagnosis not present

## 2023-05-01 DIAGNOSIS — N182 Chronic kidney disease, stage 2 (mild): Secondary | ICD-10-CM | POA: Diagnosis not present

## 2023-05-01 DIAGNOSIS — E782 Mixed hyperlipidemia: Secondary | ICD-10-CM | POA: Diagnosis not present

## 2023-05-12 DIAGNOSIS — H04123 Dry eye syndrome of bilateral lacrimal glands: Secondary | ICD-10-CM | POA: Diagnosis not present

## 2023-05-12 DIAGNOSIS — H524 Presbyopia: Secondary | ICD-10-CM | POA: Diagnosis not present

## 2023-05-14 ENCOUNTER — Ambulatory Visit: Payer: Medicare Other | Admitting: Neurology

## 2023-06-24 DIAGNOSIS — H04123 Dry eye syndrome of bilateral lacrimal glands: Secondary | ICD-10-CM | POA: Diagnosis not present

## 2023-06-24 DIAGNOSIS — H353132 Nonexudative age-related macular degeneration, bilateral, intermediate dry stage: Secondary | ICD-10-CM | POA: Diagnosis not present

## 2023-06-25 ENCOUNTER — Ambulatory Visit: Payer: Medicare Other | Admitting: Neurology

## 2023-06-27 DIAGNOSIS — M170 Bilateral primary osteoarthritis of knee: Secondary | ICD-10-CM | POA: Diagnosis not present

## 2023-08-03 ENCOUNTER — Emergency Department (HOSPITAL_COMMUNITY): Payer: Medicare Other

## 2023-08-03 ENCOUNTER — Other Ambulatory Visit: Payer: Self-pay

## 2023-08-03 ENCOUNTER — Emergency Department (HOSPITAL_BASED_OUTPATIENT_CLINIC_OR_DEPARTMENT_OTHER)
Admission: EM | Admit: 2023-08-03 | Discharge: 2023-08-03 | Disposition: A | Discharge status: Home / Self Care | Payer: Medicare Other | Attending: Emergency Medicine | Admitting: Emergency Medicine

## 2023-08-03 ENCOUNTER — Emergency Department (HOSPITAL_BASED_OUTPATIENT_CLINIC_OR_DEPARTMENT_OTHER): Payer: Medicare Other

## 2023-08-03 ENCOUNTER — Encounter (HOSPITAL_BASED_OUTPATIENT_CLINIC_OR_DEPARTMENT_OTHER): Payer: Self-pay | Admitting: *Deleted

## 2023-08-03 DIAGNOSIS — Z79899 Other long term (current) drug therapy: Secondary | ICD-10-CM | POA: Diagnosis not present

## 2023-08-03 DIAGNOSIS — I10 Essential (primary) hypertension: Secondary | ICD-10-CM | POA: Insufficient documentation

## 2023-08-03 DIAGNOSIS — R42 Dizziness and giddiness: Secondary | ICD-10-CM | POA: Insufficient documentation

## 2023-08-03 DIAGNOSIS — Z7982 Long term (current) use of aspirin: Secondary | ICD-10-CM | POA: Insufficient documentation

## 2023-08-03 DIAGNOSIS — G9389 Other specified disorders of brain: Secondary | ICD-10-CM | POA: Diagnosis not present

## 2023-08-03 DIAGNOSIS — Z8673 Personal history of transient ischemic attack (TIA), and cerebral infarction without residual deficits: Secondary | ICD-10-CM | POA: Insufficient documentation

## 2023-08-03 DIAGNOSIS — I6782 Cerebral ischemia: Secondary | ICD-10-CM | POA: Diagnosis not present

## 2023-08-03 DIAGNOSIS — R9089 Other abnormal findings on diagnostic imaging of central nervous system: Secondary | ICD-10-CM | POA: Diagnosis not present

## 2023-08-03 DIAGNOSIS — R001 Bradycardia, unspecified: Secondary | ICD-10-CM | POA: Diagnosis not present

## 2023-08-03 HISTORY — DX: Dizziness and giddiness: R42

## 2023-08-03 LAB — CBC
HCT: 37.8 % (ref 36.0–46.0)
Hemoglobin: 12.5 g/dL (ref 12.0–15.0)
MCH: 30.9 pg (ref 26.0–34.0)
MCHC: 33.1 g/dL (ref 30.0–36.0)
MCV: 93.6 fL (ref 80.0–100.0)
Platelets: 563 10*3/uL — ABNORMAL HIGH (ref 150–400)
RBC: 4.04 MIL/uL (ref 3.87–5.11)
RDW: 18 % — ABNORMAL HIGH (ref 11.5–15.5)
WBC: 6.6 10*3/uL (ref 4.0–10.5)
nRBC: 0 % (ref 0.0–0.2)

## 2023-08-03 LAB — COMPREHENSIVE METABOLIC PANEL
ALT: 8 U/L (ref 0–44)
AST: 15 U/L (ref 15–41)
Albumin: 4.3 g/dL (ref 3.5–5.0)
Alkaline Phosphatase: 46 U/L (ref 38–126)
Anion gap: 7 (ref 5–15)
BUN: 11 mg/dL (ref 8–23)
CO2: 30 mmol/L (ref 22–32)
Calcium: 9.3 mg/dL (ref 8.9–10.3)
Chloride: 102 mmol/L (ref 98–111)
Creatinine, Ser: 0.78 mg/dL (ref 0.44–1.00)
GFR, Estimated: >60 mL/min (ref >60)
Glucose, Bld: 91 mg/dL (ref 70–99)
Potassium: 4.2 mmol/L (ref 3.5–5.1)
Sodium: 139 mmol/L (ref 135–145)
Total Bilirubin: 0.4 mg/dL (ref 0.3–1.2)
Total Protein: 7.4 g/dL (ref 6.5–8.1)

## 2023-08-03 LAB — URINALYSIS, ROUTINE W REFLEX MICROSCOPIC
Bacteria, UA: NONE SEEN
Bilirubin Urine: NEGATIVE
Glucose, UA: NEGATIVE mg/dL
Hgb urine dipstick: NEGATIVE
Ketones, ur: NEGATIVE mg/dL
Nitrite: NEGATIVE
Protein, ur: NEGATIVE mg/dL
Specific Gravity, Urine: 1.011 (ref 1.005–1.030)
pH: 6 (ref 5.0–8.0)

## 2023-08-03 LAB — RAPID URINE DRUG SCREEN, HOSP PERFORMED
Amphetamines: NOT DETECTED
Barbiturates: NOT DETECTED
Benzodiazepines: NOT DETECTED
Cocaine: NOT DETECTED
Opiates: NOT DETECTED
Tetrahydrocannabinol: NOT DETECTED

## 2023-08-03 LAB — PROTIME-INR
INR: 1.1 (ref 0.8–1.2)
Prothrombin Time: 14 s (ref 11.4–15.2)

## 2023-08-03 MED ORDER — LORAZEPAM 1 MG PO TABS
0.5000 mg | ORAL_TABLET | ORAL | Status: AC | PRN
  Administered 2023-08-03: 0.5 mg via ORAL
  Filled 2023-08-03: qty 1

## 2023-08-03 MED ORDER — MECLIZINE HCL 12.5 MG PO TABS: 12.5000 mg | ORAL_TABLET | Freq: Two times a day (BID) | ORAL | 0 refills | Status: DC | PRN

## 2023-08-03 NOTE — ED Provider Notes (Signed)
  Physical Exam  BP 124/77   Pulse (!) 55   Temp 97.7 F (36.5 C)   Resp 17   SpO2 100%   Physical Exam  Procedures  Procedures  ED Course / MDM    Medical Decision Making Patient was transferred [facility] to get [facility] for dizziness.  MRI did not show any stroke.  Patient walks with a walker at baseline is able to stand up with assistance.  Will prescribe meclizine as needed  Problems Addressed: Vertigo: acute illness or injury  Amount and/or Complexity of Data Reviewed Labs: ordered. Decision-making details documented in ED Course. Radiology: ordered.  Risk Prescription drug management.          Charlynne Pander, MD 08/03/23 Zollie Pee

## 2023-08-03 NOTE — ED Notes (Signed)
Blood drawn and sent to lab.

## 2023-08-03 NOTE — ED Notes (Signed)
To MRI

## 2023-08-03 NOTE — ED Triage Notes (Signed)
Pt reports that she has had dizziness for the past two days.  Pt reports that it is positional and increases with movement.  Hx of vertigo, no focal weakness or neuro deficits.

## 2023-08-03 NOTE — ED Provider Notes (Signed)
Pt sent from [facility] ED for MRI.   Pt notes dizziness, that is now markedly improved, MRI results pending. Signed out to Dr Silverio Lay.     Cathren Laine, MD 08/03/23 1539

## 2023-08-03 NOTE — ED Notes (Signed)
MRI notified patient is here at request of MD.

## 2023-08-03 NOTE — Discharge Instructions (Addendum)
You have vertigo and your MRI did not show a stroke today  Please take meclizine 12.5 mg twice daily as needed  See your doctor for follow-up  Return to ER if you have worse vertigo or trouble walking

## 2023-08-03 NOTE — ED Provider Notes (Signed)
[place] EMERGENCY DEPARTMENT AT [facility] Provider Note   CSN: [id number] Arrival date & time: 08/03/23  0102     History  Chief Complaint  Patient presents with   Dizziness    Danielle Vaughan is a 89 y.o. female.   Dizziness    89 year old female with medical history significant for HTN, vertigo, prior CVA with a history of a right occipital stroke who presents to the emergency department with dizziness.  The patient states that for the last 2 days she has had room spinning dizziness.  Symptoms are worse with positional changes and movement.  Symptoms have been persistent for the past few days.  She has been unsteady on her feet.  She denies any difficulty speaking or swallowing, new focal numbness or weakness.  No known infectious symptoms.  Home Medications Prior to Admission medications   Medication Sig Start Date End Date Taking? Authorizing Provider  amLODipine (NORVASC) 5 MG tablet Take 5 mg by mouth at bedtime.     [provider]  aspirin EC 81 MG tablet Take 81 mg by mouth daily. Swallow whole.    [provider]  atorvastatin (LIPITOR) 10 MG tablet Take 10 mg by mouth daily. Patient not taking: Reported on 03/28/2023 11/11/19   [provider]  benazepril (LOTENSIN) 40 MG tablet Take 40 mg by mouth at bedtime.     [provider]  metoprolol succinate (TOPROL-XL) 100 MG 24 hr tablet Take 100 mg by mouth daily. Take with or immediately following a meal.    [provider]      Allergies    Cefoxitin, Ceftin [cefuroxime axetil], and Codeine    Review of Systems   Review of Systems  Neurological:  Positive for dizziness.  All other systems reviewed and are negative.   Physical Exam Updated Vital Signs BP (!) 153/90   Pulse (!) 54   Temp 97.7 F (36.5 C)   Resp 15   SpO2 100%  Physical Exam Vitals and nursing note reviewed.  Constitutional:      General: She is not in acute distress.    Appearance:  She is well-developed.  HENT:     Head: Normocephalic and atraumatic.  Eyes:     Conjunctiva/sclera: Conjunctivae normal.     Pupils: Pupils are equal, round, and reactive to light.  Cardiovascular:     Rate and Rhythm: Normal rate and regular rhythm.  Pulmonary:     Effort: Pulmonary effort is normal. No respiratory distress.     Breath sounds: Normal breath sounds.  Abdominal:     General: There is no distension.     Palpations: Abdomen is soft.     Tenderness: There is no abdominal tenderness. There is no guarding.  Musculoskeletal:        General: No swelling, deformity or signs of injury.     Cervical back: Neck supple.  Skin:    General: Skin is warm and dry.     Capillary Refill: Capillary refill takes less than 2 seconds.     Findings: No lesion or rash.  Neurological:     General: No focal deficit present.     Mental Status: She is alert. Mental status is at baseline.     Comments: MENTAL STATUS EXAM:    Orientation: Alert and oriented to person, place and time.  Memory: Cooperative, follows commands well.  Language: Speech is clear and language is normal.   CRANIAL NERVES:    CN 2 (Optic):  Visual fields intact to confrontation. Left homonomous hemianopsia noted CN 3,4,6 (EOM): Pupils equal and reactive to light. Full extraocular eye movement without nystagmus.  CN 5 (Trigeminal): Facial sensation is normal, no weakness of masticatory muscles.  CN 7 (Facial): No facial weakness or asymmetry.  CN 8 (Auditory): Auditory acuity grossly normal.  CN 9,10 (Glossophar): The uvula is midline, the palate elevates symmetrically.  CN 11 (spinal access): Normal sternocleidomastoid and trapezius strength.  CN 12 (Hypoglossal): The tongue is midline. No atrophy or fasciculations.[name]   MOTOR:  Muscle Strength: 5/5RUE, 5/5LUE, 5/5RLE, 5/5LLE.   COORDINATION:   No tremor. Left sided dysmetria noted  SENSATION:   Intact to light touch all four extremities.  GAIT: Gait not assessed   Psychiatric:        Mood and Affect: Mood normal.     ED Results / Procedures / Treatments   Labs (all labs ordered are listed, but only abnormal results are displayed) Labs Reviewed  CBC - Abnormal; Notable for the following components:      Result Value   RDW 18.0 (*)    Platelets 563 (*)    All other components within normal limits  URINALYSIS, ROUTINE W REFLEX MICROSCOPIC - Abnormal; Notable for the following components:   Leukocytes,Ua TRACE (*)    All other components within normal limits  PROTIME-INR  COMPREHENSIVE METABOLIC PANEL  RAPID URINE DRUG SCREEN, HOSP PERFORMED    EKG EKG Interpretation Date/Time:  Sunday August 03 2023 09:21:23 EST Ventricular Rate:  51 PR Interval:  162 QRS Duration:  126 QT Interval:  462 QTC Calculation: 425 R Axis:   -17  Text Interpretation: Sinus bradycardia with occasional Premature ventricular complexes and Premature atrial complexes Left bundle branch block Abnormal ECG Compared to prior EKG, PVCs present Confirmed by Ernie Avena ([id number]) on 08/03/2023 10:31:41 AM  Radiology CT HEAD WO CONTRAST  Result Date: 08/03/2023 CLINICAL DATA:  Dizziness for a few days EXAM: CT HEAD WITHOUT CONTRAST TECHNIQUE: Contiguous axial images were obtained from the base of the skull through the vertex without intravenous contrast. RADIATION DOSE REDUCTION: This exam was performed according to the departmental dose-optimization program which includes automated exposure control, adjustment of the mA and/or kV according to patient size and/or use of iterative reconstruction technique. COMPARISON:  Head CT 04/28/2022 FINDINGS: Brain: Cortically based mineralization involving the right more than left occipital and parietal lobes attributed to dystrophic infarct related calcification. Mild involvement in the posterior right frontal cortex on 2:22. No evidence of acute infarct, hemorrhage, hydrocephalus, mass, or collection. Chronic small vessel ischemia in  the cerebral white matter. Vascular: No hyperdense vessel or unexpected calcification. Skull: Normal. Negative for fracture or focal lesion. Sinuses/Orbits: No acute finding. IMPRESSION: No acute finding. Chronic small vessel ischemia and remote cortical infarcts with mineralization. Electronically Signed   By: Tiburcio Pea M.D.   On: 08/03/2023 11:42    Procedures Procedures    Medications Ordered in ED Medications - No data to display  ED Course/ Medical Decision Making/ A&P                                 Medical Decision Making Amount and/or Complexity of Data Reviewed Labs: ordered. Radiology: ordered.    89 year old female with medical history significant for HTN, vertigo, prior CVA with a history of a right occipital stroke who presents to the emergency department with dizziness.  The patient states that for the  last 2 days she has had room spinning dizziness.  Symptoms are worse with positional changes and movement.  Symptoms have been persistent for the past few days.  She has been unsteady on her feet.  She denies any difficulty speaking or swallowing, new focal numbness or weakness.  No known infectious symptoms.  On arrival, the patient was afebrile, bradycardic heart rate 54, hemodynamically stable, BP 120/80, saturating 99% on room air.  Sinus bradycardia noted on cardiac telemetry.  Physical exam revealed left-sided homonymous hemianopsia which given the patient's history of right occipital stroke fits with prior deficit.  The patient was unaware that she had this visual deficit.  She presents with 2 days of vertiginous symptoms.  Unable to fully cooperate with hints exam, no clear vertical skew, no clear nystagmus.    Laboratory evaluation revealed urinalysis without evidence of UTI, UDS normal, CBC without a leukocytosis or anemia, CMP unremarkable with normal renal and liver function, no acute electrolyte abnormality.  CT head: IMPRESSION:  No acute finding.     Chronic small vessel ischemia and remote cortical infarcts with  mineralization.    Given the persistent symptoms over the past 2 days, I recommended the patient be transferred ER to ER to [facility] for MRI imaging to rule out cerebellar CVA.  Dr. Suezanne Jacquet accepted the patient in ER to ER transfer. MRI imaging ordered.  Final Clinical Impression(s) / ED Diagnoses Final diagnoses:  Vertigo  History of ischemic stroke    Rx / DC Orders ED Discharge Orders     None         Ernie Avena, MD 08/03/23 1220

## 2023-08-12 DIAGNOSIS — M170 Bilateral primary osteoarthritis of knee: Secondary | ICD-10-CM | POA: Diagnosis not present

## 2023-08-13 ENCOUNTER — Telehealth: Payer: Self-pay | Admitting: Neurology

## 2023-08-13 NOTE — Telephone Encounter (Signed)
Called and left VM for Daughter to return call

## 2023-08-13 NOTE — Telephone Encounter (Signed)
Pt's daughter Rivka [place] on [place] called needing to discuss some concerns that the family is having. She states that the pt is starting to see red and other things on the walls when there is actually nothing there. Please as well.

## 2023-08-14 NOTE — Telephone Encounter (Signed)
Called and LVM for Glenda to [facility]

## 2023-08-18 NOTE — Telephone Encounter (Signed)
Called and left message with patient for daughter to call back. Will close encounter at this time, pending call back

## 2023-08-19 DIAGNOSIS — M170 Bilateral primary osteoarthritis of knee: Secondary | ICD-10-CM | POA: Diagnosis not present

## 2023-08-26 DIAGNOSIS — M170 Bilateral primary osteoarthritis of knee: Secondary | ICD-10-CM | POA: Diagnosis not present

## 2023-09-16 ENCOUNTER — Ambulatory Visit (HOSPITAL_BASED_OUTPATIENT_CLINIC_OR_DEPARTMENT_OTHER): Payer: Medicare Other | Admitting: Cardiology

## 2023-09-22 ENCOUNTER — Emergency Department (HOSPITAL_BASED_OUTPATIENT_CLINIC_OR_DEPARTMENT_OTHER): Payer: Medicare Other

## 2023-09-22 ENCOUNTER — Encounter (HOSPITAL_BASED_OUTPATIENT_CLINIC_OR_DEPARTMENT_OTHER): Payer: Self-pay | Admitting: Emergency Medicine

## 2023-09-22 ENCOUNTER — Other Ambulatory Visit: Payer: Self-pay

## 2023-09-22 ENCOUNTER — Emergency Department (HOSPITAL_BASED_OUTPATIENT_CLINIC_OR_DEPARTMENT_OTHER)
Admission: EM | Admit: 2023-09-22 | Discharge: 2023-09-22 | Disposition: A | Discharge status: Home / Self Care | Payer: Medicare Other | Attending: Emergency Medicine | Admitting: Emergency Medicine

## 2023-09-22 DIAGNOSIS — W19XXXA Unspecified fall, initial encounter: Secondary | ICD-10-CM | POA: Insufficient documentation

## 2023-09-22 DIAGNOSIS — Z79899 Other long term (current) drug therapy: Secondary | ICD-10-CM | POA: Diagnosis not present

## 2023-09-22 DIAGNOSIS — I10 Essential (primary) hypertension: Secondary | ICD-10-CM | POA: Insufficient documentation

## 2023-09-22 DIAGNOSIS — Z7982 Long term (current) use of aspirin: Secondary | ICD-10-CM | POA: Diagnosis not present

## 2023-09-22 DIAGNOSIS — M4856XA Collapsed vertebra, not elsewhere classified, lumbar region, initial encounter for fracture: Secondary | ICD-10-CM | POA: Diagnosis not present

## 2023-09-22 DIAGNOSIS — S32010A Wedge compression fracture of first lumbar vertebra, initial encounter for closed fracture: Secondary | ICD-10-CM | POA: Insufficient documentation

## 2023-09-22 DIAGNOSIS — M5136 Other intervertebral disc degeneration, lumbar region with discogenic back pain only: Secondary | ICD-10-CM | POA: Diagnosis not present

## 2023-09-22 DIAGNOSIS — S3992XA Unspecified injury of lower back, initial encounter: Secondary | ICD-10-CM | POA: Diagnosis present

## 2023-09-22 DIAGNOSIS — M62838 Other muscle spasm: Secondary | ICD-10-CM | POA: Diagnosis not present

## 2023-09-22 DIAGNOSIS — Z043 Encounter for examination and observation following other accident: Secondary | ICD-10-CM | POA: Diagnosis not present

## 2023-09-22 DIAGNOSIS — Z8673 Personal history of transient ischemic attack (TIA), and cerebral infarction without residual deficits: Secondary | ICD-10-CM | POA: Diagnosis not present

## 2023-09-22 DIAGNOSIS — F0390 Unspecified dementia without behavioral disturbance: Secondary | ICD-10-CM | POA: Diagnosis not present

## 2023-09-22 DIAGNOSIS — S0990XA Unspecified injury of head, initial encounter: Secondary | ICD-10-CM | POA: Diagnosis not present

## 2023-09-22 DIAGNOSIS — M545 Low back pain, unspecified: Secondary | ICD-10-CM | POA: Diagnosis not present

## 2023-09-22 DIAGNOSIS — R001 Bradycardia, unspecified: Secondary | ICD-10-CM | POA: Diagnosis not present

## 2023-09-22 MED ORDER — LIDOCAINE 5 % EX PTCH: 1.0000 | MEDICATED_PATCH | CUTANEOUS | 0 refills | Status: DC

## 2023-09-22 NOTE — ED Triage Notes (Signed)
Fall yesterday. Lost balance. C/o pain in low back. Unsure how she fell  Hr 38 in triage

## 2023-09-22 NOTE — Discharge Instructions (Signed)
Your history, exam, and evaluation today led [place] to getting imaging of your head and back after the fall.  The CT of the head did not show acute fracture or bleeding but your lumbar spine fracture showed a possible L1 superior endplate compression fracture.  As you were not tender on the bone some cells I suspect this is more of a chronic or previous injury and you have more muscle spasm and muscular tenderness causing your pain today.  Given your reassuring exam, we agree with plan for discharge home.  Please use the Lidoderm patches and rest and stay hydrated.  Please follow-up with your primary doctor.  If back pain persist, consider follow-up with outpatient neurosurgery with the back doctors.

## 2023-09-22 NOTE — ED Provider Notes (Signed)
[place] EMERGENCY DEPARTMENT AT [facility] Provider Note   CSN: [id number] Arrival date & time: 09/22/23  1541     History  Chief Complaint  Patient presents with   Danielle Vaughan is a 89 y.o. female.  The history is provided by the patient, a relative and medical records. No language interpreter was used.  Fall This is a recurrent problem. The current episode started more than 2 days ago. The problem occurs rarely. The problem has not changed since onset.Associated symptoms include headaches (resolved now). Pertinent negatives include no chest pain, no abdominal pain and no shortness of breath. Nothing aggravates the symptoms. Nothing relieves the symptoms. She has tried nothing for the symptoms. The treatment provided no relief.       Home Medications Prior to Admission medications   Medication Sig Start Date End Date Taking? Authorizing Provider  amLODipine (NORVASC) 5 MG tablet Take 5 mg by mouth at bedtime.     [provider]  aspirin EC 81 MG tablet Take 81 mg by mouth daily. Swallow whole.    [provider]  atorvastatin (LIPITOR) 10 MG tablet Take 10 mg by mouth daily. Patient not taking: Reported on 03/28/2023 11/11/19   [provider]  benazepril (LOTENSIN) 40 MG tablet Take 40 mg by mouth at bedtime.     [provider]  meclizine (ANTIVERT) 12.5 MG tablet Take 1 tablet (12.5 mg total) by mouth 2 (two) times daily as needed for dizziness. 08/03/23   Charlynne Pander, MD  metoprolol succinate (TOPROL-XL) 100 MG 24 hr tablet Take 100 mg by mouth daily. Take with or immediately following a meal.    [provider]      Allergies    Cefoxitin, Ceftin [cefuroxime axetil], and Codeine    Review of Systems   Review of Systems  Constitutional:  Negative for chills, fatigue and fever.  HENT:  Negative for congestion.   Eyes:  Negative for visual disturbance.  Respiratory:  Negative for cough, chest  tightness, shortness of breath and wheezing.   Cardiovascular:  Negative for chest pain, palpitations (chroinc and no different than normal) and leg swelling.  Gastrointestinal:  Negative for abdominal pain, constipation, diarrhea, nausea and vomiting.  Genitourinary:  Negative for dysuria.  Musculoskeletal:  Positive for back pain. Negative for neck pain and neck stiffness.  Skin:  Negative for rash and wound.  Neurological:  Positive for headaches (resolved now). Negative for speech difficulty, weakness, light-headedness and numbness.  Psychiatric/Behavioral:  Negative for agitation and confusion.   All other systems reviewed and are negative.   Physical Exam Updated Vital Signs BP (!) 157/63   Pulse (!) 38   Temp (!) 97.5 F (36.4 C) (Oral)   SpO2 98%  Physical Exam Vitals and nursing note reviewed.  Constitutional:      General: She is not in acute distress.    Appearance: She is well-developed. She is not ill-appearing, toxic-appearing or diaphoretic.  HENT:     Head: Normocephalic and atraumatic.     Nose: No congestion or rhinorrhea.     Mouth/Throat:     Mouth: Mucous membranes are moist.     Pharynx: No oropharyngeal exudate or posterior oropharyngeal erythema.  Eyes:     Extraocular Movements: Extraocular movements intact.     Conjunctiva/sclera: Conjunctivae normal.     Pupils: Pupils are equal, round, and reactive to light.  Cardiovascular:     Rate and Rhythm: Normal rate and  regular rhythm.     Heart sounds: No murmur heard. Pulmonary:     Effort: Pulmonary effort is normal. No respiratory distress.     Breath sounds: Normal breath sounds. No wheezing, rhonchi or rales.  Chest:     Chest wall: No tenderness.  Abdominal:     General: Abdomen is flat.     Palpations: Abdomen is soft.     Tenderness: There is no abdominal tenderness. There is no guarding or rebound.  Musculoskeletal:        General: Tenderness present. No swelling.     Cervical back: Neck  supple.     Lumbar back: Tenderness present.       Back:     Right lower leg: No edema.     Left lower leg: No edema.  Skin:    General: Skin is warm and dry.     Capillary Refill: Capillary refill takes less than 2 seconds.     Findings: No erythema or rash.  Neurological:     General: No focal deficit present.     Mental Status: She is alert.     Sensory: No sensory deficit.     Motor: No weakness.  Psychiatric:        Mood and Affect: Mood normal.     ED Results / Procedures / Treatments   Labs (all labs ordered are listed, but only abnormal results are displayed) Labs Reviewed - No data to display  EKG EKG Interpretation Date/Time:  Monday September 22 2023 16:05:23 EST Ventricular Rate:  79 PR Interval:  151 QRS Duration:  141 QT Interval:  443 QTC Calculation: 450 R Axis:   11  Text Interpretation: Sinus rhythm Ventricular bigeminy Left bundle branch block when compared to prior, now more persistent bigeminy pattern thad she had some beats of on prior ECG. No STEMI Confirmed by Theda Belfast ([id number]) on 09/22/2023 4:09:13 PM  Radiology No results found.  Procedures Procedures    Medications Ordered in ED Medications - No data to display  ED Course/ Medical Decision Making/ A&P                                 Medical Decision Making Amount and/or Complexity of Data Reviewed Radiology: ordered.    Danielle Vaughan is a 89 y.o. female with a past medical history of hypertension, previous vertigo, previous stroke, dementia, partial seizures, and previous SVT who presents with back pain after a fall.  According to patient, she has had 2 falls over the last several days that she suspects were mechanical.  She reports she did not have any headache, chest pain, neck pain, shortness of breath or abdominal pain before her fall.  She reports that since the fall she has had some pain in her low back and briefly had some headache.  The headache is resolved and she  reports the back pain is mild to moderate intermittently.  She denies any urinary changes and denies any dysuria hematuria or frequency.  Denies any constipation or diarrhea and otherwise denies URI symptoms.  She reports she chronically feels some palpitations and does not feel any different than normal from that standpoint.  Of note, EKG on arrival shows a bigeminy pattern that was showing bradycardia on pulse oximetry but heart rate is actually in the 70s.  She is not feeling lightheaded or near syncopal at this time.  On exam, lungs clear.  Chest  nontender.  Abdomen nontender.  Flanks nontender.  No CVA tenderness.  She had some mild paraspinal tenderness in her right low back but midline was not tender.  Intact sensation strength and pulses in extremities.  Symmetric smile.  Clear speech.  Pupil symmetric and reactive with normal extract movements.  No neck tenderness or head tenderness.  Had a shared tissue making conversation with herself and sister and we agree that given her lack of preceding symptoms and her only having some low back pain after the fall, we will focus on getting imaging to rule out traumatic injuries.  Will get a CT low back and a CT of her head given the transient headache she had.  Because she always has chronic palpitations and her EKG showed a bigeminy pattern, I suspect this is the cause.  We agreed to hold on more extensive workup with labs chest x-ray or urinalysis at this time as she feels she was at her baseline and just wants to make sure she did not break her back.  Will get the CT imaging and if this is reassuring, anticipate discharge home to follow-up with her primary doctor.  If patient change her mind, we will consider getting other labs and workup.         7:32 PM CT head did not show acute fracture or bleeding and CT of the lumbar spine showed age-indeterminate L1 minimal superior endplate compression fracture.  As patient was not tender on the bones I have  less suspicion this is acute.  Patient agrees with plan for PCP and back doctor follow-up and will give prescription for Lidoderm patches.  Patient agrees this plan.  She no other questions or concerns and was discharged in good condition.         Final Clinical Impression(s) / ED Diagnoses Final diagnoses:  Fall, initial encounter  Closed compression fracture of L1 vertebra, initial encounter (HCC)  Muscle spasm  Acute right-sided low back pain without sciatica    Rx / DC Orders ED Discharge Orders          Ordered    lidocaine (LIDODERM) 5 %  Every 24 hours        09/22/23 1934            Clinical Impression: 1. Fall, initial encounter   2. Closed compression fracture of L1 vertebra, initial encounter (HCC)   3. Muscle spasm   4. Acute right-sided low back pain without sciatica     Disposition: Discharge  Condition: Good  I have discussed the results, Dx and Tx plan with the pt(& family if present). He/she/they expressed understanding and agree(s) with the plan. Discharge instructions discussed at great length. Strict return precautions discussed and pt &/or family have verbalized understanding of the instructions. No further questions at time of discharge.    New Prescriptions   LIDOCAINE (LIDODERM) 5 %    Place 1 patch onto the skin daily. Remove & Discard patch within 12 hours or as directed by MD    Follow Up: Georgianne Fick, MD [address] [place] [address] [place] [address] [phone]     [place], [facility] [address] [place] [address] [phone]         Shed Nixon, Canary Brim, MD 09/22/23 Barry Brunner

## 2023-10-10 DIAGNOSIS — M545 Low back pain, unspecified: Secondary | ICD-10-CM | POA: Diagnosis not present

## 2023-10-28 DIAGNOSIS — M170 Bilateral primary osteoarthritis of knee: Secondary | ICD-10-CM | POA: Diagnosis not present

## 2023-11-05 DIAGNOSIS — N182 Chronic kidney disease, stage 2 (mild): Secondary | ICD-10-CM | POA: Diagnosis not present

## 2023-11-05 DIAGNOSIS — E782 Mixed hyperlipidemia: Secondary | ICD-10-CM | POA: Diagnosis not present

## 2023-11-05 DIAGNOSIS — R7303 Prediabetes: Secondary | ICD-10-CM | POA: Diagnosis not present

## 2023-11-12 DIAGNOSIS — D473 Essential (hemorrhagic) thrombocythemia: Secondary | ICD-10-CM | POA: Diagnosis not present

## 2023-11-12 DIAGNOSIS — I10 Essential (primary) hypertension: Secondary | ICD-10-CM | POA: Diagnosis not present

## 2023-11-12 DIAGNOSIS — E782 Mixed hyperlipidemia: Secondary | ICD-10-CM | POA: Diagnosis not present

## 2023-11-12 DIAGNOSIS — H5340 Unspecified visual field defects: Secondary | ICD-10-CM | POA: Diagnosis not present

## 2023-11-12 DIAGNOSIS — I639 Cerebral infarction, unspecified: Secondary | ICD-10-CM | POA: Diagnosis not present

## 2023-11-12 DIAGNOSIS — R7303 Prediabetes: Secondary | ICD-10-CM | POA: Diagnosis not present

## 2023-11-12 DIAGNOSIS — I447 Left bundle-branch block, unspecified: Secondary | ICD-10-CM | POA: Diagnosis not present

## 2023-11-12 DIAGNOSIS — I471 Supraventricular tachycardia, unspecified: Secondary | ICD-10-CM | POA: Diagnosis not present

## 2023-11-12 DIAGNOSIS — R569 Unspecified convulsions: Secondary | ICD-10-CM | POA: Diagnosis not present

## 2023-11-12 DIAGNOSIS — N182 Chronic kidney disease, stage 2 (mild): Secondary | ICD-10-CM | POA: Diagnosis not present

## 2023-12-03 ENCOUNTER — Telehealth: Payer: Self-pay | Admitting: Neurology

## 2023-12-03 ENCOUNTER — Encounter: Payer: Self-pay | Admitting: Neurology

## 2023-12-03 NOTE — Telephone Encounter (Signed)
 Unable to reach pt over the phone, sent letter in mail informing pt of need to reschedule 03/16/24 appt - NP out

## 2023-12-12 ENCOUNTER — Emergency Department (HOSPITAL_COMMUNITY)
Admission: EM | Admit: 2023-12-12 | Discharge: 2023-12-12 | Disposition: A | Discharge status: Home / Self Care | Attending: Emergency Medicine | Admitting: Emergency Medicine

## 2023-12-12 ENCOUNTER — Emergency Department (HOSPITAL_COMMUNITY)

## 2023-12-12 ENCOUNTER — Other Ambulatory Visit: Payer: Self-pay

## 2023-12-12 ENCOUNTER — Encounter (HOSPITAL_COMMUNITY): Payer: Self-pay | Admitting: *Deleted

## 2023-12-12 DIAGNOSIS — F0390 Unspecified dementia without behavioral disturbance: Secondary | ICD-10-CM | POA: Insufficient documentation

## 2023-12-12 DIAGNOSIS — R519 Headache, unspecified: Secondary | ICD-10-CM | POA: Insufficient documentation

## 2023-12-12 DIAGNOSIS — G238 Other specified degenerative diseases of basal ganglia: Secondary | ICD-10-CM | POA: Diagnosis not present

## 2023-12-12 DIAGNOSIS — W1839XA Other fall on same level, initial encounter: Secondary | ICD-10-CM | POA: Diagnosis not present

## 2023-12-12 DIAGNOSIS — S0990XA Unspecified injury of head, initial encounter: Secondary | ICD-10-CM | POA: Diagnosis not present

## 2023-12-12 DIAGNOSIS — Z7982 Long term (current) use of aspirin: Secondary | ICD-10-CM | POA: Insufficient documentation

## 2023-12-12 DIAGNOSIS — S199XXA Unspecified injury of neck, initial encounter: Secondary | ICD-10-CM | POA: Diagnosis not present

## 2023-12-12 DIAGNOSIS — Z043 Encounter for examination and observation following other accident: Secondary | ICD-10-CM | POA: Diagnosis not present

## 2023-12-12 DIAGNOSIS — W19XXXA Unspecified fall, initial encounter: Secondary | ICD-10-CM

## 2023-12-12 NOTE — ED Triage Notes (Signed)
 The pt denies pain any where at present

## 2023-12-12 NOTE — Discharge Instructions (Addendum)
 Your head and neck CT are reassuring today.  We do not see any new abnormalities such as a brain bleed or skull fracture.  I have included your CT reads below for your reference.  You may take up to 1000mg  of tylenol every 6 hours as needed for pain.  Do not take more then 4g per day.  Return to the ER if you develop any uncontrolled nausea or vomiting, changes in vision, severe headache, drowsiness, any other new or concerning symptoms.  " EXAM:  CT HEAD WITHOUT CONTRAST    TECHNIQUE:  Contiguous axial images were obtained from the base of the skull  through the vertex without intravenous contrast.    RADIATION DOSE REDUCTION: This exam was performed according to the  departmental dose-optimization program which includes automated  exposure control, adjustment of the mA and/or kV according to  patient size and/or use of iterative reconstruction technique.    COMPARISON:  09/22/2023    FINDINGS:  Brain: No focal abnormality seen affecting the brainstem or  cerebellum. As seen previously, there is chronic mineralization of  the basal ganglia and of both occipital lobes, more pronounced in  the right occipital lobe than the left occipital lobe. There is also  some calcification of the hippocampus. Some cortical calcification  present at the right frontoparietal junction region. No sign of  acute infarction, mass lesion, hemorrhage, hydrocephalus or  extra-axial collection.    Vascular: There is atherosclerotic calcification of the major  vessels at the base of the brain.    Skull: Negative    Sinuses/Orbits: Clear/normal    Other: None    IMPRESSION:  No acute or traumatic finding. Chronic mineralization of the basal  ganglia and of both occipital lobes, more pronounced in the right  occipital lobe than the left occipital lobe. Some calcification of  the hippocampus. Some cortical calcification at the right  frontoparietal junction region. A calcification is progressive  over  the last several years. No acute or traumatic finding based on  today's fall.      Electronically Signed    By: Paulina Fusi M.D.    On: 12/12/2023 20:01    CT CERVICAL SPINE WITHOUT CONTRAST    TECHNIQUE:  Multidetector CT imaging of the cervical spine was performed without  intravenous contrast. Multiplanar CT image reconstructions were also  generated.    RADIATION DOSE REDUCTION: This exam was performed according to the  departmental dose-optimization program which includes automated  exposure control, adjustment of the mA and/or kV according to  patient size and/or use of iterative reconstruction technique.    COMPARISON:  None Available.    FINDINGS:  Alignment: No traumatic malalignment.    Skull base and vertebrae: No regional fracture or focal bone lesion.    Soft tissues and spinal canal: No traumatic soft tissue finding. No  mass or adenopathy.    Disc levels: Chronic disc height throughout the cervical and upper  thoracic region. No apparent bony stenosis of the canal or foramina.    Upper chest: Lung apices are clear.    Other: None    IMPRESSION:  No acute or traumatic finding. Chronic disc height loss throughout  the cervical and upper thoracic region.      Electronically Signed    By: Paulina Fusi M.D.    On: 12/12/2023 20:03

## 2023-12-12 NOTE — ED Provider Triage Note (Signed)
 Emergency Medicine Provider Triage Evaluation Note  Danielle Vaughan , a 89 y.o. female  was evaluated in triage.  Pt complains of fall.  Reports that she had her wheels of her roller slip on the floor causing her to fall forward.  States she did hit her head.  Denies loss of consciousness.  Denies blood thinners.  States that she was off of the ground and under 2 minutes.  Denies any other complaints.  Review of Systems  Positive:  Negative:   Physical Exam  BP (!) 150/87 (BP Location: Right Arm)   Pulse 68   Temp 97.6 F (36.4 C)   Resp 18   Ht 5\' 2"  (1.575 m)   Wt 56.2 kg   SpO2 97%   BMI 22.66 kg/m  Gen:   Awake, no distress   Resp:  Normal effort  MSK:   Moves extremities without difficulty  Other:  Reassuring neuroexam  Medical Decision Making  Medically screening exam initiated at 7:10 PM.  Appropriate orders placed.  Danielle Vaughan was informed that the remainder of the evaluation will be completed by another provider, this initial triage assessment does not replace that evaluation, and the importance of remaining in the ED until their evaluation is complete.     Al Decant, PA-C 12/12/23 1910

## 2023-12-12 NOTE — ED Provider Notes (Addendum)
  EMERGENCY DEPARTMENT AT [facility] Provider Note   CSN: [id number] Arrival date & time: 12/12/23  1619     History  Chief Complaint  Patient presents with   Danielle Vaughan is a 89 y.o. female with history of CVA, dementia, pretension, presents with concern for a mechanical fall that occurred earlier today.  States she was walking with her walker, and the wheels got caught, causing her to fall. She is not on any blood thinners. She was unsure if she hit her head.  She denies any loss of consciousness.  Denies any vision changes, nausea or vomiting, headache, or pain anywhere in her body.  Denies any chest pain, shortness of breath, dizziness, before the fall.   Fall       Home Medications Prior to Admission medications   Medication Sig Start Date End Date Taking? Authorizing Provider  amLODipine (NORVASC) 5 MG tablet Take 5 mg by mouth at bedtime.     [provider]  aspirin EC 81 MG tablet Take 81 mg by mouth daily. Swallow whole.    [provider]  atorvastatin (LIPITOR) 10 MG tablet Take 10 mg by mouth daily. Patient not taking: Reported on 03/28/2023 11/11/19   [provider]  benazepril (LOTENSIN) 40 MG tablet Take 40 mg by mouth at bedtime.     [provider]  lidocaine (LIDODERM) 5 % Place 1 patch onto the skin daily. Remove & Discard patch within 12 hours or as directed by MD 09/22/23   Tegeler, Canary Brim, MD  meclizine (ANTIVERT) 12.5 MG tablet Take 1 tablet (12.5 mg total) by mouth 2 (two) times daily as needed for dizziness. 08/03/23   Charlynne Pander, MD  metoprolol succinate (TOPROL-XL) 100 MG 24 hr tablet Take 100 mg by mouth daily. Take with or immediately following a meal.    [provider]      Allergies    Cefoxitin, Ceftin [cefuroxime axetil], and Codeine    Review of Systems   Review of Systems  Neurological:  Negative for dizziness.    Physical Exam Updated Vital  Signs BP (!) 153/92   Pulse (!) 58   Temp 97.6 F (36.4 C)   Resp 11   Ht 5\' 2"  (1.575 m)   Wt 56.2 kg   SpO2 100%   BMI 22.66 kg/m  Physical Exam Vitals and nursing note reviewed.  Constitutional:      General: She is not in acute distress.    Appearance: She is well-developed.  HENT:     Head: Normocephalic and atraumatic.  Eyes:     Conjunctiva/sclera: Conjunctivae normal.  Cardiovascular:     Rate and Rhythm: Normal rate and regular rhythm.     Heart sounds: No murmur heard. Pulmonary:     Effort: Pulmonary effort is normal. No respiratory distress.     Breath sounds: Normal breath sounds.  Abdominal:     Palpations: Abdomen is soft.     Tenderness: There is no abdominal tenderness.  Musculoskeletal:        General: No swelling.     Cervical back: Normal range of motion and neck supple.     Comments: No spinous process tenderness over the cervical, thoracic, or lumbar spine  No tenderness palpation of the chest wall diffusely. No tenderness palpation of the bilateral upper or lower extremities diffusely  Patient ambulate to ambulate without difficulty.  Moves all extremities without difficulty  Skin:  General: Skin is warm and dry.     Capillary Refill: Capillary refill takes less than 2 seconds.  Neurological:     General: No focal deficit present.     Mental Status: She is alert.  Psychiatric:        Mood and Affect: Mood normal.     ED Results / Procedures / Treatments   Labs (all labs ordered are listed, but only abnormal results are displayed) Labs Reviewed - No data to display  EKG None  Radiology CT Cervical Spine Wo Contrast Result Date: 12/12/2023 CLINICAL DATA:  Fall with trauma to the head and neck. EXAM: CT CERVICAL SPINE WITHOUT CONTRAST TECHNIQUE: Multidetector CT imaging of the cervical spine was performed without intravenous contrast. Multiplanar CT image reconstructions were also generated. RADIATION DOSE REDUCTION: This exam was  performed according to the departmental dose-optimization program which includes automated exposure control, adjustment of the mA and/or kV according to patient size and/or use of iterative reconstruction technique. COMPARISON:  None Available. FINDINGS: Alignment: No traumatic malalignment. Skull base and vertebrae: No regional fracture or focal bone lesion. Soft tissues and spinal canal: No traumatic soft tissue finding. No mass or adenopathy. Disc levels: Chronic disc height throughout the cervical and upper thoracic region. No apparent bony stenosis of the canal or foramina. Upper chest: Lung apices are clear. Other: None IMPRESSION: No acute or traumatic finding. Chronic disc height loss throughout the cervical and upper thoracic region. Electronically Signed   By: Paulina Fusi M.D.   On: 12/12/2023 20:03   CT Head Wo Contrast Result Date: 12/12/2023 CLINICAL DATA:  Head trauma, moderate-severe. Fall with trauma to the head. EXAM: CT HEAD WITHOUT CONTRAST TECHNIQUE: Contiguous axial images were obtained from the base of the skull through the vertex without intravenous contrast. RADIATION DOSE REDUCTION: This exam was performed according to the departmental dose-optimization program which includes automated exposure control, adjustment of the mA and/or kV according to patient size and/or use of iterative reconstruction technique. COMPARISON:  09/22/2023 FINDINGS: Brain: No focal abnormality seen affecting the brainstem or cerebellum. As seen previously, there is chronic mineralization of the basal ganglia and of both occipital lobes, more pronounced in the right occipital lobe than the left occipital lobe. There is also some calcification of the hippocampus. Some cortical calcification present at the right frontoparietal junction region. No sign of acute infarction, mass lesion, hemorrhage, hydrocephalus or extra-axial collection. Vascular: There is atherosclerotic calcification of the major vessels at the  base of the brain. Skull: Negative Sinuses/Orbits: Clear/normal Other: None IMPRESSION: No acute or traumatic finding. Chronic mineralization of the basal ganglia and of both occipital lobes, more pronounced in the right occipital lobe than the left occipital lobe. Some calcification of the hippocampus. Some cortical calcification at the right frontoparietal junction region. A calcification is progressive over the last several years. No acute or traumatic finding based on today's fall. Electronically Signed   By: Paulina Fusi M.D.   On: 12/12/2023 20:01    Procedures Procedures    Medications Ordered in ED Medications - No data to display  ED Course/ Medical Decision Making/ A&P                                 Medical Decision Making    Differential diagnosis includes but is not limited to orthostatic hypotension, arrhythmia, mechanical fall  ED Course:  Patient well-appearing, stable vital signs aside from a slightly elevated  blood pressure 153/92.  She has a good story for mechanical fall and this is also witnessed by the daughter at bedside.  She reports she currently feels at baseline physically and mentally, which daughter also agrees with.  Denies any pain or other symptoms currently.   She denies any prodromal symptoms before the fall. Low concern for any other etiology to her fall at this time, no indication for further labs at this time.  Patient was placed on monitor in the room which showed normal sinus rhythm.  Her CT head and CT of cervical spine are without any acute abnormalities.  She does not have any tenderness palpation of the upper or lower extremities diffusely, and I witnessed her walking to the bathroom without difficulty, low concern for any other fractures or dislocations at this time.  Patient stable and appropriate for discharge home  Impression: Mechanical fall  Disposition:  The patient was discharged home with instructions to take tylenol as needed for  pain. Return precautions given.  Imaging Studies ordered: I ordered imaging studies including CT head, CT cervical spine I independently visualized the imaging with scope of interpretation limited to determining acute life threatening conditions related to emergency care. Imaging showed no acute abnormalities I agree with the radiologist interpretation   Cardiac Monitoring: / EKG: The patient was maintained on a cardiac monitor.  I personally viewed and interpreted the cardiac monitored which showed an underlying rhythm of: Normal sinus rhythm               Final Clinical Impression(s) / ED Diagnoses Final diagnoses:  Fall, initial encounter    Rx / DC Orders ED Discharge Orders     None         Arabella Merles, PA-C 12/12/23 2059    Arabella Merles, PA-C 12/12/23 2100    Tegeler, Canary Brim, MD 12/12/23 [phone]

## 2023-12-12 NOTE — ED Triage Notes (Signed)
 This pt was walking with her walker and it caused her to fall no head pain but she has had a headache  since the fall no blood thinner

## 2023-12-23 DIAGNOSIS — M25562 Pain in left knee: Secondary | ICD-10-CM | POA: Diagnosis not present

## 2023-12-23 DIAGNOSIS — M25561 Pain in right knee: Secondary | ICD-10-CM | POA: Diagnosis not present

## 2023-12-29 DIAGNOSIS — R441 Visual hallucinations: Secondary | ICD-10-CM | POA: Diagnosis not present

## 2023-12-29 DIAGNOSIS — R410 Disorientation, unspecified: Secondary | ICD-10-CM | POA: Diagnosis not present

## 2024-01-01 DIAGNOSIS — F03C0 Unspecified dementia, severe, without behavioral disturbance, psychotic disturbance, mood disturbance, and anxiety: Secondary | ICD-10-CM | POA: Diagnosis not present

## 2024-01-01 DIAGNOSIS — F0390 Unspecified dementia without behavioral disturbance: Secondary | ICD-10-CM | POA: Diagnosis not present

## 2024-01-01 DIAGNOSIS — R441 Visual hallucinations: Secondary | ICD-10-CM | POA: Diagnosis not present

## 2024-01-16 DIAGNOSIS — M170 Bilateral primary osteoarthritis of knee: Secondary | ICD-10-CM | POA: Diagnosis not present

## 2024-02-09 DIAGNOSIS — R441 Visual hallucinations: Secondary | ICD-10-CM | POA: Diagnosis not present

## 2024-02-09 DIAGNOSIS — F03C0 Unspecified dementia, severe, without behavioral disturbance, psychotic disturbance, mood disturbance, and anxiety: Secondary | ICD-10-CM | POA: Diagnosis not present

## 2024-02-09 DIAGNOSIS — F0390 Unspecified dementia without behavioral disturbance: Secondary | ICD-10-CM | POA: Diagnosis not present

## 2024-02-12 ENCOUNTER — Telehealth: Payer: Self-pay | Admitting: Neurology

## 2024-02-12 ENCOUNTER — Encounter: Payer: Self-pay | Admitting: Neurology

## 2024-02-12 NOTE — Telephone Encounter (Signed)
 No VM box, sent letter in mail informing pt of need to reschedule 03/25/24 appt - NP out

## 2024-03-11 NOTE — Progress Notes (Signed)
 Patient: Danielle Vaughan Date of Birth: 07-May-1933  Reason for Visit: Follow up History from: Patient, daughter Primary Neurologist: Dr. Gracie Vaughan   ASSESSMENT AND PLAN 89 y.o. year old female   1.  Recurrent spells of blurry vision, right hand weakness -Has not been able to tolerate Keppra  or Zonegran  -EEG showed mild background slowing -Prolonged 66-hour ambulatory video EEG was normal -Spells less frequent, none have been significant, continue to eat frequent meals, document spells   2.  Dementia -MOCA 15/27 December 2021, unable to complete since, due to requiring extended period of time.  She is really quite sharp, does her ADLs -PCP offered Namenda 5 mg BID, felt dizzy, she stopped it -We discussed memory medications, unclear benefit of Aricept with potential to lower seizure threshold, as well as GI upset -Discussed the importance of exercise, healthy eating, brain stimulating activities -Has reported some visual hallucinations, in afternoon, mild currently, discussed redirecting, giving activity to focus on during the time, if needed may consider low dose antidepressant  3.  History of right occipital stroke -On aspirin 81 mg daily for secondary stroke prevention  -Keep BP < 130/90, LDL < 70, A1C < 7.0 -MRI brain November 2024 showed no significant finding  4.  Cardiac arrhythmia -Sees cardiology, cardiac monitor showed nonsustained V. tach, paroxysmal SVT; on metoprolol  Follow up in 1 year or sooner if needed.   HISTORY  Danielle Vaughan is a 89 year old female, seen in request by her primary care physician Dr. Gloria Vaughan, Danielle Vaughan for evaluation of abnormal CT scan, she is accompanied by her daughter Danielle Vaughan at today's visit on November 15, 2019,   I have reviewed and summarized the referring note from the referring physician.  She had past medical history of hypertension, hyperlipidemia,   Since September 2020, she reported recurrent similar episode of blurry vision, right  hand weakness following her evening meal on Sunday.  It happens intermittently,   She had a gradual decreased appetite over the years, but still active, able to have help with household chores, she tends to have smaller meal on Sunday, by evening time, when she finished her evening meal, she will often get blurry vision, right hand weakness, to the point of difficulty picking up her fork, symptoms last about 15 minutes, she denied loss of consciousness, denied increased gait abnormality, she has significant right knee pain is a candidate for right knee replacement, ambulate with a cane   For that reason, she was referred for CAT scan, I have personally reviewed CT head without contrast on November 11, 2019, generalized atrophy, supratentorium small vessel disease, subacute right parieto-occipital PCAs infarction   She was started on aspirin 81 mg daily, and Lipitor 10 mg every night since February 11   Update December 08, 2019: At previous visit, she complains of spells only at Sunday, but on further questioning, she reported intermittent spells couple times each week, sudden onset mild confusion, slow reaction, funny feelings, with some visual distortion, sometimes right arm numbness, heaviness.  She describes 1 episode on November 23, 2019, after visiting her son at nursing home, she was sitting in the car with the bright sunshine, she suddenly felt funny, mild visual distortion, right arm heaviness, lasting 10 to 15 minutes, she denied loss of consciousness.  Her daughter also described, when she was having a spells, she often was noticed by days into space, but when she interact with patient, she was a bit slow to react, but able to answer questions  She was seen by cardiologist, had 2 weeks cardiac monitoring, visit and reports are pending   I personally reviewed MRI of the brain without contrast on November 19, 2019 [facility], subacute infarction at the right occipital lobe, moderate  supratentorium small vessel disease   US  carotid artery showed less than 39% stenosis bilaterally Echocardiogram showed normal left ventricular function, moderate asymmetric left ventricular hypertrophy of the basal septal segment, right ventricle systolic click functions normal, left atrial size with severely dilated   UPDATE Oct 30 2020 Dr. Gracie Vaughan: She is accompanied by her daughter going to today's clinical visit, she has not been compliant with her Keppra  xr 500 mg every night, daughter reported that with Keppra , she is doing better, had much less spells of sudden onset blurry vision, right hand weakness, confusion, at least 75% improvement, We again reviewed MRI report, right occipital stroke, moderate supratentorium small vessel disease EEG October 2021 showed no significant abnormality   UPDATE November 29 2021: She is accompanied by her daughter at today's clinical visit, she lives with her daughter and son, still trying to be active at home,  She continue complains of intermittent dizziness, overwhelming sensation episode often associated with her meals, she was put on Keppra  500 mg twice daily in 2021 for similar complaints, suspicious for partial seizure, she reported significant improvement at that time  In addition, MRI of the brain in 2021 showed subacute infarction of the right occipital lobe, moderate supratentorium small vessel disease  Then she began to noticed side effect with Keppra , very weak complaints, seems to be dizziness, lightheaded sensation, she stopped the Keppra , was switched from Zonegran  50 mg every night since December 2022, she still complains of not feeling well, contributed to Zonegran   She is still able to do some house chores, but increased memory loss, today's MoCA examination 15/30,   She continued complaints recurrent spells of not feeling well, blurry vision, couple times each month, lasting for 10 minutes, both patient and her daughter are very resistant  taking antiepileptic medications, will take of Zonegran  now,  Update June 06, 2022 SS: In the ER 04/28/22 for dizziness, CT head showed no acute stroke. Last night spell of lightheadedness, she laid down for 20 minutes, spells often associated with blurry vision, right hand weakness. She hadn't eaten in awhile when this happened. Sometimes glucose could be low in the 80's. Most spells associated with long periods of time between eating.   Labs at last visit 11/29/21 A1c 6.0, TSH 0.313, RPR negative, B12 265, CRP 11, sed rate 46, ANA negative  Update December 10, 2022 SS: We tried memory testing, but took extended amount of time, she is sharp, turning 89 next week. Continues with spells when finishes eats, vision is not as bright, not necessarily blurry. Right arm feels weak. Happens few times a week. Getting around with walker due to needing knee replacement. Should be on aspirin 81 mg. BP 129/70, On Lipitor. PCP follows cholesterol. Watches her diet. Does her own ADLs  Update March 06, 2023 SS: Prolonged 66-hour ambulatory video EEG was normal.  No seizures or events captured. She doesn't remember if she had any spells during EEG, but no push buttons. Still spells few times a week, before or after she eats. Gets cloudy vision, sometimes right arm weak, feels back to normal in 15 minutes. Typically eats dinner around 8:30 PM, gets up 11 AM, doesn't eat until 1 PM. Daughter leaves snacks, she doesn't eat it. Does her own ADLs.  Her mind is sharp.   Update 03/15/24 SS: Less spells, no tell tell spells of right arm weakness and blurry vision to describe. Has dry eyes, sometimes vision is blurry. Memory stable, ask questions about if someone passed away. Does her own ADLs. Walker at home. In ER for fall. PCP started Namenda 5 mg BID in May it made her dizzy. Few times see a little girl, red on the wall, during the afternoon, not scary to her, usually in the afternoon. Sleeping well at night. Eats well if she has  something she likes.   REVIEW OF SYSTEMS: Out of a complete 14 system review of symptoms, the patient complains only of the following symptoms, and all other reviewed systems are negative.  See HPI  ALLERGIES: Allergies  Allergen Reactions   Cefoxitin    Ceftin [Cefuroxime Axetil] Other (See Comments)    Seeing lights   Codeine Other (See Comments)    Unknown    HOME MEDICATIONS: Outpatient Medications Prior to Visit  Medication Sig Dispense Refill   amLODipine (NORVASC) 5 MG tablet Take 5 mg by mouth at bedtime.      aspirin EC 81 MG tablet Take 81 mg by mouth daily. Swallow whole.     benazepril (LOTENSIN) 40 MG tablet Take 40 mg by mouth at bedtime.      lidocaine  (LIDODERM ) 5 % Place 1 patch onto the skin daily. Remove & Discard patch within 12 hours or as directed by MD 15 patch 0   meclizine  (ANTIVERT ) 12.5 MG tablet Take 1 tablet (12.5 mg total) by mouth 2 (two) times daily as needed for dizziness. 10 tablet 0   metoprolol succinate (TOPROL-XL) 100 MG 24 hr tablet Take 100 mg by mouth daily. Take with or immediately following a meal.     atorvastatin (LIPITOR) 10 MG tablet Take 10 mg by mouth daily. (Patient not taking: Reported on 03/28/2023)     No facility-administered medications prior to visit.    PAST MEDICAL HISTORY: Past Medical History:  Diagnosis Date   Hypertension    Stroke (HCC)    Vertigo     PAST SURGICAL HISTORY: Past Surgical History:  Procedure Laterality Date   PARTIAL HYSTERECTOMY      FAMILY HISTORY: Family History  Adopted: Yes  Problem Relation Age of Onset   Other Mother        unsure of history   Other Father        unsure of history    SOCIAL HISTORY: Social History   Socioeconomic History   Marital status: Widowed    Spouse name: Not on file   Number of children: 6   Years of education: 10th grade   Highest education level: Not on file  Occupational History   Occupation: Retired  Tobacco Use   Smoking status: Never    Smokeless tobacco: Never  Vaping Use   Vaping status: Never Used  Substance and Sexual Activity   Alcohol use: No   Drug use: Never   Sexual activity: Never  Other Topics Concern   Not on file  Social History Narrative   Right-handed.   No daily use of caffeine.   She lives at home with three of her children (she has four living).   Social Drivers of Corporate investment banker Strain: Not on file  Food Insecurity: Not on file  Transportation Needs: Not on file  Physical Activity: Not on file  Stress: Not on file  Social Connections: Unknown (02/12/2022)  Received from [facility]   Social Network    Social Network: Not on file  Intimate Partner Violence: Unknown (01/04/2022)   Received from [facility]   HITS    Physically Hurt: Not on file    Insult or Talk Down To: Not on file    Threaten Physical Harm: Not on file    Scream or Curse: Not on file   PHYSICAL EXAM  Vitals:   03/15/24 0850  BP: 122/77  Pulse: 64  Resp: 15  SpO2: 91%  Height: 5' 1 (1.549 m)    Body mass index is 23.41 kg/m.  Generalized: Well developed, in no acute distress, seated in wheelchair Neurological examination  Mentation: Alert oriented to time, place, history is provided by patient and her daughter, both are very pleasant. Follows all commands speech and language fluent. Very attentive and contributory during visit.  Cranial nerve II-XII: Pupils were equal round reactive to light. Extraocular movements were full, had hard time with # naming. Facial sensation and strength were normal. Head turning and shoulder shrug  were normal and symmetric. Motor: The motor testing reveals 5 over 5 strength of all 4 extremities. Good symmetric motor tone is noted throughout.  Sensory: Sensory testing is intact to soft touch on all 4 extremities. No evidence of extinction is noted.  Coordination: had harder time with FTN, touching my thumb instead of pointer finger Gait and station: Has to push off  from seated position in wheelchair, gait is wide based, cautious, able to walk with my assistance   DIAGNOSTIC DATA (LABS, IMAGING, TESTING) - I reviewed patient records, labs, notes, testing and imaging myself where available.  Lab Results  Component Value Date   WBC 6.6 08/03/2023   HGB 12.5 08/03/2023   HCT 37.8 08/03/2023   MCV 93.6 08/03/2023   PLT 563 (H) 08/03/2023      Component Value Date/Time   NA 139 08/03/2023 0928   K 4.2 08/03/2023 0928   CL 102 08/03/2023 0928   CO2 30 08/03/2023 0928   GLUCOSE 91 08/03/2023 0928   BUN 11 08/03/2023 0928   CREATININE 0.78 08/03/2023 0928   CALCIUM 9.3 08/03/2023 0928   PROT 7.4 08/03/2023 0928   ALBUMIN 4.3 08/03/2023 0928   AST 15 08/03/2023 0928   ALT 8 08/03/2023 0928   ALKPHOS 46 08/03/2023 0928   BILITOT 0.4 08/03/2023 0928   GFRNONAA >60 08/03/2023 0928   GFRAA >60 06/07/2017 1422   No results found for: CHOL, HDL, LDLCALC, LDLDIRECT, TRIG, CHOLHDL Lab Results  Component Value Date   HGBA1C 6.0 (H) 11/29/2021   Lab Results  Component Value Date   VITAMINB12 265 11/29/2021   Lab Results  Component Value Date   TSH 0.313 (L) 11/29/2021    Jeanmarie Millet, AGNP-C, DNP 03/15/2024, 9:16 AM [facility] [address] [place], [place] [address] [phone]

## 2024-03-15 ENCOUNTER — Encounter: Payer: Self-pay | Admitting: Neurology

## 2024-03-15 ENCOUNTER — Ambulatory Visit (INDEPENDENT_AMBULATORY_CARE_PROVIDER_SITE_OTHER): Admitting: Neurology

## 2024-03-15 VITALS — BP 122/77 | HR 64 | Resp 15 | Ht 61.0 in

## 2024-03-15 DIAGNOSIS — R29898 Other symptoms and signs involving the musculoskeletal system: Secondary | ICD-10-CM | POA: Diagnosis not present

## 2024-03-15 DIAGNOSIS — I639 Cerebral infarction, unspecified: Secondary | ICD-10-CM

## 2024-03-15 DIAGNOSIS — G309 Alzheimer's disease, unspecified: Secondary | ICD-10-CM

## 2024-03-15 DIAGNOSIS — F02B0 Dementia in other diseases classified elsewhere, moderate, without behavioral disturbance, psychotic disturbance, mood disturbance, and anxiety: Secondary | ICD-10-CM

## 2024-03-15 DIAGNOSIS — Z8673 Personal history of transient ischemic attack (TIA), and cerebral infarction without residual deficits: Secondary | ICD-10-CM | POA: Diagnosis not present

## 2024-03-15 NOTE — Patient Instructions (Signed)
 Great to meet you today.  Continue to document any seizure-like spells and let me know. For memory recommend, brain stimulating activity, exercise, healthy eating, drinking plenty of water.  Keep close follow-up with primary care for management of vascular risk factors for secondary stroke prevention.  Follow-up in 1 year or sooner if needed.  Thanks!!

## 2024-03-16 ENCOUNTER — Ambulatory Visit: Payer: Medicare Other | Admitting: Neurology

## 2024-03-25 ENCOUNTER — Ambulatory Visit: Admitting: Neurology

## 2024-03-31 DIAGNOSIS — H04123 Dry eye syndrome of bilateral lacrimal glands: Secondary | ICD-10-CM | POA: Diagnosis not present

## 2024-03-31 DIAGNOSIS — H35372 Puckering of macula, left eye: Secondary | ICD-10-CM | POA: Diagnosis not present

## 2024-03-31 DIAGNOSIS — H35033 Hypertensive retinopathy, bilateral: Secondary | ICD-10-CM | POA: Diagnosis not present

## 2024-03-31 DIAGNOSIS — H353132 Nonexudative age-related macular degeneration, bilateral, intermediate dry stage: Secondary | ICD-10-CM | POA: Diagnosis not present

## 2024-05-14 DIAGNOSIS — M170 Bilateral primary osteoarthritis of knee: Secondary | ICD-10-CM | POA: Diagnosis not present

## 2024-05-26 DIAGNOSIS — I10 Essential (primary) hypertension: Secondary | ICD-10-CM | POA: Diagnosis not present

## 2024-05-26 DIAGNOSIS — E782 Mixed hyperlipidemia: Secondary | ICD-10-CM | POA: Diagnosis not present

## 2024-05-26 DIAGNOSIS — D473 Essential (hemorrhagic) thrombocythemia: Secondary | ICD-10-CM | POA: Diagnosis not present

## 2024-05-26 DIAGNOSIS — I447 Left bundle-branch block, unspecified: Secondary | ICD-10-CM | POA: Diagnosis not present

## 2024-05-26 DIAGNOSIS — R5383 Other fatigue: Secondary | ICD-10-CM | POA: Diagnosis not present

## 2024-05-26 DIAGNOSIS — I471 Supraventricular tachycardia, unspecified: Secondary | ICD-10-CM | POA: Diagnosis not present

## 2024-05-26 DIAGNOSIS — R569 Unspecified convulsions: Secondary | ICD-10-CM | POA: Diagnosis not present

## 2024-05-26 DIAGNOSIS — R7303 Prediabetes: Secondary | ICD-10-CM | POA: Diagnosis not present

## 2024-05-26 DIAGNOSIS — I639 Cerebral infarction, unspecified: Secondary | ICD-10-CM | POA: Diagnosis not present

## 2024-05-26 DIAGNOSIS — H5340 Unspecified visual field defects: Secondary | ICD-10-CM | POA: Diagnosis not present

## 2024-05-26 DIAGNOSIS — N182 Chronic kidney disease, stage 2 (mild): Secondary | ICD-10-CM | POA: Diagnosis not present

## 2024-06-18 DIAGNOSIS — M25562 Pain in left knee: Secondary | ICD-10-CM | POA: Diagnosis not present

## 2024-06-22 DIAGNOSIS — R569 Unspecified convulsions: Secondary | ICD-10-CM | POA: Diagnosis not present

## 2024-06-22 DIAGNOSIS — Z0000 Encounter for general adult medical examination without abnormal findings: Secondary | ICD-10-CM | POA: Diagnosis not present

## 2024-06-22 DIAGNOSIS — D473 Essential (hemorrhagic) thrombocythemia: Secondary | ICD-10-CM | POA: Diagnosis not present

## 2024-06-22 DIAGNOSIS — R441 Visual hallucinations: Secondary | ICD-10-CM | POA: Diagnosis not present

## 2024-06-22 DIAGNOSIS — I10 Essential (primary) hypertension: Secondary | ICD-10-CM | POA: Diagnosis not present

## 2024-06-22 DIAGNOSIS — I639 Cerebral infarction, unspecified: Secondary | ICD-10-CM | POA: Diagnosis not present

## 2024-06-22 DIAGNOSIS — N182 Chronic kidney disease, stage 2 (mild): Secondary | ICD-10-CM | POA: Diagnosis not present

## 2024-06-22 DIAGNOSIS — I447 Left bundle-branch block, unspecified: Secondary | ICD-10-CM | POA: Diagnosis not present

## 2024-06-22 DIAGNOSIS — F03C0 Unspecified dementia, severe, without behavioral disturbance, psychotic disturbance, mood disturbance, and anxiety: Secondary | ICD-10-CM | POA: Diagnosis not present

## 2024-06-22 DIAGNOSIS — I471 Supraventricular tachycardia, unspecified: Secondary | ICD-10-CM | POA: Diagnosis not present

## 2024-06-22 DIAGNOSIS — R7303 Prediabetes: Secondary | ICD-10-CM | POA: Diagnosis not present

## 2024-06-22 DIAGNOSIS — E782 Mixed hyperlipidemia: Secondary | ICD-10-CM | POA: Diagnosis not present

## 2024-07-02 DIAGNOSIS — M170 Bilateral primary osteoarthritis of knee: Secondary | ICD-10-CM | POA: Diagnosis not present

## 2024-07-05 DIAGNOSIS — H04123 Dry eye syndrome of bilateral lacrimal glands: Secondary | ICD-10-CM | POA: Diagnosis not present

## 2024-07-05 DIAGNOSIS — H353132 Nonexudative age-related macular degeneration, bilateral, intermediate dry stage: Secondary | ICD-10-CM | POA: Diagnosis not present

## 2024-07-26 DIAGNOSIS — M25572 Pain in left ankle and joints of left foot: Secondary | ICD-10-CM | POA: Diagnosis not present

## 2024-07-26 DIAGNOSIS — M170 Bilateral primary osteoarthritis of knee: Secondary | ICD-10-CM | POA: Diagnosis not present

## 2024-07-28 ENCOUNTER — Inpatient Hospital Stay (HOSPITAL_COMMUNITY)

## 2024-07-28 ENCOUNTER — Emergency Department (HOSPITAL_COMMUNITY)

## 2024-07-28 ENCOUNTER — Other Ambulatory Visit: Payer: Self-pay

## 2024-07-28 ENCOUNTER — Inpatient Hospital Stay (HOSPITAL_COMMUNITY)
Admission: EM | Admit: 2024-07-28 | Discharge: 2024-08-05 | DRG: 064 | Disposition: A | Discharge status: Skilled Nursing Facility | Attending: Internal Medicine | Admitting: Internal Medicine

## 2024-07-28 DIAGNOSIS — R7303 Prediabetes: Secondary | ICD-10-CM | POA: Diagnosis present

## 2024-07-28 DIAGNOSIS — G8321 Monoplegia of upper limb affecting right dominant side: Secondary | ICD-10-CM | POA: Diagnosis present

## 2024-07-28 DIAGNOSIS — Z881 Allergy status to other antibiotic agents status: Secondary | ICD-10-CM

## 2024-07-28 DIAGNOSIS — I2693 Single subsegmental pulmonary embolism without acute cor pulmonale: Secondary | ICD-10-CM | POA: Diagnosis not present

## 2024-07-28 DIAGNOSIS — N390 Urinary tract infection, site not specified: Secondary | ICD-10-CM | POA: Diagnosis present

## 2024-07-28 DIAGNOSIS — Z8669 Personal history of other diseases of the nervous system and sense organs: Secondary | ICD-10-CM | POA: Diagnosis not present

## 2024-07-28 DIAGNOSIS — E222 Syndrome of inappropriate secretion of antidiuretic hormone: Secondary | ICD-10-CM | POA: Diagnosis present

## 2024-07-28 DIAGNOSIS — F03C2 Unspecified dementia, severe, with psychotic disturbance: Secondary | ICD-10-CM | POA: Diagnosis present

## 2024-07-28 DIAGNOSIS — G319 Degenerative disease of nervous system, unspecified: Secondary | ICD-10-CM | POA: Diagnosis not present

## 2024-07-28 DIAGNOSIS — I63433 Cerebral infarction due to embolism of bilateral posterior cerebral arteries: Principal | ICD-10-CM | POA: Diagnosis present

## 2024-07-28 DIAGNOSIS — L89156 Pressure-induced deep tissue damage of sacral region: Secondary | ICD-10-CM | POA: Diagnosis not present

## 2024-07-28 DIAGNOSIS — Z7189 Other specified counseling: Secondary | ICD-10-CM | POA: Diagnosis not present

## 2024-07-28 DIAGNOSIS — H539 Unspecified visual disturbance: Secondary | ICD-10-CM | POA: Diagnosis not present

## 2024-07-28 DIAGNOSIS — M5030 Other cervical disc degeneration, unspecified cervical region: Secondary | ICD-10-CM | POA: Diagnosis not present

## 2024-07-28 DIAGNOSIS — I6389 Other cerebral infarction: Secondary | ICD-10-CM | POA: Diagnosis not present

## 2024-07-28 DIAGNOSIS — I10 Essential (primary) hypertension: Secondary | ICD-10-CM | POA: Diagnosis not present

## 2024-07-28 DIAGNOSIS — Z7982 Long term (current) use of aspirin: Secondary | ICD-10-CM

## 2024-07-28 DIAGNOSIS — R471 Dysarthria and anarthria: Secondary | ICD-10-CM | POA: Diagnosis present

## 2024-07-28 DIAGNOSIS — I6523 Occlusion and stenosis of bilateral carotid arteries: Secondary | ICD-10-CM | POA: Diagnosis not present

## 2024-07-28 DIAGNOSIS — R4182 Altered mental status, unspecified: Secondary | ICD-10-CM | POA: Diagnosis not present

## 2024-07-28 DIAGNOSIS — I4892 Unspecified atrial flutter: Secondary | ICD-10-CM | POA: Diagnosis not present

## 2024-07-28 DIAGNOSIS — R29818 Other symptoms and signs involving the nervous system: Secondary | ICD-10-CM | POA: Diagnosis not present

## 2024-07-28 DIAGNOSIS — I6782 Cerebral ischemia: Secondary | ICD-10-CM | POA: Diagnosis not present

## 2024-07-28 DIAGNOSIS — Z8673 Personal history of transient ischemic attack (TIA), and cerebral infarction without residual deficits: Secondary | ICD-10-CM

## 2024-07-28 DIAGNOSIS — Z043 Encounter for examination and observation following other accident: Secondary | ICD-10-CM | POA: Diagnosis not present

## 2024-07-28 DIAGNOSIS — E8721 Acute metabolic acidosis: Secondary | ICD-10-CM | POA: Diagnosis present

## 2024-07-28 DIAGNOSIS — R531 Weakness: Secondary | ICD-10-CM | POA: Diagnosis not present

## 2024-07-28 DIAGNOSIS — R1310 Dysphagia, unspecified: Secondary | ICD-10-CM | POA: Diagnosis present

## 2024-07-28 DIAGNOSIS — I480 Paroxysmal atrial fibrillation: Secondary | ICD-10-CM | POA: Diagnosis not present

## 2024-07-28 DIAGNOSIS — K449 Diaphragmatic hernia without obstruction or gangrene: Secondary | ICD-10-CM | POA: Diagnosis not present

## 2024-07-28 DIAGNOSIS — R29722 NIHSS score 22: Secondary | ICD-10-CM | POA: Diagnosis not present

## 2024-07-28 DIAGNOSIS — I700 Atherosclerosis of aorta: Secondary | ICD-10-CM | POA: Diagnosis not present

## 2024-07-28 DIAGNOSIS — F0390 Unspecified dementia without behavioral disturbance: Secondary | ICD-10-CM | POA: Diagnosis not present

## 2024-07-28 DIAGNOSIS — I6340 Cerebral infarction due to embolism of unspecified cerebral artery: Secondary | ICD-10-CM | POA: Diagnosis not present

## 2024-07-28 DIAGNOSIS — I2699 Other pulmonary embolism without acute cor pulmonale: Secondary | ICD-10-CM | POA: Diagnosis not present

## 2024-07-28 DIAGNOSIS — E782 Mixed hyperlipidemia: Secondary | ICD-10-CM | POA: Diagnosis not present

## 2024-07-28 DIAGNOSIS — H547 Unspecified visual loss: Secondary | ICD-10-CM | POA: Diagnosis present

## 2024-07-28 DIAGNOSIS — I959 Hypotension, unspecified: Secondary | ICD-10-CM | POA: Diagnosis not present

## 2024-07-28 DIAGNOSIS — E538 Deficiency of other specified B group vitamins: Secondary | ICD-10-CM | POA: Diagnosis present

## 2024-07-28 DIAGNOSIS — E871 Hypo-osmolality and hyponatremia: Secondary | ICD-10-CM | POA: Diagnosis present

## 2024-07-28 DIAGNOSIS — E785 Hyperlipidemia, unspecified: Secondary | ICD-10-CM | POA: Diagnosis present

## 2024-07-28 DIAGNOSIS — F0392 Unspecified dementia, unspecified severity, with psychotic disturbance: Secondary | ICD-10-CM | POA: Diagnosis not present

## 2024-07-28 DIAGNOSIS — Z7401 Bed confinement status: Secondary | ICD-10-CM | POA: Diagnosis not present

## 2024-07-28 DIAGNOSIS — G9341 Metabolic encephalopathy: Secondary | ICD-10-CM | POA: Diagnosis not present

## 2024-07-28 DIAGNOSIS — F03C0 Unspecified dementia, severe, without behavioral disturbance, psychotic disturbance, mood disturbance, and anxiety: Secondary | ICD-10-CM | POA: Diagnosis not present

## 2024-07-28 DIAGNOSIS — E860 Dehydration: Secondary | ICD-10-CM | POA: Diagnosis present

## 2024-07-28 DIAGNOSIS — Z79899 Other long term (current) drug therapy: Secondary | ICD-10-CM | POA: Diagnosis not present

## 2024-07-28 DIAGNOSIS — M8580 Other specified disorders of bone density and structure, unspecified site: Secondary | ICD-10-CM | POA: Diagnosis not present

## 2024-07-28 DIAGNOSIS — M79642 Pain in left hand: Secondary | ICD-10-CM | POA: Diagnosis not present

## 2024-07-28 DIAGNOSIS — Z7901 Long term (current) use of anticoagulants: Secondary | ICD-10-CM | POA: Diagnosis not present

## 2024-07-28 DIAGNOSIS — D519 Vitamin B12 deficiency anemia, unspecified: Secondary | ICD-10-CM | POA: Diagnosis not present

## 2024-07-28 DIAGNOSIS — I130 Hypertensive heart and chronic kidney disease with heart failure and stage 1 through stage 4 chronic kidney disease, or unspecified chronic kidney disease: Secondary | ICD-10-CM | POA: Diagnosis present

## 2024-07-28 DIAGNOSIS — R1319 Other dysphagia: Secondary | ICD-10-CM | POA: Diagnosis not present

## 2024-07-28 DIAGNOSIS — W19XXXA Unspecified fall, initial encounter: Secondary | ICD-10-CM | POA: Diagnosis not present

## 2024-07-28 DIAGNOSIS — G238 Other specified degenerative diseases of basal ganglia: Secondary | ICD-10-CM | POA: Diagnosis not present

## 2024-07-28 DIAGNOSIS — I471 Supraventricular tachycardia, unspecified: Secondary | ICD-10-CM | POA: Diagnosis not present

## 2024-07-28 DIAGNOSIS — K219 Gastro-esophageal reflux disease without esophagitis: Secondary | ICD-10-CM | POA: Diagnosis not present

## 2024-07-28 DIAGNOSIS — I739 Peripheral vascular disease, unspecified: Secondary | ICD-10-CM | POA: Diagnosis not present

## 2024-07-28 DIAGNOSIS — M6281 Muscle weakness (generalized): Secondary | ICD-10-CM | POA: Diagnosis not present

## 2024-07-28 DIAGNOSIS — N182 Chronic kidney disease, stage 2 (mild): Secondary | ICD-10-CM | POA: Diagnosis present

## 2024-07-28 DIAGNOSIS — I4891 Unspecified atrial fibrillation: Secondary | ICD-10-CM | POA: Diagnosis not present

## 2024-07-28 DIAGNOSIS — I69391 Dysphagia following cerebral infarction: Secondary | ICD-10-CM | POA: Diagnosis not present

## 2024-07-28 DIAGNOSIS — G9389 Other specified disorders of brain: Secondary | ICD-10-CM | POA: Diagnosis not present

## 2024-07-28 DIAGNOSIS — R29898 Other symptoms and signs involving the musculoskeletal system: Secondary | ICD-10-CM | POA: Diagnosis not present

## 2024-07-28 DIAGNOSIS — I771 Stricture of artery: Secondary | ICD-10-CM | POA: Diagnosis not present

## 2024-07-28 DIAGNOSIS — R41841 Cognitive communication deficit: Secondary | ICD-10-CM | POA: Diagnosis not present

## 2024-07-28 DIAGNOSIS — R569 Unspecified convulsions: Secondary | ICD-10-CM | POA: Diagnosis not present

## 2024-07-28 DIAGNOSIS — Z555 Less than a high school diploma: Secondary | ICD-10-CM

## 2024-07-28 DIAGNOSIS — I639 Cerebral infarction, unspecified: Secondary | ICD-10-CM

## 2024-07-28 DIAGNOSIS — M19042 Primary osteoarthritis, left hand: Secondary | ICD-10-CM | POA: Diagnosis not present

## 2024-07-28 DIAGNOSIS — R410 Disorientation, unspecified: Secondary | ICD-10-CM | POA: Diagnosis not present

## 2024-07-28 DIAGNOSIS — I517 Cardiomegaly: Secondary | ICD-10-CM | POA: Diagnosis not present

## 2024-07-28 DIAGNOSIS — H5340 Unspecified visual field defects: Secondary | ICD-10-CM | POA: Diagnosis not present

## 2024-07-28 DIAGNOSIS — I69354 Hemiplegia and hemiparesis following cerebral infarction affecting left non-dominant side: Secondary | ICD-10-CM | POA: Diagnosis not present

## 2024-07-28 DIAGNOSIS — M47812 Spondylosis without myelopathy or radiculopathy, cervical region: Secondary | ICD-10-CM | POA: Diagnosis not present

## 2024-07-28 DIAGNOSIS — Z515 Encounter for palliative care: Secondary | ICD-10-CM

## 2024-07-28 DIAGNOSIS — D473 Essential (hemorrhagic) thrombocythemia: Secondary | ICD-10-CM | POA: Diagnosis not present

## 2024-07-28 DIAGNOSIS — I5022 Chronic systolic (congestive) heart failure: Secondary | ICD-10-CM | POA: Diagnosis not present

## 2024-07-28 DIAGNOSIS — Z885 Allergy status to narcotic agent status: Secondary | ICD-10-CM

## 2024-07-28 DIAGNOSIS — R2689 Other abnormalities of gait and mobility: Secondary | ICD-10-CM | POA: Diagnosis not present

## 2024-07-28 LAB — COMPREHENSIVE METABOLIC PANEL WITH GFR
ALT: 13 U/L (ref 0–44)
AST: 22 U/L (ref 15–41)
Albumin: 3 g/dL — ABNORMAL LOW (ref 3.5–5.0)
Alkaline Phosphatase: 55 U/L (ref 38–126)
Anion gap: 15 (ref 5–15)
BUN: 22 mg/dL (ref 8–23)
CO2: 18 mmol/L — ABNORMAL LOW (ref 22–32)
Calcium: 8.4 mg/dL — ABNORMAL LOW (ref 8.9–10.3)
Chloride: 86 mmol/L — ABNORMAL LOW (ref 98–111)
Creatinine, Ser: 0.86 mg/dL (ref 0.44–1.00)
GFR, Estimated: >60 mL/min (ref >60)
Glucose, Bld: 111 mg/dL — ABNORMAL HIGH (ref 70–99)
Potassium: 4.8 mmol/L (ref 3.5–5.1)
Sodium: 119 mmol/L — CL (ref 135–145)
Total Bilirubin: 0.8 mg/dL (ref 0.0–1.2)
Total Protein: 6.7 g/dL (ref 6.5–8.1)

## 2024-07-28 LAB — I-STAT CHEM 8, ED
BUN: 25 mg/dL — ABNORMAL HIGH (ref 8–23)
Calcium, Ion: 0.95 mmol/L — ABNORMAL LOW (ref 1.15–1.40)
Chloride: 87 mmol/L — ABNORMAL LOW (ref 98–111)
Creatinine, Ser: 1 mg/dL (ref 0.44–1.00)
Glucose, Bld: 111 mg/dL — ABNORMAL HIGH (ref 70–99)
HCT: 34 % — ABNORMAL LOW (ref 36.0–46.0)
Hemoglobin: 11.6 g/dL — ABNORMAL LOW (ref 12.0–15.0)
Potassium: 4.6 mmol/L (ref 3.5–5.1)
Sodium: 118 mmol/L — CL (ref 135–145)
TCO2: 22 mmol/L (ref 22–32)

## 2024-07-28 LAB — DIFFERENTIAL
Abs Immature Granulocytes: 0.07 K/uL (ref 0.00–0.07)
Basophils Absolute: 0.1 K/uL (ref 0.0–0.1)
Basophils Relative: 1 %
Eosinophils Absolute: 0.1 K/uL (ref 0.0–0.5)
Eosinophils Relative: 1 %
Immature Granulocytes: 1 %
Lymphocytes Relative: 17 %
Lymphs Abs: 1.7 K/uL (ref 0.7–4.0)
Monocytes Absolute: 1.3 K/uL — ABNORMAL HIGH (ref 0.1–1.0)
Monocytes Relative: 12 %
Neutro Abs: 7.1 K/uL (ref 1.7–7.7)
Neutrophils Relative %: 68 %

## 2024-07-28 LAB — CBC
HCT: 31.6 % — ABNORMAL LOW (ref 36.0–46.0)
Hemoglobin: 11.2 g/dL — ABNORMAL LOW (ref 12.0–15.0)
MCH: 31.3 pg (ref 26.0–34.0)
MCHC: 35.4 g/dL (ref 30.0–36.0)
MCV: 88.3 fL (ref 80.0–100.0)
Platelets: 543 K/uL — ABNORMAL HIGH (ref 150–400)
RBC: 3.58 MIL/uL — ABNORMAL LOW (ref 3.87–5.11)
RDW: 16.7 % — ABNORMAL HIGH (ref 11.5–15.5)
WBC: 10.3 K/uL (ref 4.0–10.5)
nRBC: 0 % (ref 0.0–0.2)

## 2024-07-28 LAB — GLUCOSE, CAPILLARY
Glucose-Capillary: 109 mg/dL — ABNORMAL HIGH (ref 70–99)
Glucose-Capillary: 111 mg/dL — ABNORMAL HIGH (ref 70–99)

## 2024-07-28 LAB — MRSA NEXT GEN BY PCR, NASAL: MRSA by PCR Next Gen: NOT DETECTED

## 2024-07-28 LAB — CBG MONITORING, ED
Glucose-Capillary: 98 mg/dL (ref 70–99)
Glucose-Capillary: 117 mg/dL — ABNORMAL HIGH (ref 70–99)

## 2024-07-28 LAB — PROTIME-INR
INR: 1.1 (ref 0.8–1.2)
Prothrombin Time: 14.6 s (ref 11.4–15.2)

## 2024-07-28 LAB — ETHANOL: Alcohol, Ethyl (B): <15 mg/dL (ref <15)

## 2024-07-28 LAB — TSH: TSH: 0.472 u[IU]/mL (ref 0.350–4.500)

## 2024-07-28 LAB — CORTISOL: Cortisol, Plasma: 19.3 ug/dL

## 2024-07-28 LAB — OSMOLALITY: Osmolality: 252 mosm/kg — ABNORMAL LOW (ref 275–295)

## 2024-07-28 LAB — SODIUM
Sodium: 119 mmol/L — CL (ref 135–145)
Sodium: 169 mmol/L (ref 135–145)
Sodium: 124 mmol/L — ABNORMAL LOW (ref 135–145)

## 2024-07-28 LAB — APTT: aPTT: 36 s (ref 24–36)

## 2024-07-28 MED ORDER — LORAZEPAM 1 MG PO TABS
0.5000 mg | ORAL_TABLET | ORAL | Status: AC | PRN
  Administered 2024-07-28: 0.5 mg via ORAL
  Filled 2024-07-28: qty 1

## 2024-07-28 MED ORDER — METOPROLOL TARTRATE 50 MG PO TABS
50.0000 mg | ORAL_TABLET | Freq: Two times a day (BID) | ORAL | Status: DC
  Filled 2024-07-28: qty 1

## 2024-07-28 MED ORDER — SODIUM CHLORIDE 3 % IV SOLN
INTRAVENOUS | Status: DC
  Filled 2024-07-28 (×2): qty 500

## 2024-07-28 MED ORDER — SODIUM CHLORIDE 0.9 % IV SOLN
1.0000 g | INTRAVENOUS | Status: DC
  Administered 2024-07-28: 1 g via INTRAVENOUS
  Filled 2024-07-28: qty 10

## 2024-07-28 MED ORDER — SODIUM CHLORIDE 0.9 % IV SOLN: INTRAVENOUS | Status: AC

## 2024-07-28 MED ORDER — DOCUSATE SODIUM 100 MG PO CAPS: 100.0000 mg | ORAL_CAPSULE | Freq: Two times a day (BID) | ORAL | Status: DC | PRN

## 2024-07-28 MED ORDER — INSULIN ASPART 100 UNIT/ML IJ SOLN: 1.0000 [IU] | INTRAMUSCULAR | Status: DC

## 2024-07-28 MED ORDER — LORAZEPAM 1 MG PO TABS: 0.5000 mg | ORAL_TABLET | Freq: Once | ORAL | Status: DC

## 2024-07-28 MED ORDER — ORAL CARE MOUTH RINSE: 15.0000 mL | OROMUCOSAL | Status: DC | PRN

## 2024-07-28 MED ORDER — CHLORHEXIDINE GLUCONATE CLOTH 2 % EX PADS
6.0000 | MEDICATED_PAD | Freq: Every day | CUTANEOUS | Status: DC
  Administered 2024-07-28 – 2024-08-04 (×6): 6 via TOPICAL

## 2024-07-28 MED ORDER — POLYETHYLENE GLYCOL 3350 17 G PO PACK
17.0000 g | PACK | Freq: Every day | ORAL | Status: DC | PRN
Start: 2024-07-28 — End: 2024-08-05

## 2024-07-28 MED ORDER — LORAZEPAM 2 MG/ML IJ SOLN: 0.5000 mg | Freq: Once | INTRAMUSCULAR | Status: DC

## 2024-07-28 MED ORDER — HEPARIN SODIUM (PORCINE) 5000 UNIT/ML IJ SOLN
5000.0000 [IU] | Freq: Three times a day (TID) | INTRAMUSCULAR | Status: DC
  Administered 2024-07-28 – 2024-08-02 (×13): 5000 [IU] via SUBCUTANEOUS
  Filled 2024-07-28 (×14): qty 1

## 2024-07-28 MED ORDER — SODIUM CHLORIDE 0.9% FLUSH
3.0000 mL | Freq: Once | INTRAVENOUS | Status: AC
  Administered 2024-07-28: 3 mL via INTRAVENOUS

## 2024-07-28 NOTE — ED Notes (Signed)
 Report given to [facility] in ICU. ED float RN transporting pt to IP unit

## 2024-07-28 NOTE — ED Notes (Signed)
 Per MRI, pt to be medicated with PRN PO ativan  at 2000 for 2030 scan

## 2024-07-28 NOTE — ED Provider Notes (Addendum)
 [place] EMERGENCY DEPARTMENT AT [facility] Provider Note   CSN: [id number] Arrival date & time: 07/28/24  1506     Patient presents with: Weakness   Danielle Vaughan is a 89 y.o. female.    Weakness    89 year old female with medical history significant for prior CVA, SVT, frequent PVCs, partial seizures, memory loss, dementia (baseline alert and oriented to name and place) who presents to the emergency department with confusion and concern for strokelike symptoms.  Patient is coming from her doctor's office called out for strokelike symptoms.  Confused at baseline but having worsening confusion over the past week.  Had a fall 9 days ago and has been more weak generalized having difficulty walking without a walker since.  Symptoms been ongoing for the past 9 days but over the past few days family noticed worsening left-sided weakness.  She is also currently taking fluconazole for yeast infection. Family reports no seizure like activity.  Prior to Admission medications   Medication Sig Start Date End Date Taking? Authorizing Provider  acetaminophen  (TYLENOL ) 500 MG tablet Take 500-1,000 mg by mouth every 6 (six) hours as needed for moderate pain (pain score 4-6).   Yes [provider]  amLODipine (NORVASC) 5 MG tablet Take 5 mg by mouth at bedtime.    Yes [provider]  benazepril (LOTENSIN) 40 MG tablet Take 40 mg by mouth at bedtime.    Yes [provider]  Camphor-Menthol-Methyl Sal (SALONPAS EX) Apply 1 Application topically daily as needed (Pain).   Yes [provider]  fluconazole (DIFLUCAN) 150 MG tablet Take 150 mg by mouth daily. 07/26/24  Yes [provider]  ibuprofen (ADVIL) 200 MG tablet Take 200-800 mg by mouth every 6 (six) hours as needed for moderate pain (pain score 4-6).   Yes [provider]  ketoconazole (NIZORAL) 2 % cream Apply 1 Application topically 2 (two) times daily. 07/26/24  Yes [provider]  Menthol, Topical Analgesic, (BIOFREEZE EX) Apply 1 Application topically as needed (Pain).   Yes [provider]  Menthol, Topical Analgesic, (ICY HOT ADVANCED RELIEF EX) Apply 1 Application topically as needed (Pain).   Yes [provider]  metoprolol succinate (TOPROL-XL) 100 MG 24 hr tablet Take 100 mg by mouth daily. Take with or immediately following a meal.   Yes [provider]  Polyethyl Glycol-Propyl Glycol (SYSTANE FREE OP) Place 1 drop into both eyes at bedtime.   Yes [provider]    Allergies: Cefoxitin, Ceftin [cefuroxime axetil], and Codeine    Review of Systems  Neurological:  Positive for weakness.  All other systems reviewed and are negative.   Updated Vital Signs BP 104/87   Pulse 75   Temp 98 F (36.7 C) (Oral)   Resp 18   Ht 5' 1 (1.549 m)   Wt 56.2 kg   SpO2 98%   BMI 23.41 kg/m   Physical Exam Vitals and nursing note reviewed.  Constitutional:      General: She is not in acute distress.    Appearance: She is well-developed.  HENT:     Head: Normocephalic and atraumatic.  Eyes:     Conjunctiva/sclera: Conjunctivae normal.  Neck:     Comments: No midline TTP of the cervical spine Cardiovascular:     Rate and Rhythm: Normal rate and regular rhythm.     Heart sounds: No murmur heard. Pulmonary:     Effort: Pulmonary effort is normal. No respiratory distress.  Breath sounds: Normal breath sounds.  Abdominal:     Palpations: Abdomen is soft.     Tenderness: There is no abdominal tenderness.  Musculoskeletal:        General: No swelling.     Cervical back: Neck supple.  Skin:    General: Skin is warm and dry.     Capillary Refill: Capillary refill takes less than 2 seconds.  Neurological:     Mental Status: She is alert.     Comments: MENTAL STATUS EXAM:    Orientation: Alert and oriented to person and place, disoriented to year. Memory: Cooperative, however, has difficulty following  commands Language: Speech is clear and language is normal.   CRANIAL NERVES:    CN 2 (Optic): Visual fields intact to confrontation.  CN 3,4,6 (EOM): Pupils equal and reactive to light. Full extraocular eye movement difficult to fully assess, pt would not fully cooperate with exam CN 5 (Trigeminal): Facial sensation is normal, no weakness of masticatory muscles.  CN 7 (Facial): No facial weakness or asymmetry.  CN 8 (Auditory): Auditory acuity grossly normal.  CN 9,10 (Glossophar): The uvula is midline, the palate elevates symmetrically.  CN 11 (spinal access): Normal sternocleidomastoid and trapezius strength.  CN 12 (Hypoglossal): The tongue is midline. No atrophy or fasciculations.SABRA   MOTOR:  Muscle Strength: 5/5RUE, 4/5LUE, pt would not cooperate to lift lower extremities. Not fully following commands   COORDINATION: No tremor  SENSATION:   Intact to light touch all four extremities.  GAIT: Gait not assessed   Psychiatric:        Mood and Affect: Mood normal.     (all labs ordered are listed, but only abnormal results are displayed) Labs Reviewed  CBC - Abnormal; Notable for the following components:      Result Value   RBC 3.58 (*)    Hemoglobin 11.2 (*)    HCT 31.6 (*)    RDW 16.7 (*)    Platelets 543 (*)    All other components within normal limits  DIFFERENTIAL - Abnormal; Notable for the following components:   Monocytes Absolute 1.3 (*)    All other components within normal limits  COMPREHENSIVE METABOLIC PANEL WITH GFR - Abnormal; Notable for the following components:   Sodium 119 (*)    Chloride 86 (*)    CO2 18 (*)    Glucose, Bld 111 (*)    Calcium 8.4 (*)    Albumin 3.0 (*)    All other components within normal limits  OSMOLALITY - Abnormal; Notable for the following components:   Osmolality 252 (*)    All other components within normal limits  SODIUM - Abnormal; Notable for the following components:   Sodium 119 (*)    All other components within  normal limits  I-STAT CHEM 8, ED - Abnormal; Notable for the following components:   Sodium 118 (*)    Chloride 87 (*)    BUN 25 (*)    Glucose, Bld 111 (*)    Calcium, Ion 0.95 (*)    Hemoglobin 11.6 (*)    HCT 34.0 (*)    All other components within normal limits  MRSA NEXT GEN BY PCR, NASAL  CULTURE, BLOOD (ROUTINE X 2)  CULTURE, BLOOD (ROUTINE X 2)  PROTIME-INR  APTT  ETHANOL  SODIUM, URINE, RANDOM  OSMOLALITY, URINE  SODIUM  SODIUM  SODIUM  CBC  CORTISOL  CBC  BASIC METABOLIC PANEL WITH GFR  URINALYSIS, ROUTINE W REFLEX MICROSCOPIC  TSH  CBG MONITORING, ED    EKG: EKG Interpretation Date/Time:  Wednesday July 28 2024 15:22:14 EDT Ventricular Rate:  77 PR Interval:  164 QRS Duration:  136 QT Interval:  420 QTC Calculation: 475 R Axis:   -38  Text Interpretation: Sinus rhythm with occasional Premature ventricular complexes Left axis deviation Left bundle branch block Abnormal ECG When compared with ECG of 22-Sep-2023 16:05, PREVIOUS ECG IS PRESENT Confirmed by Jerrol Agent ([id number]) on 07/28/2024 4:19:00 PM  Radiology: CT HEAD WO CONTRAST Result Date: 07/28/2024 EXAM: CT HEAD WITHOUT CONTRAST 07/28/2024 03:42:35 PM TECHNIQUE: CT of the head was performed without the administration of intravenous contrast. Automated exposure control, iterative reconstruction, and/or weight based adjustment of the mA/kV was utilized to reduce the radiation dose to as low as reasonably achievable. COMPARISON: CT head 12/12/2023. CLINICAL HISTORY: Neuro deficit, acute, stroke suspected; fall. Pt BIB [facility]EMS coming from doctor's office called out for stroke like symptoms. AMS at baseline. Family says she had a fall 9 days ago and has not been able to walk without a walker since. Symptoms began 9 days ago reported by family. Patient also ; currently being treated for UTI. FINDINGS: BRAIN AND VENTRICLES: No acute intracranial hemorrhage. No evidence of acute infarct. No hydrocephalus. No  extra-axial collection. No mass effect or midline shift. Similar appearance of prominent mineralization of the occipital lobes, right greater than left, with additional mineralization extending into the inferior aspect of the parietal lobes. Similar mild mineralization involving the basal ganglia. Mild mineralization along the bilateral precentral gyri. Similar mineralization of the hippocampi. Extensive chronic microvascular ischemic changes. Similar parenchymal volume loss. Atherosclerosis of the carotid siphons. Minimal limitations of motion artifact and streak artifact. ORBITS: Bilateral lens replacement. SINUSES: Mucosal thickening in the right maxillary sinus. SOFT TISSUES AND SKULL: Subgaleal on the right. No significantly increased areas of mineralization. No skull fracture. IMPRESSION: 1. No acute intracranial abnormality. 2. Extensive chronic microvascular ischemic changes. 3. Similar parenchymal mineralization involving the parieto-occipital lobes (right greater than left), basal ganglia, precentral gyri, and hippocampi. 4. Right maxillary sinus mucosal thickening. Electronically signed by: Donnice Mania MD 07/28/2024 03:53 PM EDT RP Workstation: [id number]     .Critical Care  Performed by: Jerrol Agent, MD Authorized by: Jerrol Agent, MD   Critical care provider statement:    Critical care time (minutes):  30   Critical care was time spent personally by me on the following activities:  Development of treatment plan with patient or surrogate, discussions with consultants, evaluation of patient's response to treatment, examination of patient, ordering and review of laboratory studies, ordering and review of radiographic studies, ordering and performing treatments and interventions, pulse oximetry, re-evaluation of patient's condition and review of old charts   Care discussed with: admitting provider      Medications Ordered in the ED  sodium chloride (hypertonic) 3 % solution (  Intravenous New Bag/Given 07/28/24 1714)  LORazepam  (ATIVAN ) tablet 0.5 mg (has no administration in time range)  Chlorhexidine Gluconate Cloth 2 % PADS 6 each (has no administration in time range)  docusate sodium (COLACE) capsule 100 mg (has no administration in time range)  polyethylene glycol (MIRALAX / GLYCOLAX) packet 17 g (has no administration in time range)  heparin injection 5,000 Units (has no administration in time range)  metoprolol tartrate (LOPRESSOR) tablet 50 mg (has no administration in time range)  cefTRIAXone (ROCEPHIN) 1 g in sodium chloride 0.9 % 100 mL IVPB (has no administration in time range)  sodium chloride flush (NS) 0.9 %  injection 3 mL (3 mLs Intravenous Given 07/28/24 1626)    Clinical Course as of 07/28/24 1950  Wed Jul 28, 2024  1618 Sodium(!!): 118 [JL]    Clinical Course User Index [JL] Jerrol Agent, MD                                 Medical Decision Making Amount and/or Complexity of Data Reviewed Labs: ordered. Decision-making details documented in ED Course. Radiology: ordered.  Risk Prescription drug management. Decision regarding hospitalization.    89 year old female with medical history significant for prior CVA, SVT, frequent PVCs, partial seizures, memory loss, dementia (baseline alert and oriented to name and place) who presents to the emergency department with confusion and concern for strokelike symptoms.  Patient is coming from her doctor's office called out for strokelike symptoms.  Confused at baseline but having worsening confusion over the past week.  Had a fall 9 days ago and has been more weak generalized having difficulty walking without a walker since.  Symptoms been ongoing for the past 9 days but over the past few days family noticed worsening left-sided weakness.  She is also currently taking fluconazole for yeast infection. Family reports no seizure like activity.  On arrival, the patient was afebrile, not tachycardic or  tachypneic, BP 101/51, saturating 94% on room air.  Physical exam with a neuroexam that was difficult to appreciate clear focality, appears to be slightly weak in the left hemibody compared to right but patient would not comply with exam testing for the bilateral lower extremities.  Presenting with concern for acute hyponatremia with a sodium of 118 on i-STAT.   CT head: IMPRESSION:  1. No acute intracranial abnormality.  2. Extensive chronic microvascular ischemic changes.  3. Similar parenchymal mineralization involving the parieto-occipital lobes  (right greater than left), basal ganglia, precentral gyri, and hippocampi.  4. Right maxillary sinus mucosal thickening.    Spoke with Dr. Deedra of neuro who recommended MRI brain WO and to re-engage if acute CVA seen.   Labs: CMP with hyponatremia to 119, bicarb 18, anion gap 15, LFTs normal, creatinine normal, normal potassium, urinalysis pending, INR normal, CBG normal.  Serum awesome 252.  PCCM: Will admit to the ICU for close monitoring and neurochecks.  Family updated regarding the plan of care.  Patient stable without seizure activity at time of admission.  Discussed with PCCM, will add on CT cervical spine to rule out fracture.      Final diagnoses:  Weakness  Hyponatremia    ED Discharge Orders     None          Jerrol Agent, MD 07/28/24 2011    Jerrol Agent, MD 07/28/24 2118

## 2024-07-28 NOTE — ED Notes (Signed)
 Pt brought to MRI w/ RN

## 2024-07-28 NOTE — ED Notes (Signed)
 MD aware of critical sodium level. Waiting for medication to be sent from pharmacy

## 2024-07-28 NOTE — ED Triage Notes (Addendum)
 Pt BIB [facility]S coming from doctor's office called out for stroke like symptoms. AMS at baseline. Family says she had a fall 9 days ago and has not been able to walk without a walker since. Symptoms began 9 days ago reported by family. Patient also currently being treated for UTI.  EMS VS:  112/72 74 HR 100% RA CBG 134  97.2

## 2024-07-28 NOTE — ED Notes (Signed)
 Pt returned from MRI

## 2024-07-28 NOTE — H&P (Addendum)
 NAME:  Danielle Vaughan, MRN:  [id number], DOB:  [birth year], LOS: 0 ADMISSION DATE:  07/28/2024, CONSULTATION DATE:  10/29 REFERRING MD: Jerrol, EDP CHIEF COMPLAINT: hyponatremia    History of Present Illness:  89 year old female with past medical history of stroke, hypertension, hyperlipidemia, pre-diabetes, CKD II, partial seizures, dementia with baseline orientation to self and place presents to ED with stroke like symptoms.   Reportedly, patient arriving from her doctor's office after she was noted to have stroke-like symptoms with left sided weakness and confusion above her baseline for about 1 week. She had a fall 9 days prior and has had weakness with ambulating since. Additionally, family reports she is being treated for yeast infection with fluconazole.  In ED, afebrile, WBC 10.3, hgb 11.2, plt 543. Notably sodium 119*, K 4.8, Cl 86, bicarb 18, serum osmolality 252. CT head with no acute findings. Neurology was consulted and recommended MRI f/u. Additionally, patient started on 3% saline @ 50cc/hr. CCM consulted for admission.   Baseline per family is she does not always know answers to orientation questions. She sometimes hallucinates. She recently has been chewing rather than swallowing pills and blowing into a straw rather than drinking out of it.   Pertinent  Medical History  stroke, hypertension, hyperlipidemia, pre-diabetes, CKD II, partial seizures, dementia with baseline orientation to self and place  Significant Hospital Events: Including procedures, antibiotic start and stop dates in addition to other pertinent events   10/29: admit from ED for symptomatic hyponatremia on 3%   Interim History / Subjective:    Objective   Blood pressure 130/76, pulse 73, temperature 98 F (36.7 C), temperature source Oral, resp. rate 18, height 5' 1 (1.549 m), weight 56.2 kg, SpO2 94%.       No intake or output data in the 24 hours ending 07/28/24 1832 Filed Weights   07/28/24 1514   Weight: 56.2 kg    Examination: General: chronically ill appearing woman lying in bed in NAD HENT: [place]/AT, eyes anicteric  Lungs: breathing comfortably on RA, no conversational dyspnea Cardiovascular: skin well perfused, reg rate & rhythm Abdomen: nondistended Extremities: no peripheral edema, minimal muscle mass Neuro: awake, alert, attention to internal stimuli. Reports seeing things in the room that are not present. Moving all extremities on command but RUE seems weaker. Short attention span. Oriented to person; not oriented to place, time, situation.   Na+ 119, confirmed Osm 252 BUN 22 Cr 0.86  Resolved Hospital Problem list    Assessment & Plan:  Hypotonic hyponatremia, new-- suspect her symptoms are due to this with h/o poor PO intake per family.  Acute metabolic encephalopathy due to hyponatremia; has baseline dementia contributing to her symptoms as well Weakness; likely acute on chronic. RUE weakness seems more pronounced. - neurology consulted by ED - 3% saline @ 50cc/hr> anticipate with poor PO intake she may correct quickly - sodium q4h, do not correct to more than 8-59mEq in 24h since I suspect this is chronic -urine Na+  - check cortisol, TSH - MRI brain per neuro to rule out stroke etiology  -Ct c-spine to r/o fracture in discussion with EDP-- need to rule out traumatic fractures after her fall recnetly  Hyperlipidemia  Hypertension  - con't home amlodipine & metoprolol -can hold PTA benazepril  Paroxysmal SVT  - con't home metoprolol-- switch to tartrate -tele monitoring  Prediabetes  - check Ha1c  - ssi prn, accuchecks q4h  Remote h/o partial seizures; not on AEDs Dementia  History of  occipital stroke  -Tried Namenda but was dizzy so stopped in the past -con't daily ASA - family understands my concern that with hallucinations at baseline, she likely has more advanced dementia than they previously realized -family understands with hyponatremia she  does have an increased risk of seizures short-term  Full code, but would not want prolonged dependence on machines/ life support. Brother, son, daughters at bedside all agree on this.  Patient heard the word ventilator and said she didn't want that.    Labs   CBC: Recent Labs  Lab 07/28/24 1528 07/28/24 1533  WBC 10.3  --   NEUTROABS 7.1  --   HGB 11.2* 11.6*  HCT 31.6* 34.0*  MCV 88.3  --   PLT 543*  --     Basic Metabolic Panel: Recent Labs  Lab 07/28/24 1528 07/28/24 1533 07/28/24 1700  NA 119* 118* 119*  K 4.8 4.6  --   CL 86* 87*  --   CO2 18*  --   --   GLUCOSE 111* 111*  --   BUN 22 25*  --   CREATININE 0.86 1.00  --   CALCIUM 8.4*  --   --    GFR: Estimated Creatinine Clearance: 28.8 mL/min (by C-G formula based on SCr of 1 mg/dL). Recent Labs  Lab 07/28/24 1528  WBC 10.3    Liver Function Tests: Recent Labs  Lab 07/28/24 1528  AST 22  ALT 13  ALKPHOS 55  BILITOT 0.8  PROT 6.7  ALBUMIN 3.0*   No results for input(s): LIPASE, AMYLASE in the last 168 hours. No results for input(s): AMMONIA in the last 168 hours.  ABG    Component Value Date/Time   TCO2 22 07/28/2024 1533     Coagulation Profile: Recent Labs  Lab 07/28/24 1528  INR 1.1    Cardiac Enzymes: No results for input(s): CKTOTAL, CKMB, CKMBINDEX, TROPONINI in the last 168 hours.  HbA1C: Hgb A1c MFr Bld  Date/Time Value Ref Range Status  11/29/2021 04:27 PM 6.0 (H) 4.8 - 5.6 % Final    Comment:             Prediabetes: 5.7 - 6.4          Diabetes: >6.4          Glycemic control for adults with diabetes: <7.0     CBG: Recent Labs  Lab 07/28/24 1533  GLUCAP 98    Review of Systems:   As above  Past Medical History:  She,  has a past medical history of Hypertension, Stroke (HCC), and Vertigo.   Surgical History:   Past Surgical History:  Procedure Laterality Date   PARTIAL HYSTERECTOMY       Social History:   reports that she has never  smoked. She has never used smokeless tobacco. She reports that she does not drink alcohol and does not use drugs.   Family History:  Her family history includes Other in her father and mother. She was adopted.   Allergies Allergies  Allergen Reactions   Cefoxitin    Ceftin [Cefuroxime Axetil] Other (See Comments)    Seeing lights   Codeine Other (See Comments)    Unknown     Home Medications  Prior to Admission medications   Medication Sig Start Date End Date Taking? Authorizing Provider  acetaminophen  (TYLENOL ) 500 MG tablet Take 500-1,000 mg by mouth every 6 (six) hours as needed for moderate pain (pain score 4-6).   Yes [provider]  amLODipine (NORVASC) 5 MG tablet Take 5 mg by mouth at bedtime.    Yes [provider]  benazepril (LOTENSIN) 40 MG tablet Take 40 mg by mouth at bedtime.    Yes [provider]  Camphor-Menthol-Methyl Sal (SALONPAS EX) Apply 1 Application topically daily as needed (Pain).   Yes [provider]  fluconazole (DIFLUCAN) 150 MG tablet Take 150 mg by mouth daily. 07/26/24  Yes [provider]  ibuprofen (ADVIL) 200 MG tablet Take 200-800 mg by mouth every 6 (six) hours as needed for moderate pain (pain score 4-6).   Yes [provider]  ketoconazole (NIZORAL) 2 % cream Apply 1 Application topically 2 (two) times daily. 07/26/24  Yes [provider]  Menthol, Topical Analgesic, (BIOFREEZE EX) Apply 1 Application topically as needed (Pain).   Yes [provider]  Menthol, Topical Analgesic, (ICY HOT ADVANCED RELIEF EX) Apply 1 Application topically as needed (Pain).   Yes [provider]  metoprolol succinate (TOPROL-XL) 100 MG 24 hr tablet Take 100 mg by mouth daily. Take with or immediately following a meal.   Yes [provider]  Polyethyl Glycol-Propyl Glycol (SYSTANE FREE OP) Place 1 drop into both eyes at bedtime.   Yes [provider]     Critical care  time: 35 min     Leita SHAUNNA Gaskins, DO 07/28/24 7:57 PM [facility]  For contact information, see Amion. If no response to pager, please call PCCM consult pager. After hours, 7PM- 7AM, please call Elink.

## 2024-07-28 NOTE — ED Notes (Signed)
 Pt with visual hallucinations, per family at bedside, this is pt's baseline

## 2024-07-29 ENCOUNTER — Encounter (HOSPITAL_COMMUNITY): Payer: Self-pay | Admitting: Critical Care Medicine

## 2024-07-29 DIAGNOSIS — R7303 Prediabetes: Secondary | ICD-10-CM

## 2024-07-29 LAB — CBC
HCT: 35.8 % — ABNORMAL LOW (ref 36.0–46.0)
Hemoglobin: 12.3 g/dL (ref 12.0–15.0)
MCH: 30.6 pg (ref 26.0–34.0)
MCHC: 34.4 g/dL (ref 30.0–36.0)
MCV: 89.1 fL (ref 80.0–100.0)
Platelets: 542 K/uL — ABNORMAL HIGH (ref 150–400)
RBC: 4.02 MIL/uL (ref 3.87–5.11)
RDW: 16.3 % — ABNORMAL HIGH (ref 11.5–15.5)
WBC: 9.1 K/uL (ref 4.0–10.5)
nRBC: 0 % (ref 0.0–0.2)

## 2024-07-29 LAB — BASIC METABOLIC PANEL WITH GFR
Anion gap: 17 — ABNORMAL HIGH (ref 5–15)
BUN: 12 mg/dL (ref 8–23)
CO2: 20 mmol/L — ABNORMAL LOW (ref 22–32)
Calcium: 8.7 mg/dL — ABNORMAL LOW (ref 8.9–10.3)
Chloride: 90 mmol/L — ABNORMAL LOW (ref 98–111)
Creatinine, Ser: 0.58 mg/dL (ref 0.44–1.00)
GFR, Estimated: >60 mL/min (ref >60)
Glucose, Bld: 89 mg/dL (ref 70–99)
Potassium: 4.3 mmol/L (ref 3.5–5.1)
Sodium: 127 mmol/L — ABNORMAL LOW (ref 135–145)

## 2024-07-29 LAB — URINALYSIS, ROUTINE W REFLEX MICROSCOPIC
Bilirubin Urine: NEGATIVE
Glucose, UA: NEGATIVE mg/dL
Ketones, ur: 5 mg/dL — AB
Nitrite: NEGATIVE
Protein, ur: NEGATIVE mg/dL
Specific Gravity, Urine: 1.008 (ref 1.005–1.030)
WBC, UA: >50 WBC/hpf (ref 0–5)
pH: 5 (ref 5.0–8.0)

## 2024-07-29 LAB — SODIUM
Sodium: 126 mmol/L — ABNORMAL LOW (ref 135–145)
Sodium: 127 mmol/L — ABNORMAL LOW (ref 135–145)
Sodium: 125 mmol/L — ABNORMAL LOW (ref 135–145)

## 2024-07-29 LAB — SODIUM, URINE, RANDOM: Sodium, Ur: 69 mmol/L

## 2024-07-29 LAB — OSMOLALITY, URINE: Osmolality, Ur: 318 mosm/kg (ref 300–900)

## 2024-07-29 LAB — GLUCOSE, CAPILLARY
Glucose-Capillary: 94 mg/dL (ref 70–99)
Glucose-Capillary: 83 mg/dL (ref 70–99)

## 2024-07-29 LAB — HEMOGLOBIN A1C
Hgb A1c MFr Bld: 5.3 % (ref 4.8–5.6)
Mean Plasma Glucose: 105.41 mg/dL

## 2024-07-29 LAB — MAGNESIUM: Magnesium: 1.8 mg/dL (ref 1.7–2.4)

## 2024-07-29 LAB — PHOSPHORUS: Phosphorus: 3.7 mg/dL (ref 2.5–4.6)

## 2024-07-29 MED ORDER — SODIUM CHLORIDE 0.9 % IV SOLN
1.0000 g | INTRAVENOUS | Status: AC
  Administered 2024-07-29 – 2024-07-30 (×2): 1 g via INTRAVENOUS
  Filled 2024-07-29 (×2): qty 10

## 2024-07-29 MED ORDER — MAGNESIUM SULFATE 2 GM/50ML IV SOLN
2.0000 g | Freq: Once | INTRAVENOUS | Status: AC
  Administered 2024-07-29: 2 g via INTRAVENOUS
  Filled 2024-07-29: qty 50

## 2024-07-29 MED ORDER — ENSURE PLUS HIGH PROTEIN PO LIQD
237.0000 mL | Freq: Two times a day (BID) | ORAL | Status: DC
  Administered 2024-07-30 – 2024-08-05 (×8): 237 mL via ORAL

## 2024-07-29 MED ORDER — METOPROLOL TARTRATE 5 MG/5ML IV SOLN: 2.5000 mg | INTRAVENOUS | Status: DC | PRN

## 2024-07-29 MED ORDER — FLUCONAZOLE 150 MG PO TABS
150.0000 mg | ORAL_TABLET | Freq: Every day | ORAL | Status: AC
  Administered 2024-07-30 – 2024-08-01 (×3): 150 mg via ORAL
  Filled 2024-07-29 (×4): qty 1

## 2024-07-29 NOTE — Evaluation (Signed)
 Clinical/Bedside Swallow Evaluation Patient Details  Name: Danielle Vaughan MRN: [id number] Date of Birth: 09/05/1933  Today's Date: 07/29/2024 Time: SLP Start Time (ACUTE ONLY): 1356 SLP Stop Time (ACUTE ONLY): 1435 SLP Time Calculation (min) (ACUTE ONLY): 39 min  Past Medical History:  Past Medical History:  Diagnosis Date   Hypertension    Stroke [facility])    Vertigo    Past Surgical History:  Past Surgical History:  Procedure Laterality Date   PARTIAL HYSTERECTOMY     HPI:  89 yo female presenting to ED 10/29 with L sided weakness and increased confusion x1 week s/p recent fall and yeast infection. MRI severely limited by intolerance and motion artifact, though superimposed ischemic infarcts may be present throughout the posterior frontal, parietal, and occipital lobes. Family reports recent oral deficits characterized by chewing pills and blowing rather than sucking through a straw. PMH includes HTN, prior CVA, HLD, pre-diabetes, CKD II, partial seizures, dementia    Assessment / Plan / Recommendation  Clinical Impression  Recommend resuming POs despite limited evaluation. Will initiate Dys 3 solids with thin liquids. She will require full supervision for meals. Her family acknowledges risk of aspiration and restates that they do not feel pt would want artificial means of nutrition. Recommend PMT consult to discuss their goals further and SLP will f/u to reinforce education re: aspiration precautions and course of dysphagia in pts with dementia.   Evaluation was overall limited by pt's cognition, which pt's family states is near her baseline (will defer further cognitive-linguistic evaluation). She took in very small volumes of thin liquids without overt s/s of aspiration but was more agreeable to purees and solids. Oral transit is overall efficient and clearance is complete. Risk of aspiration is inherent given pt's baseline dementia. Her family also describes recent onset of oral  deficits that are characteristic of a cognitive dysphagia.  SLP Visit Diagnosis: Dysphagia, unspecified (R13.10)    Aspiration Risk  Moderate aspiration risk    Diet Recommendation Dysphagia 2 (Fine chop);Thin liquid    Liquid Administration via: Cup;Straw Medication Administration: Whole meds with puree Supervision: Staff to assist with self feeding;Full supervision/cueing for compensatory strategies Compensations: Minimize environmental distractions;Slow rate;Small sips/bites Postural Changes: Seated upright at 90 degrees    Other  Recommendations Oral Care Recommendations: Oral care QID     Assistance Recommended at Discharge    Functional Status Assessment Patient has had a recent decline in their functional status and demonstrates the ability to make significant improvements in function in a reasonable and predictable amount of time.  Frequency and Duration min 2x/week  2 weeks       Prognosis Prognosis for improved oropharyngeal function: Fair Barriers to Reach Goals: Cognitive deficits;Severity of deficits      Swallow Study   General HPI: 89 yo female presenting to ED 10/29 with L sided weakness and increased confusion x1 week s/p recent fall and yeast infection. MRI severely limited by intolerance and motion artifact, though superimposed ischemic infarcts may be present throughout the posterior frontal, parietal, and occipital lobes. Family reports recent oral deficits characterized by chewing pills and blowing rather than sucking through a straw. PMH includes HTN, prior CVA, HLD, pre-diabetes, CKD II, partial seizures, dementia Type of Study: Bedside Swallow Evaluation Previous Swallow Assessment: none in chart Diet Prior to this Study: NPO Temperature Spikes Noted: No Respiratory Status: Room air History of Recent Intubation: No Behavior/Cognition: Alert;Cooperative;Requires cueing Oral Cavity Assessment: Within Functional Limits Oral Care Completed by SLP: No Oral  Cavity - Dentition: Adequate natural dentition Vision: Functional for self-feeding Self-Feeding Abilities: Total assist Patient Positioning: Upright in bed Baseline Vocal Quality: Normal Volitional Cough: Strong Volitional Swallow: Able to elicit    Oral/Motor/Sensory Function Overall Oral Motor/Sensory Function: Within functional limits   Ice Chips Ice chips: Not tested   Thin Liquid Thin Liquid: Within functional limits Presentation: Straw    Nectar Thick Nectar Thick Liquid: Not tested   Honey Thick Honey Thick Liquid: Not tested   Puree Puree: Within functional limits Presentation: Spoon   Solid     Solid: Within functional limits      Damien Blumenthal, M.A., CCC-SLP Speech Language Pathology, Acute Rehabilitation Services  Secure Chat preferred [phone]  07/29/2024,3:05 PM

## 2024-07-29 NOTE — Progress Notes (Signed)
 Patient has non pitting edema to left hand and wrist. Its also red and painful to touch. Dr. Harden Staff paged. Response pending

## 2024-07-29 NOTE — Progress Notes (Signed)
 Daughter at bedside, updated.  Tells me pt has had functional decline over several weeks, a fall several weeks ago, less mobile, not eating and drinking as much, and now chews her pills instead of swallowing them. Baseline oriented to self only and previously able to use walker to bathroom but not recently.    - trying to start PO's however pt has not cooperated with bedside swallow yet.  Daughter does not think she would want a feed tubing but will discuss more with family.   - noted UA previously sent noted with mod leuks, > 50 WBCs, many bacteria, 0-5 squamous, some concern sent of old purwick> will resend.  Already on ctx to cover empiric UTI  - family discusses concern if this is progression of her dementia, which are valid concerns.  PMT consult discussed to continue ongoing and longterm goals with daughter.  She would like to hold off for now pending further discussion with her siblings.  They do know pt would not want long term support     Lyle Pesa, NP [facility] 07/29/2024, 1:39 PM  See Amion for pager If no response to pager , please call [phone] until 7pm After 7:00 pm call Elink  [phone]

## 2024-07-29 NOTE — Progress Notes (Signed)
 NAME:  Danielle Vaughan, MRN:  [id number], DOB:  1933/06/08, LOS: 1 ADMISSION DATE:  07/28/2024, CONSULTATION DATE:  10/29 REFERRING MD: Jerrol, EDP CHIEF COMPLAINT: hyponatremia    History of Present Illness:  89 year old female with past medical history of stroke, hypertension, hyperlipidemia, pre-diabetes, CKD II, partial seizures, dementia with baseline orientation to self and place presents to ED with stroke like symptoms.   Reportedly, patient arriving from her doctor's office after she was noted to have stroke-like symptoms with left sided weakness and confusion above her baseline for about 1 week. She had a fall 9 days prior and has had weakness with ambulating since. Additionally, family reports she is being treated for yeast infection with fluconazole.  In ED, afebrile, WBC 10.3, hgb 11.2, plt 543. Notably sodium 119*, K 4.8, Cl 86, bicarb 18, serum osmolality 252. CT head with no acute findings. Neurology was consulted and recommended MRI f/u. Additionally, patient started on 3% saline @ 50cc/hr. CCM consulted for admission.   Baseline per family is she does not always know answers to orientation questions. She sometimes hallucinates. She recently has been chewing rather than swallowing pills and blowing into a straw rather than drinking out of it.   Pertinent  Medical History  stroke, hypertension, hyperlipidemia, pre-diabetes, CKD II, partial seizures, dementia with baseline orientation to self and place  Significant Hospital Events: Including procedures, antibiotic start and stop dates in addition to other pertinent events   10/29: admit from ED for symptomatic hyponatremia on 3%   Interim History / Subjective:  HTS stopped with Na overnight 169> rechecked 124 No complaints per pt Stable neuro exam MRI overnight> limited exam, motion degraded  Objective   Blood pressure (!) 140/82, pulse 85, temperature 98.3 F (36.8 C), temperature source Oral, resp. rate 19, height 5'  1 (1.549 m), weight 51.1 kg, SpO2 100%.        Intake/Output Summary (Last 24 hours) at 07/29/2024 9177 Last data filed at 07/29/2024 0700 Gross per 24 hour  Intake 344.57 ml  Output 325 ml  Net 19.57 ml   Filed Weights   07/28/24 1514 07/29/24 0410  Weight: 56.2 kg 51.1 kg    Examination: General:  chronically ill appearing elderly female lying in bed in NAD HEENT: MM pink/minimally moist, anicteric, pupils 4/r Neuro: Awake, oriented to person, [place], birthday.  Will follow simple commands, MAE spont> seems weaker on left side.  +gravity in BUE, no gravity in LE CV: rr ir- PVCs PULM:  non labored, CTA GI: soft, bs+, NT/ ND, purwick Extremities: warm/dry, no LE edema, poor muscle mass Skin: no rashes  Labs > Na 127, K 4.3, Cl 90, bicarb 20, sCr 0.58, Mag 1.8, phos 3.7  MRI brain 10/29 >  1. Severely limited examination due to patient intolerance and motion artifact. 2. Scattered foci of diffusion signal abnormality in the posterior frontal, parietal, and occipital lobes as above. While these findings likely at least in partartifactual in nature from he underlying chronic hanges and mineralization n these regions possible superimposed ischemic infarcts ay be present as well. No visible associated hemorrhage or mass effect. 3. Underlying atrophy with chronic microvascular ischemic disease, with chronic right occipital encephalomalacia.  Resolved Hospital Problem list    Assessment & Plan:  Hypotonic hyponatremia, new-- suspect her symptoms are due to this with h/o poor PO intake per family.  Acute metabolic encephalopathy due to hyponatremia; has baseline dementia contributing to her symptoms as well Weakness; likely acute on chronic. RUE weakness  seems more pronounced. - serum Osm 252, Na 119 on admit, previous Na 139 in 08/2023 - on HTS for 3 hours last evening, since correcting without intervention  119> 124> 126> 127.  Goal correction 8-10 meq for initial 24hrs - ok to  transfer to PCU and to [facility] as of 10/31 - need to send urine studies  - if passes bedside swallow> start clears  - TSH 0.472, cortisol 19.3 - MRI overnight motion degraded> see above - cervical CT neg for acute process  - PT/ OT - suspect her mental status is at baseline,  cont neuro checks - fall precautions - cont neuroprotective measures - seizure precautions  - ?UTI> need UA.  Remains afebrile/ WBC normal.  Send UA today, likely stop  Hyperlipidemia  Hypertension  - resume amlodipine & metoprolol when taking POs - hold PTA benazepril  Paroxysmal SVT  - metoprolol  - tele - optimize electrolytes   Prediabetes  -  Ha1c 5.3 - trend glucose on BMET> 83> 117  Remote h/o partial seizures; not on AEDs Dementia  History of occipital stroke  -Tried Namenda but was dizzy so stopped in the past P:  - cont ASA  - delirium precautions   GOC - full code per family discussions 10/29 but would not prolonged dependent on machines/life support    Labs   CBC: Recent Labs  Lab 07/28/24 1528 07/28/24 1533  WBC 10.3  --   NEUTROABS 7.1  --   HGB 11.2* 11.6*  HCT 31.6* 34.0*  MCV 88.3  --   PLT 543*  --     Basic Metabolic Panel: Recent Labs  Lab 07/28/24 1528 07/28/24 1533 07/28/24 1700 07/28/24 2035 07/28/24 2241 07/29/24 0228  NA 119* 118* 119* 169* 124* 126*  K 4.8 4.6  --   --   --   --   CL 86* 87*  --   --   --   --   CO2 18*  --   --   --   --   --   GLUCOSE 111* 111*  --   --   --   --   BUN 22 25*  --   --   --   --   CREATININE 0.86 1.00  --   --   --   --   CALCIUM 8.4*  --   --   --   --   --    GFR: Estimated Creatinine Clearance: 28.8 mL/min (by C-G formula based on SCr of 1 mg/dL). Recent Labs  Lab 07/28/24 1528  WBC 10.3    Liver Function Tests: Recent Labs  Lab 07/28/24 1528  AST 22  ALT 13  ALKPHOS 55  BILITOT 0.8  PROT 6.7  ALBUMIN 3.0*   No results for input(s): LIPASE, AMYLASE in the last 168 hours. No results for  input(s): AMMONIA in the last 168 hours.  ABG    Component Value Date/Time   TCO2 22 07/28/2024 1533     Coagulation Profile: Recent Labs  Lab 07/28/24 1528  INR 1.1    Cardiac Enzymes: No results for input(s): CKTOTAL, CKMB, CKMBINDEX, TROPONINI in the last 168 hours.  HbA1C: Hgb A1c MFr Bld  Date/Time Value Ref Range Status  07/29/2024 02:28 AM 5.3 4.8 - 5.6 % Final    Comment:    (NOTE) Diagnosis of Diabetes The following HbA1c ranges recommended by the American Diabetes Association (ADA) may be used as an aid in the diagnosis of  diabetes mellitus.  Hemoglobin             Suggested A1C NGSP%              Diagnosis  <5.7                   Non Diabetic  5.7-6.4                Pre-Diabetic  >6.4                   Diabetic  <7.0                   Glycemic control for                       adults with diabetes.    11/29/2021 04:27 PM 6.0 (H) 4.8 - 5.6 % Final    Comment:             Prediabetes: 5.7 - 6.4          Diabetes: >6.4          Glycemic control for adults with diabetes: <7.0     CBG: Recent Labs  Lab 07/28/24 2032 07/28/24 2145 07/28/24 2326 07/29/24 0317 07/29/24 0746  GLUCAP 117* 109* 111* 94 83   Allergies Allergies  Allergen Reactions   Cefoxitin    Ceftin [Cefuroxime Axetil] Other (See Comments)    Seeing lights   Codeine Other (See Comments)    Unknown     Home Medications  Prior to Admission medications   Medication Sig Start Date End Date Taking? Authorizing Provider  acetaminophen  (TYLENOL ) 500 MG tablet Take 500-1,000 mg by mouth every 6 (six) hours as needed for moderate pain (pain score 4-6).   Yes [provider]  amLODipine (NORVASC) 5 MG tablet Take 5 mg by mouth at bedtime.    Yes [provider]  benazepril (LOTENSIN) 40 MG tablet Take 40 mg by mouth at bedtime.    Yes [provider]  Camphor-Menthol-Methyl Sal (SALONPAS EX) Apply 1 Application topically daily as needed (Pain).    Yes [provider]  fluconazole (DIFLUCAN) 150 MG tablet Take 150 mg by mouth daily. 07/26/24  Yes [provider]  ibuprofen (ADVIL) 200 MG tablet Take 200-800 mg by mouth every 6 (six) hours as needed for moderate pain (pain score 4-6).   Yes [provider]  ketoconazole (NIZORAL) 2 % cream Apply 1 Application topically 2 (two) times daily. 07/26/24  Yes [provider]  Menthol, Topical Analgesic, (BIOFREEZE EX) Apply 1 Application topically as needed (Pain).   Yes [provider]  Menthol, Topical Analgesic, (ICY HOT ADVANCED RELIEF EX) Apply 1 Application topically as needed (Pain).   Yes [provider]  metoprolol succinate (TOPROL-XL) 100 MG 24 hr tablet Take 100 mg by mouth daily. Take with or immediately following a meal.   Yes [provider]  Polyethyl Glycol-Propyl Glycol (SYSTANE FREE OP) Place 1 drop into both eyes at bedtime.   Yes [provider]     Critical care time: n/a      Lyle Pesa, NP [facility] 07/29/2024, 8:22 AM  See Amion for pager If no response to pager , please call [phone] until 7pm After 7:00 pm call Elink  [phone]

## 2024-07-29 NOTE — Progress Notes (Signed)
 Pharmacy Electrolyte Replacement  Recent Labs:  Recent Labs    07/29/24 0817  K 4.3  MG 1.8  PHOS 3.7  CREATININE 0.58    Low Critical Values (K </= 2.5, Phos </= 1, Mg </= 1) Present: None  MD Contacted: N/A, per protocol   Plan:  Give Mag sulfate 2g IV x 1 dose  Reevaluate replacement needs with tomorrows labs.

## 2024-07-30 DIAGNOSIS — Z7189 Other specified counseling: Secondary | ICD-10-CM

## 2024-07-30 DIAGNOSIS — G9341 Metabolic encephalopathy: Secondary | ICD-10-CM | POA: Diagnosis not present

## 2024-07-30 DIAGNOSIS — Z515 Encounter for palliative care: Secondary | ICD-10-CM | POA: Diagnosis not present

## 2024-07-30 DIAGNOSIS — E871 Hypo-osmolality and hyponatremia: Secondary | ICD-10-CM

## 2024-07-30 LAB — BASIC METABOLIC PANEL WITH GFR
Anion gap: 15 (ref 5–15)
BUN: 12 mg/dL (ref 8–23)
CO2: 20 mmol/L — ABNORMAL LOW (ref 22–32)
Calcium: 7.9 mg/dL — ABNORMAL LOW (ref 8.9–10.3)
Chloride: 90 mmol/L — ABNORMAL LOW (ref 98–111)
Creatinine, Ser: 0.71 mg/dL (ref 0.44–1.00)
GFR, Estimated: >60 mL/min (ref >60)
Glucose, Bld: 111 mg/dL — ABNORMAL HIGH (ref 70–99)
Potassium: 4 mmol/L (ref 3.5–5.1)
Sodium: 125 mmol/L — ABNORMAL LOW (ref 135–145)

## 2024-07-30 LAB — VITAMIN B12: Vitamin B-12: 220 pg/mL (ref 180–914)

## 2024-07-30 LAB — MAGNESIUM: Magnesium: 2.1 mg/dL (ref 1.7–2.4)

## 2024-07-30 LAB — PHOSPHORUS: Phosphorus: 3.4 mg/dL (ref 2.5–4.6)

## 2024-07-30 MED ORDER — SODIUM CHLORIDE 0.9 % IV SOLN: INTRAVENOUS | Status: DC

## 2024-07-30 MED ORDER — ACETAMINOPHEN 325 MG PO TABS
650.0000 mg | ORAL_TABLET | Freq: Four times a day (QID) | ORAL | Status: DC | PRN
  Administered 2024-07-30 – 2024-08-04 (×8): 650 mg via ORAL
  Filled 2024-07-30 (×8): qty 2

## 2024-07-30 MED ORDER — SODIUM CHLORIDE 1 G PO TABS
2.0000 g | ORAL_TABLET | Freq: Three times a day (TID) | ORAL | Status: DC
  Administered 2024-07-30 – 2024-08-02 (×11): 2 g via ORAL
  Filled 2024-07-30 (×10): qty 2

## 2024-07-30 NOTE — Plan of Care (Signed)
   Problem: Education: Goal: Knowledge of General Education information will improve Description: Including pain rating scale, medication(s)/side effects and non-pharmacologic comfort measures Outcome: Progressing   Problem: Health Behavior/Discharge Planning: Goal: Ability to manage health-related needs will improve Outcome: Progressing   Problem: Clinical Measurements: Goal: Will remain free from infection Outcome: Progressing

## 2024-07-30 NOTE — Consult Note (Signed)
 Consultation Note Date: 07/30/2024   Patient Name: Danielle Vaughan  DOB: 10-21-1932  MRN: [id number]  Age / Sex: 89 y.o., female   PCP: [name], MD Referring Physician: Sherlon Brayton RAMAN, MD  Reason for Consultation: Establishing goals of care     Chief Complaint/History of Present Illness:   Patient is an 89 year old female with past medical history of stroke, hypertension, hyperlipidemia, prediabetes, CKD to 2, partial seizures, dementia his baseline orientation is to self and place who presented to the ED with strokelike symptoms.  Family reports that for the past couple of weeks she has been weaker and more confused but then was sent from doctor's office after having left-sided weakness and confusion above her baseline.  Of note she did have a fall 9 days prior to having this weakness.  Baseline mental status per family reports that she is not always able to answer orientation questions and sometimes has hallucination.  EMR reviewed including personal review of labs notable forSodium of 125, creatinine 0.71, hemoglobin 12.3, and platelets of 542.  CT of head and neck personally reviewed with no gross signs of interior parenchymal process.  I saw and met with Ms. Suber today.  She was lying in bed in no distress at time of my encounter.  She is pleasantly confused and reports that she is feeling much better today.  Her brother is in the room and tells me that Ms. Helman lives with her son and daughter.  She has 2 sons total and 1 daughter.  She is 1 of 7 children and currently has 2 brothers and 2 sisters who are still living.  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  Zachary reports that family has been having discussions about care plan including limitation of care being that she would not want to be on life support long-term, however, she would like to have aggressive interventions performed in an acute event.  He also  reports family has been talking about if she would ever want to have a feeding tube and he does not think this would be the case.  He is very clear that his role is as her brother and her children will make any decisions on her behalf.  He specifically tells me I talked to [place], who is her daughter.  I called and was able to reach her daughter.  I introduced palliative care as specialized medical care for people living with serious illness. It focuses on providing relief from the symptoms and stress of a serious illness. The goal is to improve quality of life for both the patient and the family.  We discussed her clinical course this admission with concern particularly regarding her intake moving forward.  Her daughter relayed the same information that Zachary and I discussed that family has established that she would want to have aggressive interventions in an emergent event, however, she would not want to be kept alive via mechanical ventilation long-term.  Her daughter also thinks that she would not want to have a long-term feeding tube.  We discussed a plan to see how she continues to do over the weekend understanding that if she is not able to maintain enough nutrition and hydration to sustain her to have further conversations regarding wound care plan clinical moving forward.  Primary Diagnoses  Present on Admission:  Acute hyponatremia   Palliative Review of Systems: Patient denies all complaints, however, she is confused and a  poor historian.  Past Medical History:  Diagnosis Date   Hypertension    Stroke [facility])    Vertigo    Social History   Socioeconomic History   Marital status: Widowed    Spouse name: Not on file   Number of children: 6   Years of education: 10th grade   Highest education level: Not on file  Occupational History   Occupation: Retired  Tobacco Use   Smoking status: Never   Smokeless tobacco: Never  Vaping Use   Vaping status: Never Used  Substance and  Sexual Activity   Alcohol use: No   Drug use: Never   Sexual activity: Never  Other Topics Concern   Not on file  Social History Narrative   Right-handed.   No daily use of caffeine.   She lives at home with three of her children (she has four living).   Social Drivers of Corporate Investment Banker Strain: Low Risk  (07/29/2024)   Overall Financial Resource Strain (CARDIA)    Difficulty of Paying Living Expenses: Not hard at all  Food Insecurity: No Food Insecurity (07/29/2024)   Hunger Vital Sign    Worried About Running Out of Food in the Last Year: Never true    Ran Out of Food in the Last Year: Never true  Transportation Needs: No Transportation Needs (07/29/2024)   PRAPARE - Administrator, Civil Service (Medical): No    Lack of Transportation (Non-Medical): No  Physical Activity: Inactive (07/29/2024)   Exercise Vital Sign    Days of Exercise per Week: 0 days    Minutes of Exercise per Session: 0 min  Stress: No Stress Concern Present (07/29/2024)   [employer] of Occupational Health - Occupational Stress Questionnaire    Feeling of Stress: Not at all  Social Connections: Moderately Integrated (07/29/2024)   Social Connection and Isolation Panel    Frequency of Communication with Friends and Family: Once a week    Frequency of Social Gatherings with Friends and Family: More than three times a week    Attends Religious Services: 1 to 4 times per year    Active Member of [employer] or Organizations: Yes    Attends Banker Meetings: 1 to 4 times per year    Marital Status: Widowed   Family History  Adopted: Yes  Problem Relation Age of Onset   Other Mother        unsure of history   Other Father        unsure of history   Scheduled Meds:  Chlorhexidine Gluconate Cloth  6 each Topical Daily   feeding supplement  237 mL Oral BID BM   fluconazole  150 mg Oral Daily   heparin  5,000 Units Subcutaneous Q8H   sodium chloride  2 g Oral TID WC    Continuous Infusions:  sodium chloride 50 mL/hr at 07/30/24 1413   cefTRIAXone (ROCEPHIN)  IV 1 g (07/29/24 2259)   PRN Meds:.acetaminophen , docusate sodium, metoprolol tartrate, mouth rinse, polyethylene glycol Allergies  Allergen Reactions   Cefoxitin     Tolerates Ceftriaxone 07/29/2024   Ceftin [Cefuroxime Axetil] Other (See Comments)    Seeing lights Tolerates Ceftriaxone 07/29/2024    Codeine Other (See Comments)    Unknown   CBC:    Component Value Date/Time   WBC 9.1 07/29/2024 1048   HGB 12.3 07/29/2024 1048   HCT 35.8 (L) 07/29/2024 1048   PLT 542 (H) 07/29/2024 1048   MCV  89.1 07/29/2024 1048   NEUTROABS 7.1 07/28/2024 1528   LYMPHSABS 1.7 07/28/2024 1528   MONOABS 1.3 (H) 07/28/2024 1528   EOSABS 0.1 07/28/2024 1528   BASOSABS 0.1 07/28/2024 1528   Comprehensive Metabolic Panel:    Component Value Date/Time   NA 125 (L) 07/30/2024 0548   K 4.0 07/30/2024 0548   CL 90 (L) 07/30/2024 0548   CO2 20 (L) 07/30/2024 0548   BUN 12 07/30/2024 0548   CREATININE 0.71 07/30/2024 0548   GLUCOSE 111 (H) 07/30/2024 0548   CALCIUM 7.9 (L) 07/30/2024 0548   AST 22 07/28/2024 1528   ALT 13 07/28/2024 1528   ALKPHOS 55 07/28/2024 1528   BILITOT 0.8 07/28/2024 1528   PROT 6.7 07/28/2024 1528   ALBUMIN 3.0 (L) 07/28/2024 1528    Physical Exam: Vital Signs: BP 112/77 (BP Location: Left Arm)   Pulse 95   Temp 98.5 F (36.9 C) (Oral)   Resp (!) 25   Ht 5' 1 (1.549 m)   Wt 51.5 kg   SpO2 98%   BMI 21.45 kg/m  SpO2: SpO2: 98 % O2 Device: O2 Device: Room Air O2 Flow Rate:   Intake/output summary:  Intake/Output Summary (Last 24 hours) at 07/30/2024 1623 Last data filed at 07/30/2024 0121 Gross per 24 hour  Intake --  Output 200 ml  Net -200 ml   LBM: Last BM Date :  (PTA) Baseline Weight: Weight: 56.2 kg Most recent weight: Weight: 51.5 kg  General: NAD, alert frail Eyes: conjunctiva clear, anicteric sclera HENT: normocephalic, atraumatic, moist  mucous membranes Cardiovascular: RRR, no edema in LE b/l Respiratory: no increased work of breathing noted, not in respiratory distress Abdomen: not distended Extremities:  Skin: no rashes or lesions on visible skin Neuro: Pleasantly confused Psych: No agitation or restlessness, but often sits and stares when asked questions rather than responding          Palliative Performance Scale: 40              Additional Data Reviewed: Recent Labs    07/28/24 1528 07/28/24 1533 07/28/24 1700 07/29/24 0817 07/29/24 1048 07/29/24 1711 07/29/24 2247 07/30/24 0548  WBC 10.3  --   --   --  9.1  --   --   --   HGB 11.2* 11.6*  --   --  12.3  --   --   --   PLT 543*  --   --   --  542*  --   --   --   NA 119* 118*   < > 127*  --    < > 125* 125*  BUN 22 25*  --  12  --   --   --  12  CREATININE 0.86 1.00  --  0.58  --   --   --  0.71   < > = values in this interval not displayed.    Imaging: CT CERVICAL SPINE WO CONTRAST EXAM: CT CERVICAL SPINE WITHOUT CONTRAST 07/28/2024 11:20:10 PM  TECHNIQUE: CT of the cervical spine was performed without the administration of intravenous contrast. Multiplanar reformatted images are provided for review. Automated exposure control, iterative reconstruction, and/or weight based adjustment of the mA/kV was utilized to reduce the radiation dose to as low as reasonably achievable.  COMPARISON: 12/12/2023  CLINICAL HISTORY: Cervical radiculopathy, infection suspected, no prior imaging; fall.  FINDINGS:  CERVICAL SPINE:  BONES AND ALIGNMENT: No acute fracture or traumatic malalignment.  DEGENERATIVE CHANGES: Diffuse multilevel degenerative  disc and facet disease.  SOFT TISSUES: No prevertebral soft tissue swelling.  IMPRESSION: 1. No acute abnormality of the cervical spine. 2. Diffuse multilevel degenerative disc and facet disease.  Electronically signed by: Franky Crease MD 07/28/2024 11:27 PM EDT RP Workstation: [id number] MR BRAIN  WO CONTRAST EXAM: MRI BRAIN WITHOUT CONTRAST 07/28/2024 09:15:27 PM  TECHNIQUE: Multiplanar multisequence MRI of the head/brain was performed without the administration of intravenous contrast. Examination severely limited as the patient was unable to tolerate the full length of the exam. Additionally, provided images are severely degraded by motion artifact.  COMPARISON: Comparison made with CT from earlier the same day as well as previous MRI from 08/03/2023.  CLINICAL HISTORY:  FINDINGS:  BRAIN AND VENTRICLES: Examination severely limited as the patient was unable to tolerate the full length of the exam. Additionally, provided images are severely degraded by motion artifact. Age related subluxation. Moderate to advanced chronic microvascular ischemic disease. Chronic encephalomalacia at the right occipital lobe, grossly similar. There are scattered foci of diffusion signal abnormality involving the posterior frontal, parietal, and occipital lobes (series 2, image 43, 39, 35, 30, 25, 19). While these findings are suspected to at least in part be artifactual in nature due to the underlying chronic encephalomalacia and mineralization at these sites, possible superimposed ischemic infarcts could be present as well. No visible associated hemorrhage or mass effect. No other obvious acute intracranial abnormality. No hydrocephalus. No extravasated fluid collection. No visible mass lesion. No midline shift. The sella is unremarkable. Major intracranial vascular flow voids are not well assessed on this limited exam. Craniocervical junction not well assessed on this limited exam.  ORBITS: Globes and orbital soft tissues not well assessed on this limited exam.  SINUSES AND MASTOIDS: Right maxillary sinus retention cyst. Paranasal sinuses are otherwise grossly clear.  BONES AND SOFT TISSUES: Normal marrow signal. No acute soft tissue abnormality.  IMPRESSION: 1. Severely limited  examination due to patient intolerance and motion artifact. 2. Scattered foci of diffusion signal abnormality in the posterior frontal, parietal, and occipital lobes as above. While these findings likely at least in partartifactual in nature from he underlying chronic hanges and mineralization n these regions possible superimposed ischemic infarcts ay be present as well. No visible associated hemorrhage or mass effect. 3. Underlying atrophy with chronic microvascular ischemic disease, with chronic right occipital encephalomalacia.  Electronically signed by: Morene Hoard MD 07/28/2024 10:47 PM EDT RP Workstation: [id number] CT HEAD WO CONTRAST EXAM: CT HEAD WITHOUT CONTRAST 07/28/2024 03:42:35 PM  TECHNIQUE: CT of the head was performed without the administration of intravenous contrast. Automated exposure control, iterative reconstruction, and/or weight based adjustment of the mA/kV was utilized to reduce the radiation dose to as low as reasonably achievable.  COMPARISON: CT head 12/12/2023.  CLINICAL HISTORY: Neuro deficit, acute, stroke suspected; fall. Pt BIB [facility] coming from doctor's office called out for stroke like symptoms. AMS at baseline. Family says she had a fall 9 days ago and has not been able to walk without a walker since. Symptoms began 9 days ago reported by family. Patient also ; currently being treated for UTI.  FINDINGS:  BRAIN AND VENTRICLES: No acute intracranial hemorrhage. No evidence of acute infarct. No hydrocephalus. No extra-axial collection. No mass effect or midline shift. Similar appearance of prominent mineralization of the occipital lobes, right greater than left, with additional mineralization extending into the inferior aspect of the parietal lobes. Similar mild mineralization involving the basal ganglia. Mild mineralization along the bilateral precentral gyri. Similar mineralization  of the hippocampi. Extensive chronic microvascular  ischemic changes. Similar parenchymal volume loss. Atherosclerosis of the carotid siphons. Minimal limitations of motion artifact and streak artifact.  ORBITS: Bilateral lens replacement.  SINUSES: Mucosal thickening in the right maxillary sinus.  SOFT TISSUES AND SKULL: Subgaleal on the right. No significantly increased areas of mineralization. No skull fracture.  IMPRESSION: 1. No acute intracranial abnormality. 2. Extensive chronic microvascular ischemic changes. 3. Similar parenchymal mineralization involving the parieto-occipital lobes (right greater than left), basal ganglia, precentral gyri, and hippocampi. 4. Right maxillary sinus mucosal thickening.  Electronically signed by: Donnice Mania MD 07/28/2024 03:53 PM EDT RP Workstation: [id number]    I personally reviewed recent imaging.   Palliative Care Assessment and Plan Summary of Established Goals of Care and Medical Treatment Preferences    # Complex medical decision making/goals of care  - Family has discussed and limit of care at this point in time is that she would not want prolonged mechanical ventilation if she reaches a point requiring intubation.  She is a full code in the event of cardiac or respiratory arrest.  - Family also state they do not think that she would want to have a long-term feeding tube.  Hope is that her intake will improve over the weekend.  We discussed that if she does not appear to reach a point where she is able to maintain her nutrition and hydration (which was a large factor of why she came into the hospital) we will need to have further goals of care conversation regarding what care plan will look like moving forward.  - Daughter reports that family has discussed her being healthcare power of attorney.  Assess early next week to see if it appears patient has capacity to complete paperwork.  From a legal standpoint at this point in time, patient's daughter and 2 sons would work together for  publishing copy.  -  Code Status: Full Code  Prognosis: Unable to determine-as will depend upon her continue clinical course  # Symptom management Patient is receiving these palliative interventions for symptom management with an intent to improve quality of life.   - Pain: Acetaminophen  as needed  - Constipation: MiraLAX as needed  # Psycho-social/Spiritual Support:  - Support System: Family including 2 sons and a daughter - Desire for further Chaplain support:yes  # Discharge Planning:  To Be Determined  Thank you for allowing the palliative care team to participate in the care Naomie JONETTA Muskrat.  Amaryllis Meissner, MD Palliative Care Provider PMT # [phone]  If patient remains symptomatic despite maximum doses, please call PMT at [phone] between 0700 and 1900. Outside of these hours, please call attending, as PMT does not have night coverage.   I personally spent a total of 90 minutes in the care of the patient today including preparing to see the patient, getting/reviewing separately obtained history, performing a medically appropriate exam/evaluation, counseling and educating, referring and communicating with other health care professionals, documenting clinical information in the EHR, and independently interpreting results.

## 2024-07-30 NOTE — Progress Notes (Signed)
 PROGRESS NOTE    Danielle Vaughan  FMW:[id number] DOB: 1933/05/08 DOA: 07/28/2024 PCP: [name], MD   Chief Complaint  Patient presents with   Weakness    Brief Narrative:   89 year old female with past medical history of stroke, hypertension, hyperlipidemia, pre-diabetes, CKD II, partial seizures, dementia with baseline orientation to self and place presents to ED with stroke like symptoms.    Reportedly, patient arriving from her doctor's office after she was noted to have stroke-like symptoms with left sided weakness and confusion above her baseline for about 1 week. She had a fall 9 days prior and has had weakness with ambulating since. Additionally, family reports she is being treated for yeast infection with fluconazole.   In ED, afebrile, WBC 10.3, hgb 11.2, plt 543. Notably sodium 119*, K 4.8, Cl 86, bicarb 18, serum osmolality 252. CT head with no acute findings. Neurology was consulted and recommended MRI f/u. Additionally, patient started on 3% saline @ 50cc/hr. CCM consulted for admission.    Baseline per family is she does not always know answers to orientation questions. She sometimes hallucinates. She recently has been chewing rather than swallowing pills and blowing into a straw rather than drinking out of it.   Assessment & Plan:   Principal Problem:   Acute hyponatremia  Hypotonic hyponatremia - This is most likely due to to poor oral intake. - Manage initially in ICU, requiring hypertonic saline. - DM 119 admission, it is 125 today - Even though his urine sodium> 20, she does appear with clinical dehydration so we will start on IV fluids. - Will start on salt tab and encourage oral intake. - Will add Ensure. - TSH 0.472, cortisol 19.3  Acute metabolic encephalopathy with underlying significant baseline dementia -Multifactorial factorial, in the setting of UTI, hyponatremia.  With significant underlying dementia - MRI with significant motion  degradation  Hyperlipidemia  Hypertension  - Blood pressure on the lower side, so we will continue to hold home meds goading amlodipine, metoprolol and benazepril   UTI -Continue with Rocephin   Paroxysmal SVT  - IV metoprolol ER and - tele - optimize electrolytes    Prediabetes  -  Ha1c 5.3  Remote h/o partial seizures; not on AEDs Dementia  History of occipital stroke  -Tried Namenda but was dizzy so stopped in the past - cont ASA  - delirium precautions        DVT prophylaxis: (hEPARIN Code Status: (Full) Family Communication: (Discussed with son at bedside) Disposition:   Status is: Inpatient    Consultants:  None   Subjective:  Patient somnolent this morning, son at bedside reports she still with poor appetite  Objective: Vitals:   07/30/24 0600 07/30/24 0700 07/30/24 0800 07/30/24 1100  BP:  97/63 109/67   Pulse: (!) 107  95   Resp: 18  (!) 25   Temp: 100.1 F (37.8 C) 98.8 F (37.1 C)  98.6 F (37 C)  TempSrc: Axillary Oral  Oral  SpO2: 96%  98%   Weight:      Height:        Intake/Output Summary (Last 24 hours) at 07/30/2024 1330 Last data filed at 07/30/2024 0121 Gross per 24 hour  Intake --  Output 200 ml  Net -200 ml   Filed Weights   07/28/24 1514 07/29/24 0410 07/30/24 0500  Weight: 56.2 kg 51.1 kg 51.5 kg    Examination:  Seated comfortably, no apparent distress, chronically ill-appearing Good air entry bilaterally RRR,No Gallops,Rubs or  new Murmurs, No Parasternal Heave +ve B.Sounds, Abd Soft No Cyanosis, Clubbing or edema, No new Rash or bruise       Data Reviewed: I have personally reviewed following labs and imaging studies  CBC: Recent Labs  Lab 07/28/24 1528 07/28/24 1533 07/29/24 1048  WBC 10.3  --  9.1  NEUTROABS 7.1  --   --   HGB 11.2* 11.6* 12.3  HCT 31.6* 34.0* 35.8*  MCV 88.3  --  89.1  PLT 543*  --  542*    Basic Metabolic Panel: Recent Labs  Lab 07/28/24 1528 07/28/24 1533  07/28/24 1700 07/29/24 0228 07/29/24 0817 07/29/24 1711 07/29/24 2247 07/30/24 0548  NA 119* 118*   < > 126* 127* 127* 125* 125*  K 4.8 4.6  --   --  4.3  --   --  4.0  CL 86* 87*  --   --  90*  --   --  90*  CO2 18*  --   --   --  20*  --   --  20*  GLUCOSE 111* 111*  --   --  89  --   --  111*  BUN 22 25*  --   --  12  --   --  12  CREATININE 0.86 1.00  --   --  0.58  --   --  0.71  CALCIUM 8.4*  --   --   --  8.7*  --   --  7.9*  MG  --   --   --   --  1.8  --   --  2.1  PHOS  --   --   --   --  3.7  --   --  3.4   < > = values in this interval not displayed.    GFR: Estimated Creatinine Clearance: 36 mL/min (by C-G formula based on SCr of 0.71 mg/dL).  Liver Function Tests: Recent Labs  Lab 07/28/24 1528  AST 22  ALT 13  ALKPHOS 55  BILITOT 0.8  PROT 6.7  ALBUMIN 3.0*    CBG: Recent Labs  Lab 07/28/24 2032 07/28/24 2145 07/28/24 2326 07/29/24 0317 07/29/24 0746  GLUCAP 117* 109* 111* 94 83     Recent Results (from the past 240 hours)  MRSA Next Gen by PCR, Nasal     Status: None   Collection Time: 07/28/24  7:02 PM   Specimen: Nasal Mucosa; Nasal Swab  Result Value Ref Range Status   MRSA by PCR Next Gen NOT DETECTED NOT DETECTED Final    Comment: (NOTE) The GeneXpert MRSA Assay (FDA approved for NASAL specimens only), is one component of a comprehensive MRSA colonization surveillance program. It is not intended to diagnose MRSA infection nor to guide or monitor treatment for MRSA infections. Test performance is not FDA approved in patients less than 40 years old. Performed at [facility], [address] [place], [place] [address]   Culture, blood (Routine X 2) w Reflex to ID Panel     Status: None (Preliminary result)   Collection Time: 07/28/24  8:25 PM   Specimen: BLOOD LEFT FOREARM  Result Value Ref Range Status   Specimen Description BLOOD LEFT FOREARM  Final   Special Requests   Final    BOTTLES DRAWN AEROBIC AND ANAEROBIC Blood  Culture results may not be optimal due to an inadequate volume of blood received in culture bottles   Culture   Final  NO GROWTH 2 DAYS Performed at [facility], [address], [place], [place] [address]    Report Status PENDING  Incomplete  Culture, blood (Routine X 2) w Reflex to ID Panel     Status: None (Preliminary result)   Collection Time: 07/28/24  8:35 PM   Specimen: BLOOD  Result Value Ref Range Status   Specimen Description BLOOD LEFT ANTECUBITAL  Final   Special Requests   Final    BOTTLES DRAWN AEROBIC AND ANAEROBIC Blood Culture results may not be optimal due to an inadequate volume of blood received in culture bottles   Culture   Final    NO GROWTH 2 DAYS Performed at [facility], [address] [place], [place] [address]    Report Status PENDING  Incomplete         Radiology Studies: CT CERVICAL SPINE WO CONTRAST Result Date: 07/28/2024 EXAM: CT CERVICAL SPINE WITHOUT CONTRAST 07/28/2024 11:20:10 PM TECHNIQUE: CT of the cervical spine was performed without the administration of intravenous contrast. Multiplanar reformatted images are provided for review. Automated exposure control, iterative reconstruction, and/or weight based adjustment of the mA/kV was utilized to reduce the radiation dose to as low as reasonably achievable. COMPARISON: 12/12/2023 CLINICAL HISTORY: Cervical radiculopathy, infection suspected, no prior imaging; fall. FINDINGS: CERVICAL SPINE: BONES AND ALIGNMENT: No acute fracture or traumatic malalignment. DEGENERATIVE CHANGES: Diffuse multilevel degenerative disc and facet disease. SOFT TISSUES: No prevertebral soft tissue swelling. IMPRESSION: 1. No acute abnormality of the cervical spine. 2. Diffuse multilevel degenerative disc and facet disease. Electronically signed by: Franky Crease MD 07/28/2024 11:27 PM EDT RP Workstation: [id number]   MR BRAIN WO CONTRAST Result Date: 07/28/2024 EXAM: MRI BRAIN WITHOUT CONTRAST 07/28/2024  09:15:27 PM TECHNIQUE: Multiplanar multisequence MRI of the head/brain was performed without the administration of intravenous contrast. Examination severely limited as the patient was unable to tolerate the full length of the exam. Additionally, provided images are severely degraded by motion artifact. COMPARISON: Comparison made with CT from earlier the same day as well as previous MRI from 08/03/2023. CLINICAL HISTORY: FINDINGS: BRAIN AND VENTRICLES: Examination severely limited as the patient was unable to tolerate the full length of the exam. Additionally, provided images are severely degraded by motion artifact. Age related subluxation. Moderate to advanced chronic microvascular ischemic disease. Chronic encephalomalacia at the right occipital lobe, grossly similar. There are scattered foci of diffusion signal abnormality involving the posterior frontal, parietal, and occipital lobes (series 2, image 43, 39, 35, 30, 25, 19). While these findings are suspected to at least in part be artifactual in nature due to the underlying chronic encephalomalacia and mineralization at these sites, possible superimposed ischemic infarcts could be present as well. No visible associated hemorrhage or mass effect. No other obvious acute intracranial abnormality. No hydrocephalus. No extravasated fluid collection. No visible mass lesion. No midline shift. The sella is unremarkable. Major intracranial vascular flow voids are not well assessed on this limited exam. Craniocervical junction not well assessed on this limited exam. ORBITS: Globes and orbital soft tissues not well assessed on this limited exam. SINUSES AND MASTOIDS: Right maxillary sinus retention cyst. Paranasal sinuses are otherwise grossly clear. BONES AND SOFT TISSUES: Normal marrow signal. No acute soft tissue abnormality. IMPRESSION: 1. Severely limited examination due to patient intolerance and motion artifact. 2. Scattered foci of diffusion signal abnormality  in the posterior frontal, parietal, and occipital lobes as above. While these findings likely at least in partartifactual in nature from he underlying chronic  hanges and mineralization n these regions possible superimposed ischemic infarcts ay be present as well. No visible associated hemorrhage or mass effect. 3. Underlying atrophy with chronic microvascular ischemic disease, with chronic right occipital encephalomalacia. Electronically signed by: Morene Hoard MD 07/28/2024 10:47 PM EDT RP Workstation: [id number]   CT HEAD WO CONTRAST Result Date: 07/28/2024 EXAM: CT HEAD WITHOUT CONTRAST 07/28/2024 03:42:35 PM TECHNIQUE: CT of the head was performed without the administration of intravenous contrast. Automated exposure control, iterative reconstruction, and/or weight based adjustment of the mA/kV was utilized to reduce the radiation dose to as low as reasonably achievable. COMPARISON: CT head 12/12/2023. CLINICAL HISTORY: Neuro deficit, acute, stroke suspected; fall. Pt BIB [facility] coming from doctor's office called out for stroke like symptoms. AMS at baseline. Family says she had a fall 9 days ago and has not been able to walk without a walker since. Symptoms began 9 days ago reported by family. Patient also ; currently being treated for UTI. FINDINGS: BRAIN AND VENTRICLES: No acute intracranial hemorrhage. No evidence of acute infarct. No hydrocephalus. No extra-axial collection. No mass effect or midline shift. Similar appearance of prominent mineralization of the occipital lobes, right greater than left, with additional mineralization extending into the inferior aspect of the parietal lobes. Similar mild mineralization involving the basal ganglia. Mild mineralization along the bilateral precentral gyri. Similar mineralization of the hippocampi. Extensive chronic microvascular ischemic changes. Similar parenchymal volume loss. Atherosclerosis of the carotid siphons. Minimal limitations of motion  artifact and streak artifact. ORBITS: Bilateral lens replacement. SINUSES: Mucosal thickening in the right maxillary sinus. SOFT TISSUES AND SKULL: Subgaleal on the right. No significantly increased areas of mineralization. No skull fracture. IMPRESSION: 1. No acute intracranial abnormality. 2. Extensive chronic microvascular ischemic changes. 3. Similar parenchymal mineralization involving the parieto-occipital lobes (right greater than left), basal ganglia, precentral gyri, and hippocampi. 4. Right maxillary sinus mucosal thickening. Electronically signed by: Donnice Mania MD 07/28/2024 03:53 PM EDT RP Workstation: [id number]        Scheduled Meds:  Chlorhexidine Gluconate Cloth  6 each Topical Daily   feeding supplement  237 mL Oral BID BM   fluconazole  150 mg Oral Daily   heparin  5,000 Units Subcutaneous Q8H   sodium chloride  2 g Oral TID WC   Continuous Infusions:  cefTRIAXone (ROCEPHIN)  IV 1 g (07/29/24 2259)     LOS: 2 days       Brayton Lye, MD [facility]   To contact the attending provider between 7A-7P or the covering provider during after hours 7P-7A, please log into the web site [email or web address] and access using universal [place] password for that web site. If you do not have the password, please call the hospital operator.  07/30/2024, 1:30 PM

## 2024-07-30 NOTE — Evaluation (Signed)
 Physical Therapy Evaluation Patient Details Name: Danielle Vaughan MRN: [id number] DOB: Dec 22, 1932 Today's Date: 07/30/2024  History of Present Illness  89 year old female adm 10/29 with past medical history of stroke, hypertension, hyperlipidemia, pre-diabetes, CKD II, partial seizures, dementia with baseline orientation to self and place presents to ED with stroke like symptoms.  MRI Brain:  Scattered foci of diffusion signal abnormality in the posterior frontal,  parietal, and occipital lobes as above. While these findings likely at least in  partartifactual in nature from he underlying chronic hanges and mineralization  n these regions possible superimposed ischemic infarcts ay be present.  Clinical Impression  Pt presents with admitting diagnosis above. Co-treat with OT. Pt today required +2 total A for all mobility today. Per son in room, pt had previously been ambulatory with RW however pt had a fall 2 weeks ago and has essentially been bedbound since with family transferring her out of a lift chair. Patient will benefit from continued inpatient follow up therapy, <3 hours/day. PT will continue to follow.        If plan is discharge home, recommend the following: Two people to help with walking and/or transfers;Two people to help with bathing/dressing/bathroom;Direct supervision/assist for medications management;Direct supervision/assist for financial management;Assist for transportation;Help with stairs or ramp for entrance;Supervision due to cognitive status   Can travel by private vehicle   No    Equipment Recommendations Hoyer lift;Hospital bed  Recommendations for Other Services       Functional Status Assessment Patient has had a recent decline in their functional status and demonstrates the ability to make significant improvements in function in a reasonable and predictable amount of time.     Precautions / Restrictions Precautions Precautions: Fall Recall of  Precautions/Restrictions: Impaired Restrictions Weight Bearing Restrictions Per Provider Order: No      Mobility  Bed Mobility Overal bed mobility: Needs Assistance Bed Mobility: Supine to Sit     Supine to sit: Total assist, +2 for physical assistance          Transfers Overall transfer level: Needs assistance   Transfers: Sit to/from Stand, Bed to chair/wheelchair/BSC Sit to Stand: Total assist, +2 physical assistance Stand pivot transfers: Total assist, +2 physical assistance         General transfer comment: Total assist for all mobility    Ambulation/Gait               General Gait Details: unable  Stairs            Wheelchair Mobility     Tilt Bed    Modified Rankin (Stroke Patients Only)       Balance Overall balance assessment: Needs assistance Sitting-balance support: Feet supported Sitting balance-Leahy Scale: Poor     Standing balance support: Bilateral upper extremity supported Standing balance-Leahy Scale: Zero                               Pertinent Vitals/Pain Pain Assessment Pain Assessment: Faces Faces Pain Scale: Hurts even more Pain Location: L arm and B legs with movement Pain Descriptors / Indicators: Discomfort, Grimacing, Guarding    Home Living Family/patient expects to be discharged to:: Private residence Living Arrangements: Children Available Help at Discharge: Family;Available 24 hours/day Type of Home: House Home Access: Stairs to enter   [employer] of Steps: 3-4   Home Layout: One level Home Equipment: [employer] (2 wheels);Wheelchair - manual      Prior  Function Prior Level of Function : Needs assist             Mobility Comments: Per family in room, pt hasnt been ambulating much however uses RW short distances with family assistance. WC in community ADLs Comments: Family assistance bed level     Extremity/Trunk Assessment   Upper Extremity Assessment Upper  Extremity Assessment: Generalized weakness LUE Deficits / Details: red and painful, multiple IV sites LUE Sensation: WNL LUE Coordination: WNL    Lower Extremity Assessment Lower Extremity Assessment: Generalized weakness    Cervical / Trunk Assessment Cervical / Trunk Assessment: Kyphotic  Communication   Communication Communication: Impaired Factors Affecting Communication: Difficulty expressing self    Cognition Arousal: Lethargic Behavior During Therapy: Restless, Flat affect                             Following commands: Intact       Cueing Cueing Techniques: Verbal cues     General Comments General comments (skin integrity, edema, etc.): VSS    Exercises     Assessment/Plan    PT Assessment Patient needs continued PT services  PT Problem List Decreased strength;Decreased range of motion;Decreased activity tolerance;Decreased mobility;Decreased balance;Decreased coordination;Decreased knowledge of use of DME;Decreased cognition;Decreased safety awareness;Decreased knowledge of precautions;Cardiopulmonary status limiting activity       PT Treatment Interventions DME instruction;Gait training;Stair training;Functional mobility training;Therapeutic activities;Therapeutic exercise;Balance training;Neuromuscular re-education;Cognitive remediation;Patient/family education    PT Goals (Current goals can be found in the Care Plan section)  Acute Rehab PT Goals PT Goal Formulation: Patient unable to participate in goal setting Time For Goal Achievement: 08/13/24 Potential to Achieve Goals: Fair    Frequency Min 2X/week     Co-evaluation               AM-PAC PT 6 Clicks Mobility  Outcome Measure Help needed turning from your back to your side while in a flat bed without using bedrails?: Total Help needed moving from lying on your back to sitting on the side of a flat bed without using bedrails?: Total Help needed moving to and from a bed to a  chair (including a wheelchair)?: Total Help needed standing up from a chair using your arms (e.g., wheelchair or bedside chair)?: Total Help needed to walk in hospital room?: Total Help needed climbing 3-5 steps with a railing? : Total 6 Click Score: 6    End of Session Equipment Utilized During Treatment: Gait belt Activity Tolerance: Patient tolerated treatment well;Patient limited by pain;Patient limited by fatigue Patient left: in chair;with call bell/phone within reach;with chair alarm set;with family/visitor present;Other (comment) (Lift pad placed under) Nurse Communication: Mobility status;Need for lift equipment PT Visit Diagnosis: Other abnormalities of gait and mobility (R26.89)    Time: 8595-8569 PT Time Calculation (min) (ACUTE ONLY): 26 min   Charges:   PT Evaluation $PT Eval Moderate Complexity: 1 Mod   PT General Charges $$ ACUTE PT VISIT: 1 Visit         Caprisha Bridgett B, PT, DPT Acute Rehab Services [phone]   Trystin Terhune 07/30/2024, 3:13 PM

## 2024-07-30 NOTE — Evaluation (Signed)
 Occupational Therapy Evaluation Patient Details Name: Danielle Vaughan MRN: [id number] DOB: 01/17/1933 Today's Date: 07/30/2024   History of Present Illness   89 year old female adm 10/29 with past medical history of stroke, hypertension, hyperlipidemia, pre-diabetes, CKD II, partial seizures, dementia with baseline orientation to self and place presents to ED with stroke like symptoms.  MRI Brain:  Scattered foci of diffusion signal abnormality in the posterior frontal,  parietal, and occipital lobes as above. While these findings likely at least in  partartifactual in nature from he underlying chronic hanges and mineralization  n these regions possible superimposed ischemic infarcts ay be present.     Clinical Impressions Patient admitted for the diagnosis above.  PTA she lived at home with her son and DIL.  Patient since a recent fall has needed more assist with transfers and ADL completion, mainly from bedlevel.  Currently she presents with the deficits listed below, needing Total A of 2 for transfers and lower body ADL at bedlevel.  Continue efforts in the acute setting to address deficits, and Patient will benefit from continued inpatient follow up therapy, <3 hours/day.     If plan is discharge home, recommend the following:   Two people to help with walking and/or transfers;A lot of help with bathing/dressing/bathroom     Functional Status Assessment   Patient has had a recent decline in their functional status and demonstrates the ability to make significant improvements in function in a reasonable and predictable amount of time.     Equipment Recommendations   None recommended by OT     Recommendations for Other Services         Precautions/Restrictions   Precautions Precautions: Fall Recall of Precautions/Restrictions: Impaired Restrictions Weight Bearing Restrictions Per Provider Order: No     Mobility Bed Mobility Overal bed mobility: Needs  Assistance Bed Mobility: Supine to Sit     Supine to sit: Total assist, +2 for physical assistance          Transfers Overall transfer level: Needs assistance   Transfers: Sit to/from Stand, Bed to chair/wheelchair/BSC Sit to Stand: Total assist, +2 physical assistance Stand pivot transfers: Total assist, +2 physical assistance                Balance Overall balance assessment: Needs assistance Sitting-balance support: Feet supported Sitting balance-Leahy Scale: Poor     Standing balance support: Bilateral upper extremity supported Standing balance-Leahy Scale: Zero                             ADL either performed or assessed with clinical judgement   ADL Overall ADL's : Needs assistance/impaired Eating/Feeding: Moderate assistance;Sitting   Grooming: Moderate assistance;Sitting   Upper Body Bathing: Maximal assistance;Bed level   Lower Body Bathing: Total assistance;Bed level   Upper Body Dressing : Maximal assistance;Bed level   Lower Body Dressing: Total assistance;Bed level   Toilet Transfer: Total assistance;+2 for physical assistance;Stand-pivot;BSC/3in1                   Vision Ability to See in Adequate Light: 1 Impaired Patient Visual Report: No change from baseline       Perception Perception: Not tested       Praxis Praxis: Not tested       Pertinent Vitals/Pain Pain Assessment Pain Assessment: Faces Faces Pain Scale: Hurts even more Pain Location: L arm and B legs with movement Pain Descriptors / Indicators: Discomfort, Grimacing, Guarding Pain  Intervention(s): Monitored during session     Extremity/Trunk Assessment Upper Extremity Assessment Upper Extremity Assessment: Generalized weakness;Right hand dominant;LUE deficits/detail LUE Deficits / Details: red and painful, multiple IV sites LUE Sensation: WNL LUE Coordination: WNL   Lower Extremity Assessment Lower Extremity Assessment: Defer to PT  evaluation   Cervical / Trunk Assessment Cervical / Trunk Assessment: Kyphotic   Communication Communication Communication: Impaired Factors Affecting Communication: Difficulty expressing self   Cognition Arousal: Lethargic Behavior During Therapy: Restless, Flat affect Cognition: History of cognitive impairments                               Following commands: Intact       Cueing  General Comments   Cueing Techniques: Verbal cues      Exercises     Shoulder Instructions      Home Living Family/patient expects to be discharged to:: Private residence Living Arrangements: Children Available Help at Discharge: Family;Available 24 hours/day Type of Home: House Home Access: Stairs to enter [employer] of Steps: 3-4   Home Layout: One level     Bathroom Shower/Tub: Tub/shower unit;Walk-in shower   Bathroom Toilet: Standard Bathroom Accessibility: Yes How Accessible: Accessible via walker Home Equipment: Rolling Walker (2 wheels);Wheelchair - manual          Prior Functioning/Environment Prior Level of Function : Needs assist             Mobility Comments: Per family in room, pt hasnt been ambulating much however uses RW short distances with family assistance. WC in community ADLs Comments: Family assistance bed level    OT Problem List: Decreased strength;Decreased activity tolerance;Impaired balance (sitting and/or standing);Decreased safety awareness;Decreased cognition;Impaired vision/perception;Pain   OT Treatment/Interventions: Self-care/ADL training;Balance training;Therapeutic activities;DME and/or AE instruction      OT Goals(Current goals can be found in the care plan section)   Acute Rehab OT Goals OT Goal Formulation: Patient unable to participate in goal setting Time For Goal Achievement: 08/13/24 Potential to Achieve Goals: Fair ADL Goals Pt Will Perform Eating: with set-up;sitting Pt Will Perform Grooming:  with set-up;sitting Pt Will Perform Upper Body Dressing: with min assist;sitting Pt Will Transfer to Toilet: with min assist;with +2 assist;ambulating;bedside commode   OT Frequency:  Min 2X/week    Co-evaluation              AM-PAC OT 6 Clicks Daily Activity     Outcome Measure Help from another person eating meals?: A Lot Help from another person taking care of personal grooming?: A Lot Help from another person toileting, which includes using toliet, bedpan, or urinal?: A Lot Help from another person bathing (including washing, rinsing, drying)?: Total Help from another person to put on and taking off regular upper body clothing?: A Lot Help from another person to put on and taking off regular lower body clothing?: Total 6 Click Score: 10   End of Session Equipment Utilized During Treatment: Gait belt Nurse Communication: Mobility status;Need for lift equipment  Activity Tolerance: Patient limited by pain Patient left: in chair;with call bell/phone within reach;with chair alarm set  OT Visit Diagnosis: Unsteadiness on feet (R26.81);Muscle weakness (generalized) (M62.81);History of falling (Z91.81);Pain Pain - Right/Left: Left Pain - part of body: Arm                Time: 1413-1435 OT Time Calculation (min): 22 min Charges:  OT General Charges $OT Visit: 1 Visit OT Evaluation $OT Eval Moderate  Complexity: 1 Mod  07/30/2024  RP, OTR/L  Acute Rehabilitation Services  Office:  [phone]   Charlie JONETTA Halsted 07/30/2024, 2:55 PM

## 2024-07-30 NOTE — TOC Initial Note (Signed)
 Transition of Care [facility]) - Initial/Assessment Note    Patient Details  Name: Danielle Vaughan MRN: [id number] Date of Birth: November 11, 1932  Transition of Care [facility]) CM/SW Contact:    Inocente GORMAN Kindle, LCSW Phone Number: 07/30/2024, 5:20 PM  Clinical Narrative:                 Patient admitted from home for metabolic encephalopathy with dementia at baseline. CSW continuing to follow for disposition needs once medically stable.     Barriers to Discharge: Continued Medical Work up   Patient Goals and CMS Choice            Expected Discharge Plan and Services In-house Referral: Clinical Social Work     Living arrangements for the past 2 months: Single Family Home                                      Prior Living Arrangements/Services Living arrangements for the past 2 months: Single Family Home Lives with:: Adult Children Patient language and need for interpreter reviewed:: Yes Do you feel safe going back to the place where you live?: Yes      Need for Family Participation in Patient Care: Yes (Comment) Care giver support system in place?: Yes (comment)   Criminal Activity/Legal Involvement Pertinent to Current Situation/Hospitalization: No - Comment as needed  Activities of Daily Living   ADL Screening (condition at time of admission) Independently performs ADLs?: No Does the patient have a NEW difficulty with bathing/dressing/toileting/self-feeding that is expected to last >3 days?: Yes (Initiates electronic notice to provider for possible OT consult) Does the patient have a NEW difficulty with getting in/out of bed, walking, or climbing stairs that is expected to last >3 days?: Yes (Initiates electronic notice to provider for possible PT consult) Does the patient have a NEW difficulty with communication that is expected to last >3 days?: Yes (Initiates electronic notice to provider for possible SLP consult) Is the patient deaf or have difficulty hearing?: No Does  the patient have difficulty seeing, even when wearing glasses/contacts?: No Does the patient have difficulty concentrating, remembering, or making decisions?: Yes  Permission Sought/Granted   Permission granted to share information with : No              Emotional Assessment Appearance:: Appears stated age Attitude/Demeanor/Rapport: Unable to Assess Affect (typically observed): Unable to Assess Orientation: : Oriented to Self Alcohol / Substance Use: Not Applicable Psych Involvement: No (comment)  Admission diagnosis:  Acute hyponatremia [E87.1] Hyponatremia [E87.1] Weakness [R53.1] Patient Active Problem List   Diagnosis Date Noted   Acute hyponatremia 07/28/2024   Dementia (HCC) 03/06/2023   Partial seizures (HCC) 11/29/2021   Abnormal glucose 11/29/2021   Abnormal findings on diagnostic imaging of other specified body structures 11/29/2021   Memory loss 11/29/2021   NSVT (nonsustained ventricular tachycardia) (HCC) 06/14/2020   Paroxysmal SVT (supraventricular tachycardia) 06/14/2020   Frequent PVCs 06/14/2020   History of CVA (cerebrovascular accident) 01/27/2020   Occipital stroke (HCC) 12/08/2019   Visual changes 12/08/2019   Right hand weakness 11/15/2019   Abnormal CT of the head 11/15/2019   PCP:  [name], MD Pharmacy:   [id number] - [place], [place] - [address] AT [facility] [address] [place] [address] Phone: [phone] Fax: [phone]  [facility] [place], [place] [address] [place] [address] Phone: [phone] Fax: [phone]     Social Drivers of Health (SDOH) Social History: SDOH Screenings   Food Insecurity: No Food Insecurity (07/29/2024)  Housing: Low Risk  (07/29/2024)  Recent Concern: Housing - High Risk (07/29/2024)  Transportation Needs: No Transportation Needs (07/29/2024)  Utilities: Not  At Risk (07/29/2024)  Alcohol Screen: Low Risk  (07/29/2024)  Depression (PHQ2-9): Low Risk  (07/29/2024)  Financial Resource Strain: Low Risk  (07/29/2024)  Physical Activity: Inactive (07/29/2024)  Social Connections: Moderately Integrated (07/29/2024)  Stress: No Stress Concern Present (07/29/2024)  Tobacco Use: Low Risk  (07/29/2024)  Health Literacy: Inadequate Health Literacy (07/29/2024)   SDOH Interventions: Food Insecurity Interventions: Intervention Not Indicated Housing Interventions: Intervention Not Indicated Transportation Interventions: Intervention Not Indicated Utilities Interventions: Intervention Not Indicated Alcohol Usage Interventions: Intervention Not Indicated (Score <7) Financial Strain Interventions: Intervention Not Indicated Physical Activity Interventions: Intervention Not Indicated Stress Interventions: Intervention Not Indicated Social Connections Interventions: Intervention Not Indicated Health Literacy Interventions: Intervention Not Indicated   Readmission Risk Interventions     No data to display

## 2024-07-31 ENCOUNTER — Inpatient Hospital Stay (HOSPITAL_COMMUNITY)

## 2024-07-31 DIAGNOSIS — R29722 NIHSS score 22: Secondary | ICD-10-CM

## 2024-07-31 DIAGNOSIS — F0390 Unspecified dementia without behavioral disturbance: Secondary | ICD-10-CM

## 2024-07-31 DIAGNOSIS — E871 Hypo-osmolality and hyponatremia: Secondary | ICD-10-CM | POA: Diagnosis not present

## 2024-07-31 DIAGNOSIS — I6340 Cerebral infarction due to embolism of unspecified cerebral artery: Secondary | ICD-10-CM

## 2024-07-31 DIAGNOSIS — I69354 Hemiplegia and hemiparesis following cerebral infarction affecting left non-dominant side: Secondary | ICD-10-CM | POA: Diagnosis not present

## 2024-07-31 LAB — CBC
HCT: 27.4 % — ABNORMAL LOW (ref 36.0–46.0)
Hemoglobin: 9.6 g/dL — ABNORMAL LOW (ref 12.0–15.0)
MCH: 31 pg (ref 26.0–34.0)
MCHC: 35 g/dL (ref 30.0–36.0)
MCV: 88.4 fL (ref 80.0–100.0)
Platelets: 552 K/uL — ABNORMAL HIGH (ref 150–400)
RBC: 3.1 MIL/uL — ABNORMAL LOW (ref 3.87–5.11)
RDW: 16.8 % — ABNORMAL HIGH (ref 11.5–15.5)
WBC: 12 K/uL — ABNORMAL HIGH (ref 4.0–10.5)
nRBC: 0 % (ref 0.0–0.2)

## 2024-07-31 LAB — BASIC METABOLIC PANEL WITH GFR
Anion gap: 11 (ref 5–15)
Anion gap: 10 (ref 5–15)
BUN: 12 mg/dL (ref 8–23)
BUN: 22 mg/dL (ref 8–23)
CO2: 21 mmol/L — ABNORMAL LOW (ref 22–32)
CO2: 20 mmol/L — ABNORMAL LOW (ref 22–32)
Calcium: 7.7 mg/dL — ABNORMAL LOW (ref 8.9–10.3)
Calcium: 7.6 mg/dL — ABNORMAL LOW (ref 8.9–10.3)
Chloride: 90 mmol/L — ABNORMAL LOW (ref 98–111)
Chloride: 91 mmol/L — ABNORMAL LOW (ref 98–111)
Creatinine, Ser: 0.59 mg/dL (ref 0.44–1.00)
Creatinine, Ser: 0.58 mg/dL (ref 0.44–1.00)
GFR, Estimated: >60 mL/min (ref >60)
GFR, Estimated: >60 mL/min (ref >60)
Glucose, Bld: 109 mg/dL — ABNORMAL HIGH (ref 70–99)
Glucose, Bld: 156 mg/dL — ABNORMAL HIGH (ref 70–99)
Potassium: 4 mmol/L (ref 3.5–5.1)
Potassium: 3.9 mmol/L (ref 3.5–5.1)
Sodium: 122 mmol/L — ABNORMAL LOW (ref 135–145)
Sodium: 121 mmol/L — ABNORMAL LOW (ref 135–145)

## 2024-07-31 LAB — SODIUM: Sodium: 122 mmol/L — ABNORMAL LOW (ref 135–145)

## 2024-07-31 MED ORDER — UREA 15 G PO PACK
15.0000 g | PACK | Freq: Two times a day (BID) | ORAL | Status: DC
Start: 2024-07-31 — End: 2024-07-31
  Administered 2024-07-31: 15 g via ORAL
  Filled 2024-07-31 (×3): qty 1

## 2024-07-31 MED ORDER — CYANOCOBALAMIN 1000 MCG/ML IJ SOLN
1000.0000 ug | Freq: Every day | INTRAMUSCULAR | Status: DC
  Administered 2024-07-31 – 2024-08-05 (×6): 1000 ug via SUBCUTANEOUS
  Filled 2024-07-31 (×6): qty 1

## 2024-07-31 MED ORDER — MIDODRINE HCL 5 MG PO TABS
5.0000 mg | ORAL_TABLET | Freq: Three times a day (TID) | ORAL | Status: DC
  Administered 2024-07-31 – 2024-08-01 (×3): 5 mg via ORAL
  Filled 2024-07-31 (×2): qty 1

## 2024-07-31 MED ORDER — LORAZEPAM 2 MG/ML IJ SOLN
0.5000 mg | Freq: Once | INTRAMUSCULAR | Status: AC | PRN
  Administered 2024-07-31: 0.5 mg via INTRAVENOUS
  Filled 2024-07-31: qty 1

## 2024-07-31 MED ORDER — METOPROLOL TARTRATE 12.5 MG HALF TABLET
12.5000 mg | ORAL_TABLET | Freq: Four times a day (QID) | ORAL | Status: DC
  Administered 2024-07-31 – 2024-08-02 (×6): 12.5 mg via ORAL
  Filled 2024-07-31 (×8): qty 1

## 2024-07-31 NOTE — Plan of Care (Signed)

## 2024-07-31 NOTE — Progress Notes (Signed)
 PROGRESS NOTE    Danielle Vaughan  FMW:[id number] DOB: 09-08-1933 DOA: 07/28/2024 PCP: [name], MD   Chief Complaint  Patient presents with   Weakness    Brief Narrative:   89 year old female with past medical history of stroke, hypertension, hyperlipidemia, pre-diabetes, CKD II, partial seizures, dementia with baseline orientation to self and place presents to ED with stroke like symptoms.    Reportedly, patient arriving from her doctor's office after she was noted to have stroke-like symptoms with left sided weakness and confusion above her baseline for about 1 week. She had a fall 9 days prior and has had weakness with ambulating since. Additionally, family reports she is being treated for yeast infection with fluconazole.   In ED, afebrile, WBC 10.3, hgb 11.2, plt 543. Notably sodium 119*, K 4.8, Cl 86, bicarb 18, serum osmolality 252. CT head with no acute findings. Neurology was consulted and recommended MRI f/u. Additionally, patient started on 3% saline @ 50cc/hr. CCM consulted for admission.    Baseline per family is she does not always know answers to orientation questions. She sometimes hallucinates. She recently has been chewing rather than swallowing pills and blowing into a straw rather than drinking out of it.   Assessment & Plan:   Principal Problem:   Acute hyponatremia  Hypotonic hyponatremia - This is most likely due to to poor oral intake. - Managed initially in ICU, requiring hypertonic saline.  Proved from 119-127 initially - Even though his urine sodium> 20, labs concerning for SIADH, she does appear with clinical dehydration so she was started on IV NS overnight, sodium is low again at 122 today, so we will DC IV fluids, especially she is more awake today and hopefully she will be able to take more oral intake - On salt tabs, will add urea packets given sodium trending down - Will add Ensure. - TSH 0.472, cortisol 19.3 - Will request renal  assistance given sodium is trending down again  Acute metabolic encephalopathy with underlying significant baseline dementia -Multifactorial factorial, in the setting of UTI, hyponatremia.  With significant underlying dementia - MRI with significant motion degradation, will repeat MRI today to rule out acute CVA - B12 is borderline, will start on supplements  Hyperlipidemia  Hypertension  - Blood pressure on the lower side, so we will continue to hold home meds goading amlodipine, metoprolol and benazepril   UTI -Continue with Rocephin   Paroxysmal SVT  - IV metoprolol as needed - optimize electrolytes  -Admitted with frequent significant SVTs, blood pressure is soft for now, so I will keep on low-dose metoprolol 12.5 mg every 6 hours, hopefully can consolidate when blood pressure is improved.  Hypotension -Blood pressure is soft, will start on low-dose midodrine   Prediabetes  - A1c 1c 5.3  Remote h/o partial seizures; not on AEDs Dementia  History of occipital stroke  -Tried Namenda but was dizzy so stopped in the past - cont ASA  - delirium precautions        DVT prophylaxis: (hEPARIN Code Status: (Full) Family Communication: (Discussed with daughter at bedside) Disposition:   Status is: Inpatient    Consultants:  Note   Subjective:  Patient somnolent this morning, son at bedside reports she still with poor appetite  Objective: Vitals:   07/31/24 0000 07/31/24 0100 07/31/24 0200 07/31/24 0800  BP: 91/66   101/61  Pulse: 88   93  Resp: 19 18 19  (!) 22  Temp:    98.2 F (36.8 C)  TempSrc:    Axillary  SpO2: 92%   94%  Weight:      Height:       No intake or output data in the 24 hours ending 07/31/24 1148  Filed Weights   07/28/24 1514 07/29/24 0410 07/30/24 0500  Weight: 56.2 kg 51.1 kg 51.5 kg    Examination:  Awake Alert, she is more appropriate and coherent today, pleasant, extremely poor vision at baseline due to macular  degeneration Symmetrical Chest wall movement, Good air movement bilaterally Tachycardic, no Gallops,Rubs or new Murmurs, No Parasternal Heave +ve B.Sounds, Abd Soft, No tenderness, No rebound - guarding or rigidity. No Cyanosis, Clubbing or edema      Data Reviewed: I have personally reviewed following labs and imaging studies  CBC: Recent Labs  Lab 07/28/24 1528 07/28/24 1533 07/29/24 1048 07/31/24 0224  WBC 10.3  --  9.1 12.0*  NEUTROABS 7.1  --   --   --   HGB 11.2* 11.6* 12.3 9.6*  HCT 31.6* 34.0* 35.8* 27.4*  MCV 88.3  --  89.1 88.4  PLT 543*  --  542* 552*    Basic Metabolic Panel: Recent Labs  Lab 07/28/24 1528 07/28/24 1533 07/28/24 1700 07/29/24 0817 07/29/24 1711 07/29/24 2247 07/30/24 0548 07/31/24 0224  NA 119* 118*   < > 127* 127* 125* 125* 122*  K 4.8 4.6  --  4.3  --   --  4.0 4.0  CL 86* 87*  --  90*  --   --  90* 90*  CO2 18*  --   --  20*  --   --  20* 21*  GLUCOSE 111* 111*  --  89  --   --  111* 109*  BUN 22 25*  --  12  --   --  12 12  CREATININE 0.86 1.00  --  0.58  --   --  0.71 0.59  CALCIUM 8.4*  --   --  8.7*  --   --  7.9* 7.7*  MG  --   --   --  1.8  --   --  2.1  --   PHOS  --   --   --  3.7  --   --  3.4  --    < > = values in this interval not displayed.    GFR: Estimated Creatinine Clearance: 36 mL/min (by C-G formula based on SCr of 0.59 mg/dL).  Liver Function Tests: Recent Labs  Lab 07/28/24 1528  AST 22  ALT 13  ALKPHOS 55  BILITOT 0.8  PROT 6.7  ALBUMIN 3.0*    CBG: Recent Labs  Lab 07/28/24 2032 07/28/24 2145 07/28/24 2326 07/29/24 0317 07/29/24 0746  GLUCAP 117* 109* 111* 94 83     Recent Results (from the past 240 hours)  MRSA Next Gen by PCR, Nasal     Status: None   Collection Time: 07/28/24  7:02 PM   Specimen: Nasal Mucosa; Nasal Swab  Result Value Ref Range Status   MRSA by PCR Next Gen NOT DETECTED NOT DETECTED Final    Comment: (NOTE) The GeneXpert MRSA Assay (FDA approved for NASAL  specimens only), is one component of a comprehensive MRSA colonization surveillance program. It is not intended to diagnose MRSA infection nor to guide or monitor treatment for MRSA infections. Test performance is not FDA approved in patients less than 3 years old. Performed at [facility], [address], [place], [place] [address]  Culture, blood (Routine X 2) w Reflex to ID Panel     Status: None (Preliminary result)   Collection Time: 07/28/24  8:25 PM   Specimen: BLOOD LEFT FOREARM  Result Value Ref Range Status   Specimen Description BLOOD LEFT FOREARM  Final   Special Requests   Final    BOTTLES DRAWN AEROBIC AND ANAEROBIC Blood Culture results may not be optimal due to an inadequate volume of blood received in culture bottles   Culture   Final    NO GROWTH 3 DAYS Performed at [facility], [address], [place], [place] [address]    Report Status PENDING  Incomplete  Culture, blood (Routine X 2) w Reflex to ID Panel     Status: None (Preliminary result)   Collection Time: 07/28/24  8:35 PM   Specimen: BLOOD  Result Value Ref Range Status   Specimen Description BLOOD LEFT ANTECUBITAL  Final   Special Requests   Final    BOTTLES DRAWN AEROBIC AND ANAEROBIC Blood Culture results may not be optimal due to an inadequate volume of blood received in culture bottles   Culture   Final    NO GROWTH 3 DAYS Performed at [facility], [address] [place], [place] [address]    Report Status PENDING  Incomplete         Radiology Studies: No results found.       Scheduled Meds:  Chlorhexidine Gluconate Cloth  6 each Topical Daily   cyanocobalamin   1,000 mcg Subcutaneous Daily   feeding supplement  237 mL Oral BID BM   fluconazole  150 mg Oral Daily   heparin  5,000 Units Subcutaneous Q8H   midodrine  5 mg Oral TID WC   sodium chloride  2 g Oral TID WC   urea  15 g Oral BID   Continuous Infusions:     LOS: 3 days       Brayton Lye, MD [facility]   To contact the attending provider between 7A-7P or the covering provider during after hours 7P-7A, please log into the web site [email or web address] and access using universal [place] password for that web site. If you do not have the password, please call the hospital operator.  07/31/2024, 11:48 AM

## 2024-07-31 NOTE — Consult Note (Signed)
 Renal Service Consult Note [facility]  Danielle Vaughan 07/31/2024 Danielle JONETTA Fret, MD Requesting Physician: Dr. Sherlon  Reason for Consult: Hyponatremia HPI: The patient is a 89 y.o. year-old w/ PMH as below who presented to ED on 10/29 w/ stroke-like symptoms. AMS at baseline, hx of dementia, partial seizures, CKD 2, HTN, HL, hx CVA. She fell 9 days prior and family said was unable to walk since. Was getting abx for UTI. In ED BP 130/78, HR 98-105, RR 16-21, temp 98.6. 98% on RA. Labs showed Na+ 119, BUN 25, creat 1.00, alb 3.0, lft's negative. WBC 10K, Hb 11.6.  Neuro was consulted. CCM gave pt 3% saline and pt admitted to ICU. Na+ improved to 127 from 119 over about 24 hrs. TSH and cortisol were wnl. Then yest Na+ dropped to 125 and today 122. We are asked to see for hyponatremia.    Pt seen in room. She is pleasant, but a poor historian. Denies any SOB or CP, no n/v/d.    ROS - denies CP, no joint pain, no HA, no blurry vision, no rash, no diarrhea, no nausea/ vomiting   Past Medical History  Past Medical History:  Diagnosis Date   Hypertension    Stroke [facility])    Vertigo    Past Surgical History  Past Surgical History:  Procedure Laterality Date   PARTIAL HYSTERECTOMY     Family History  Family History  Adopted: Yes  Problem Relation Age of Onset   Other Mother        unsure of history   Other Father        unsure of history   Social History  reports that she has never smoked. She has never used smokeless tobacco. She reports that she does not drink alcohol and does not use drugs. Allergies  Allergies  Allergen Reactions   Cefoxitin     Tolerates Ceftriaxone 07/29/2024   Ceftin [Cefuroxime Axetil] Other (See Comments)    Seeing lights Tolerates Ceftriaxone 07/29/2024    Codeine Other (See Comments)    Unknown   Home medications Prior to Admission medications   Medication Sig Start Date End Date Taking? Authorizing Provider  acetaminophen   (TYLENOL ) 500 MG tablet Take 500-1,000 mg by mouth every 6 (six) hours as needed for moderate pain (pain score 4-6).   Yes [provider]  amLODipine (NORVASC) 5 MG tablet Take 5 mg by mouth at bedtime.    Yes [provider]  benazepril (LOTENSIN) 40 MG tablet Take 40 mg by mouth at bedtime.    Yes [provider]  Camphor-Menthol-Methyl Sal (SALONPAS EX) Apply 1 Application topically daily as needed (Pain).   Yes [provider]  fluconazole (DIFLUCAN) 150 MG tablet Take 150 mg by mouth daily. 07/26/24  Yes [provider]  ibuprofen (ADVIL) 200 MG tablet Take 200-800 mg by mouth every 6 (six) hours as needed for moderate pain (pain score 4-6).   Yes [provider]  ketoconazole (NIZORAL) 2 % cream Apply 1 Application topically 2 (two) times daily. 07/26/24  Yes [provider]  Menthol, Topical Analgesic, (BIOFREEZE EX) Apply 1 Application topically as needed (Pain).   Yes [provider]  Menthol, Topical Analgesic, (ICY HOT ADVANCED RELIEF EX) Apply 1 Application topically as needed (Pain).   Yes [provider]  metoprolol succinate (TOPROL-XL) 100 MG 24 hr tablet Take 100 mg by mouth daily. Take with or immediately following a meal.   Yes [provider]  Polyethyl Glycol-Propyl Glycol (SYSTANE FREE OP) Place 1 drop into both eyes at bedtime.   Yes [provider]     Vitals:   07/31/24 0100 07/31/24 0200 07/31/24 0800 07/31/24 1225  BP:   101/61 98/64  Pulse:   93 (!) 112  Resp: 18 19 (!) 22 (!) 22  Temp:   98.2 F (36.8 C) 98.1 F (36.7 C)  TempSrc:   Axillary Axillary  SpO2:   94% 100%  Weight:      Height:       Exam Gen alert, no distress, confused No rash, cyanosis or gangrene Sclera anicteric, throat clear  No jvd or bruits Chest clear bilat to bases, no rales/ wheezing RRR no MRG Abd soft ntnd no mass or ascites +bs GU deferred MS no joint effusions or deformity Ext  no LE or UE edema, no other edema Neuro is alert, Ox 3 , nf    Home meds: Norvasc, benazepril, advil, toprol xl, diflucan, ketoconazole, menthol topical  IP meds of interest: Midodrine 5 mg tid started here today for hypotension  Serum osm 252 ( on admit) Cortisol 19, TSH 0.472 UN 69, UOsm 318  VS today --> BP 90s- 110/ 60-70 last 24 hrs On admit VS --> 120-140/ 65-80 initially, dropping on 10/31 and 11/01 Labs today --> Na 122, CO2 21, BUN 12, creat 0.59, Ca 7.7, alb 3.0, lft's wnl  WBC 12, Hb 9.6  Avoid acei/ ARB in future   Assessment/ Plan: Hyponatremia: looks euvolemic on exam. Urine sodium is wnl, suggesting SIADH picture. TSH and cortisol wnl, no signs of cirrhosis/ CHF/ renal failure. Responded to 3% saline earlier here. Don't see any siadh medications. Not sure the cause. Would d/c acei and try to avoid RAAS inhibitors for now. Norvasc and BB's are okay, hydralazine and others may be used as well. Recommend cont salt tabs at 2 gm tid for now, if possible we should have a fluid restriction to about 1000 cc /day. Have d/w pmd. Will dc urea for now (salt tabs have essentially the same effect). Will get q 4 hr Na+ levels. Will follow.  Dementia AMS HTN: as above UTI: on rocephin H/o partial seizures: not on medication H/o CVA     Danielle Fret  MD [facility] 07/31/2024, 2:33 PM  Recent Labs  Lab 07/30/24 0548 07/31/24 0224  CREATININE 0.71 0.59  K 4.0 4.0   Inpatient medications:  Chlorhexidine Gluconate Cloth  6 each Topical Daily   cyanocobalamin   1,000 mcg Subcutaneous Daily   feeding supplement  237 mL Oral BID BM   fluconazole  150 mg Oral Daily   heparin  5,000 Units Subcutaneous Q8H   metoprolol tartrate  12.5 mg Oral Q6H   midodrine  5 mg Oral TID WC   sodium chloride  2 g Oral TID WC   urea  15 g Oral BID    acetaminophen , docusate sodium, mouth rinse, polyethylene glycol

## 2024-08-01 ENCOUNTER — Inpatient Hospital Stay (HOSPITAL_COMMUNITY)

## 2024-08-01 DIAGNOSIS — I69391 Dysphagia following cerebral infarction: Secondary | ICD-10-CM

## 2024-08-01 DIAGNOSIS — I4892 Unspecified atrial flutter: Secondary | ICD-10-CM

## 2024-08-01 DIAGNOSIS — I6389 Other cerebral infarction: Secondary | ICD-10-CM

## 2024-08-01 DIAGNOSIS — I739 Peripheral vascular disease, unspecified: Secondary | ICD-10-CM

## 2024-08-01 DIAGNOSIS — E871 Hypo-osmolality and hyponatremia: Secondary | ICD-10-CM | POA: Diagnosis not present

## 2024-08-01 LAB — BASIC METABOLIC PANEL WITH GFR
Anion gap: 11 (ref 5–15)
BUN: 17 mg/dL (ref 8–23)
CO2: 21 mmol/L — ABNORMAL LOW (ref 22–32)
Calcium: 7.9 mg/dL — ABNORMAL LOW (ref 8.9–10.3)
Chloride: 93 mmol/L — ABNORMAL LOW (ref 98–111)
Creatinine, Ser: 0.51 mg/dL (ref 0.44–1.00)
GFR, Estimated: >60 mL/min (ref >60)
Glucose, Bld: 92 mg/dL (ref 70–99)
Potassium: 3.9 mmol/L (ref 3.5–5.1)
Sodium: 125 mmol/L — ABNORMAL LOW (ref 135–145)

## 2024-08-01 LAB — ECHOCARDIOGRAM COMPLETE
Height: 61 in
S' Lateral: 2.7 cm
Weight: 1922.41 [oz_av]

## 2024-08-01 LAB — SODIUM
Sodium: 127 mmol/L — ABNORMAL LOW (ref 135–145)
Sodium: 124 mmol/L — ABNORMAL LOW (ref 135–145)
Sodium: 128 mmol/L — ABNORMAL LOW (ref 135–145)
Sodium: 130 mmol/L — ABNORMAL LOW (ref 135–145)
Sodium: 129 mmol/L — ABNORMAL LOW (ref 135–145)

## 2024-08-01 LAB — LIPID PANEL
Cholesterol: 61 mg/dL (ref 0–200)
HDL: 31 mg/dL — ABNORMAL LOW (ref >40)
LDL Cholesterol: 21 mg/dL (ref 0–99)
Total CHOL/HDL Ratio: 2 ratio
Triglycerides: 45 mg/dL (ref <150)
VLDL: 9 mg/dL (ref 0–40)

## 2024-08-01 LAB — PHOSPHORUS: Phosphorus: 3.1 mg/dL (ref 2.5–4.6)

## 2024-08-01 LAB — CBC
HCT: 26.3 % — ABNORMAL LOW (ref 36.0–46.0)
Hemoglobin: 9.1 g/dL — ABNORMAL LOW (ref 12.0–15.0)
MCH: 30.8 pg (ref 26.0–34.0)
MCHC: 34.6 g/dL (ref 30.0–36.0)
MCV: 89.2 fL (ref 80.0–100.0)
Platelets: 528 K/uL — ABNORMAL HIGH (ref 150–400)
RBC: 2.95 MIL/uL — ABNORMAL LOW (ref 3.87–5.11)
RDW: 16.6 % — ABNORMAL HIGH (ref 11.5–15.5)
WBC: 9.7 K/uL (ref 4.0–10.5)
nRBC: 0 % (ref 0.0–0.2)

## 2024-08-01 LAB — MAGNESIUM: Magnesium: 2 mg/dL (ref 1.7–2.4)

## 2024-08-01 MED ORDER — METOPROLOL TARTRATE 5 MG/5ML IV SOLN: 5.0000 mg | INTRAVENOUS | Status: DC | PRN

## 2024-08-01 MED ORDER — IOHEXOL 350 MG/ML SOLN
75.0000 mL | Freq: Once | INTRAVENOUS | Status: AC | PRN
Start: 2024-08-01 — End: 2024-08-01
  Administered 2024-08-01: 75 mL via INTRAVENOUS

## 2024-08-01 MED ORDER — MIDODRINE HCL 5 MG PO TABS
10.0000 mg | ORAL_TABLET | Freq: Three times a day (TID) | ORAL | Status: DC
  Administered 2024-08-01 – 2024-08-05 (×11): 10 mg via ORAL
  Filled 2024-08-01 (×13): qty 2

## 2024-08-01 MED ORDER — ASPIRIN 81 MG PO TBEC
81.0000 mg | DELAYED_RELEASE_TABLET | Freq: Every day | ORAL | Status: DC
  Administered 2024-08-01: 81 mg via ORAL
  Filled 2024-08-01: qty 1

## 2024-08-01 MED ORDER — STROKE: EARLY STAGES OF RECOVERY BOOK
Freq: Once | Status: AC
  Filled 2024-08-01: qty 1

## 2024-08-01 NOTE — Progress Notes (Addendum)
 STROKE TEAM PROGRESS NOTE   INTERIM HISTORY/SUBJECTIVE Family at the bedside.  Patient sitting in the chair in no apparent distress.  She is confused, can follow some simple commands. Overnight events noted per having SVT, and on review of EKG strip it was a flutter She had been admitted to hospital for few days for confusion and hyponatremia.  MRI brain was repeated that revealed multiple small embolic infarcts involving parieto-occipital lobes as well as frontal lobes on the left.    CBC    Component Value Date/Time   WBC 9.7 08/01/2024 0338   RBC 2.95 (L) 08/01/2024 0338   HGB 9.1 (L) 08/01/2024 0338   HCT 26.3 (L) 08/01/2024 0338   PLT 528 (H) 08/01/2024 0338   MCV 89.2 08/01/2024 0338   MCH 30.8 08/01/2024 0338   MCHC 34.6 08/01/2024 0338   RDW 16.6 (H) 08/01/2024 0338   LYMPHSABS 1.7 07/28/2024 1528   MONOABS 1.3 (H) 07/28/2024 1528   EOSABS 0.1 07/28/2024 1528   BASOSABS 0.1 07/28/2024 1528    BMET    Component Value Date/Time   NA 125 (L) 08/01/2024 0338   NA 127 (L) 08/01/2024 0338   K 3.9 08/01/2024 0338   CL 93 (L) 08/01/2024 0338   CO2 21 (L) 08/01/2024 0338   GLUCOSE 92 08/01/2024 0338   BUN 17 08/01/2024 0338   CREATININE 0.51 08/01/2024 0338   CALCIUM 7.9 (L) 08/01/2024 0338   GFRNONAA >60 08/01/2024 0338    IMAGING past 24 hours MR BRAIN WO CONTRAST Result Date: 07/31/2024 EXAM: MRI BRAIN WITHOUT CONTRAST 07/31/2024 05:10:47 PM TECHNIQUE: Multiplanar multisequence MRI of the head/brain was performed without the administration of intravenous contrast. COMPARISON: 07/28/2024 CLINICAL HISTORY: Patient with left hand weakness, left tongue deviation, concern of acute CVA, aware of recent MRI, but was poor quality, currently patient is more cooperative. FINDINGS: BRAIN AND VENTRICLES: Resolving diffusion abnormalities within the posterior hemispheres. No new ischemic lesions. Old right occipital infarct. Early confluent hyperintense T2-weighted signal within the  cerebral white matter, most commonly due to chronic small vessel disease. No intracranial hemorrhage. No mass. No midline shift. No hydrocephalus. The sella is unremarkable. Normal flow voids. ORBITS: Ocular lens replacements. No acute abnormality. SINUSES AND MASTOIDS: No acute abnormality. BONES AND SOFT TISSUES: Normal marrow signal. No acute soft tissue abnormality. IMPRESSION: 1. Resolving diffusion abnormalities within the posterior hemispheres with no new ischemic lesions. 2. Early confluent T2 hyperintense signal within the cerebral white matter, most consistent with chronic small vessel disease. Electronically signed by: Franky Stanford MD 07/31/2024 05:38 PM EDT RP Workstation: [id number]    Vitals:   08/01/24 0454 08/01/24 0500 08/01/24 0527 08/01/24 0743  BP: 100/71   98/66  Pulse: (!) 140   82  Resp: 19 18 19    Temp:    97.6 F (36.4 C)  TempSrc:    Oral  SpO2:    100%  Weight:      Height:         PHYSICAL EXAM General:  Alert, well-nourished, well-developed patient in no acute distress Psych:  Mood and affect appropriate for situation CV: Regular rate and rhythm on monitor Respiratory:  Regular, unlabored respirations on room air GI: Abdomen soft and nontender   NEURO:  Mental Status: Confused mild dysarthria  Cranial Nerves:  II: PERRL. Visual fields no blink to threat bilaterally, does not track, cortical blindness with some visual hallucinations III, IV, VI: EOMI.  V: Sensation is intact to light touch and symmetrical to face.  VII: Face is symmetrical resting and smiling VIII: hearing intact to voice. IX, X: Palate elevates symmetrically. Phonation is normal.  KP:Dynloizm shrug 5/5. XII: tongue is midline without fasciculations. Motor: 5/5 strength in right upper, left arm with a drift, bilateral lowers withdraws to pain does not move against gravity Tone: is normal and bulk is normal Sensation- response to noxious stimuli Coordination: Unable to assess Gait-  deferred   ASSESSMENT/PLAN  Danielle Vaughan is a 89 y.o. female with history of  hypertension, previous stroke who presents with altered mental status has been going on for a week or two.  She had a fall, and following this she spent a couple of days in bed.  She was noted to be more confused.  She presented to the emergency department with confusion, and was noted that she was hyponatremic at 119 and she was admitted to the ICU for hyponatremia.  An MRI was obtained on 10/29, however is markedly limited and there was some suggestion of ischemia, but this was not definite by the initial scan and therefore this was repeated on 11/1 which revealed multifocal posterior circulation predominant infarcts.  NIH on Admission 22  Acute Ischemic Infarct:  Patchy subacute bilateral posterior circulation infarcts, possibly one in the posterior watershed area etiology likely cardioembolic with cortical blindness and denial of blindness CTA head & neck ordered MRI   Resolving diffusion abnormalities within the posterior hemispheres with no new ischemic lesions. 2. Early confluent T2 hyperintense signal within the cerebral white matter, most consistent with chronic small vessel disease 2D Echo EF 40 to 45%.  Mild LVH with grade 1 diastolic dysfunction.  Moderate TV regurgitation LDL 21 HgbA1c 5.3 VTE prophylaxis -heparin subcu No antithrombotic prior to admission, now on aspirin 81 mg daily may need to consider DOAC Therapy recommendations:  Pending Disposition: Pending  Hx of Stroke/TIA Right occipital stroke  Cardiac arrhythmias history of nonsustained V. Tach; Paroxysmal SVT A flutter on monitor Follows with cardiology Need to consider DOAC  Hypertension Home meds: Amlodipine 5 mg, Lotensin 40 mg, Stable Blood Pressure Goal: SBP less than 160   Hyperlipidemia Home meds: None LDL 21, goal < 70 High intensity statin not indicated due to well below goal Continue statin at  discharge  Dysphagia Patient has post-stroke dysphagia, SLP consulted    Diet   Diet NPO time specified   Advance diet as tolerated  Other Stroke Risk Factors Congestive heart failure   Other Active Problems Dementia UTI Hyponatremia  Hospital day # 4   Danielle Geralds DNP, ACNPC-AG  [facility]  I have personally obtained history,examined this patient, reviewed notes, independently viewed imaging studies, participated in medical decision making and plan of care.ROS completed by me personally and pertinent positives fully documented  I have made any additions or clarifications directly to the above note. Agree with note above.  Patient presented with confusion and MRI showing subacute bilateral possibly posterior circulation and few antiplatelet and embolic infarcts.  Overnight monitoring it on a flutter these are likely cardioembolic.  Patient's clinical presentation suggest cortical blindness with some bradycardia blindness.  Will need to discuss with family risk-benefit of anticoagulation for stroke prevention given A-flutter and multiple stroke.  Continue ongoing stroke workup.  Cardiovascular bedside for discussion.  Discussed with Dr Lanelle   I personally spent a total of 50 minutes in the care of the patient today including getting/reviewing separately obtained history, performing a medically appropriate exam/evaluation, counseling and educating, placing orders, referring and communicating  with other health care professionals, documenting clinical information in the EHR, independently interpreting results, and coordinating care.         Eather Popp, MD Medical Director [facility] Pager: [phone] 08/01/2024 3:53 PM   To contact Stroke Continuity provider, please refer to [email or web address]. After hours, contact General Neurology

## 2024-08-01 NOTE — Progress Notes (Signed)
 Physical Therapy Treatment Patient Details Name: Danielle Vaughan MRN: [id number] DOB: 1933/05/17 Today's Date: 08/01/2024   History of Present Illness 89 year old female adm 10/29 with stroke like symptoms. 10/29 Brain MRI suggested ischemia. 11/1 Brain MRI showed multifocal posterior circulation predominant infarcts. PMHx: stroke, hypertension, hyperlipidemia, pre-diabetes, CKD II, partial seizures, dementia with baseline orientation to self and place   PT Comments  Pt received in supine and agreeable to PT session. Pt s/p new infarcts with mobility remaining at TotalAx2. Pt unable to assist with movement with squat-pivot performed to transfer to the recliner. Appeared to have L>R sided weakness, however, difficult to assess due to cognition. Pt had delayed responses throughout with difficulty following commands. Continue to recommend <3hrs post acute rehab to decrease caregiver burden and improve QOL. If family chooses to take pt home, DME recommendations for hospital bed and hoyer lift. Acute PT to follow.     If plan is discharge home, recommend the following: Two people to help with walking and/or transfers;Two people to help with bathing/dressing/bathroom;Direct supervision/assist for medications management;Direct supervision/assist for financial management;Assist for transportation;Help with stairs or ramp for entrance;Supervision due to cognitive status   Can travel by private vehicle     No  Equipment Recommendations  Hoyer lift;Hospital bed       Precautions / Restrictions Precautions Precautions: Fall Recall of Precautions/Restrictions: Impaired Restrictions Weight Bearing Restrictions Per Provider Order: No     Mobility  Bed Mobility Overal bed mobility: Needs Assistance Bed Mobility: Supine to Sit    Supine to sit: Total assist, +2 for physical assistance    General bed mobility comments: TotalAx2 for all aspects with pt not following commands    Transfers Overall  transfer level: Needs assistance Equipment used: 2 person hand held assist Transfers: Bed to chair/wheelchair/BSC     Squat pivot transfers: Total assist, +2 physical assistance    General transfer comment: Total assist for all mobility    Modified Rankin (Stroke Patients Only) Modified Rankin (Stroke Patients Only) Pre-Morbid Rankin Score: Severe disability Modified Rankin: Severe disability     Balance Overall balance assessment: Needs assistance Sitting-balance support: Feet supported Sitting balance-Leahy Scale: Poor Sitting balance - Comments: posterior lean Postural control: Posterior lean Standing balance support: Bilateral upper extremity supported Standing balance-Leahy Scale: Zero     Communication Communication Communication: Impaired Factors Affecting Communication: Difficulty expressing self;Reduced clarity of speech  Cognition Arousal: Lethargic Behavior During Therapy: Restless, Flat affect   PT - Cognitive impairments: History of cognitive impairments, Orientation   Orientation impairments: Place, Time, Situation    Following commands: Impaired Following commands impaired: Follows one step commands with increased time    Cueing Cueing Techniques: Verbal cues     General Comments General comments (skin integrity, edema, etc.): VSS      Pertinent Vitals/Pain Pain Assessment Pain Assessment: Faces Faces Pain Scale: Hurts even more Pain Location: generalized with movement Pain Descriptors / Indicators: Discomfort, Grimacing, Guarding Pain Intervention(s): Limited activity within patient's tolerance, Monitored during session, Repositioned    Home Living Family/patient expects to be discharged to:: Private residence Living Arrangements: Children Available Help at Discharge: Family;Available 24 hours/day Type of Home: House Home Access: Stairs to enter   [employer] of Steps: 3-4   Home Layout: One level Home Equipment: [employer]  (2 wheels);Wheelchair - manual          PT Goals (current goals can now be found in the care plan section) Acute Rehab PT Goals PT Goal Formulation: Patient unable  to participate in goal setting Time For Goal Achievement: 08/13/24 Potential to Achieve Goals: Fair Progress towards PT goals: Progressing toward goals    Frequency    Min 2X/week           Co-evaluation   Reason for Co-Treatment: Complexity of the patient's impairments (multi-system involvement);Necessary to address cognition/behavior during functional activity;To address functional/ADL transfers PT goals addressed during session: Balance;Mobility/safety with mobility OT goals addressed during session: ADL's and self-care      AM-PAC PT 6 Clicks Mobility   Outcome Measure  Help needed turning from your back to your side while in a flat bed without using bedrails?: Total Help needed moving from lying on your back to sitting on the side of a flat bed without using bedrails?: Total Help needed moving to and from a bed to a chair (including a wheelchair)?: Total Help needed standing up from a chair using your arms (e.g., wheelchair or bedside chair)?: Total Help needed to walk in hospital room?: Total Help needed climbing 3-5 steps with a railing? : Total 6 Click Score: 6    End of Session Equipment Utilized During Treatment: Gait belt Activity Tolerance: Patient limited by fatigue;Other (comment) (not following commands) Patient left: in chair;with call bell/phone within reach;with chair alarm set;with family/visitor present;Other (comment) (lift pad) Nurse Communication: Mobility status;Need for lift equipment PT Visit Diagnosis: Other abnormalities of gait and mobility (R26.89)     Time: 0826-0901 PT Time Calculation (min) (ACUTE ONLY): 35 min  Charges:    $Therapeutic Activity: 8-22 mins PT General Charges $$ ACUTE PT VISIT: 1 Visit                    Kate ORN, PT, DPT Secure Chat Preferred   Rehab Office [phone]   Kate BRAVO [name] 08/01/2024, 9:58 AM

## 2024-08-01 NOTE — TOC Progression Note (Signed)
 Transition of Care [facility]) - Progression Note    Patient Details  Name: Danielle Vaughan MRN: [id number] Date of Birth: 03/02/1933  Transition of Care [facility] CM/SW Contact  Isaiah Public, LCSWA Phone Number: 08/01/2024, 3:31 PM  Clinical Narrative:     TOC continuing to follow for disposition needs once patient closer to being medically stable.     Barriers to Discharge: Continued Medical Work up               Expected Discharge Plan and Services In-house Referral: Clinical Social Work     Living arrangements for the past 2 months: Single Family Home                                       Social Drivers of Health (SDOH) Interventions SDOH Screenings   Food Insecurity: No Food Insecurity (07/29/2024)  Housing: Low Risk  (07/29/2024)  Recent Concern: Housing - High Risk (07/29/2024)  Transportation Needs: No Transportation Needs (07/29/2024)  Utilities: Not At Risk (07/29/2024)  Alcohol Screen: Low Risk  (07/29/2024)  Depression (PHQ2-9): Low Risk  (07/29/2024)  Financial Resource Strain: Low Risk  (07/29/2024)  Physical Activity: Inactive (07/29/2024)  Social Connections: Moderately Integrated (07/29/2024)  Stress: No Stress Concern Present (07/29/2024)  Tobacco Use: Low Risk  (07/29/2024)  Health Literacy: Inadequate Health Literacy (07/29/2024)    Readmission Risk Interventions     No data to display

## 2024-08-01 NOTE — Consult Note (Signed)
 NEUROLOGY CONSULT NOTE   Date of service: August 01, 2024 Patient Name: Danielle Vaughan MRN:  [id number] DOB:  10/18/1932 Chief Complaint: lorre Requesting Provider: Sherlon Brayton RAMAN, MD  History of Present Illness  Danielle Vaughan is a 89 y.o. female with hx of hypertension, previous stroke who presents with altered mental status has been going on for a week or two.  She had a fall, and following this she spent a couple of days in bed.  She was noted to be more confused.  She presented to the emergency department with confusion, and was noted that she was hyponatremic at 119 and she was admitted to the ICU for hyponatremia.  An MRI was obtained on 10/29, however is markedly limited and there was some suggestion of ischemia, but this was not definite by the initial scan and therefore this was repeated on 11/1 which revealed multifocal posterior circulation predominant infarcts.  Her son is present and reports that she has been more alert, but received sedation for her MRI and she is somnolent and noncooperative at the current time.  LKW: Unclear, a week or two ago Modified rankin score: 3-Moderate disability-requires help but walks WITHOUT assistance IV Thrombolysis: No, outside of window EVT: No, outside of window  NIHSS components Score: Comment  1a Level of Conscious 0[]  1[]  2[x]  3[]      1b LOC Questions 0[]  1[]  2[x]       1c LOC Commands 0[x]  1[]  2[]       2 Best Gaze 0[]  1[x]  2[]       3 Visual 0[]  1[]  2[]  3[x]      4 Facial Palsy 0[x]  1[]  2[]  3[]      5a Motor Arm - left 0[]  1[]  2[]  3[x]  4[]  UN[]    5b Motor Arm - Right 0[]  1[]  2[]  3[x]  4[]  UN[]    6a Motor Leg - Left 0[]  1[]  2[]  3[x]  4[]  UN[]    6b Motor Leg - Right 0[]  1[]  2[]  3[x]  4[]  UN[]    7 Limb Ataxia 0[x]  1[]  2[]  UN[]      8 Sensory 0[x]  1[]  2[]  UN[]      9 Best Language 0[]  1[x]  2[]  3[]      10 Dysarthria 0[]  1[x]  2[]  UN[]      11 Extinct. and Inattention 0[x]  1[]  2[]       TOTAL: 22   Limited by sedation   Past History    Past Medical History:  Diagnosis Date   Hypertension    Stroke (HCC)    Vertigo     Past Surgical History:  Procedure Laterality Date   PARTIAL HYSTERECTOMY      Family History: Family History  Adopted: Yes  Problem Relation Age of Onset   Other Mother        unsure of history   Other Father        unsure of history    Social History  reports that she has never smoked. She has never used smokeless tobacco. She reports that she does not drink alcohol and does not use drugs.  Allergies  Allergen Reactions   Cefoxitin     Tolerates Ceftriaxone 07/29/2024   Ceftin [Cefuroxime Axetil] Other (See Comments)    Seeing lights Tolerates Ceftriaxone 07/29/2024    Codeine Other (See Comments)    Unknown    Medications   Current Facility-Administered Medications:    acetaminophen  (TYLENOL ) tablet 650 mg, 650 mg, Oral, Q6H PRN, Howerter, Justin B, DO, 650 mg at 07/31/24 1201   Chlorhexidine Gluconate Cloth 2 % PADS 6  each, 6 each, Topical, Daily, Bowser, Ronnald BRAVO, NP, 6 each at 07/31/24 1140   cyanocobalamin  (VITAMIN B12) injection 1,000 mcg, 1,000 mcg, Subcutaneous, Daily, Elgergawy, Dawood S, MD, 1,000 mcg at 07/31/24 1146   docusate sodium (COLACE) capsule 100 mg, 100 mg, Oral, BID PRN, Bowser, Grace E, NP   feeding supplement (ENSURE PLUS HIGH PROTEIN) liquid 237 mL, 237 mL, Oral, BID BM, Alva, Rakesh V, MD, 237 mL at 07/31/24 1520   fluconazole (DIFLUCAN) tablet 150 mg, 150 mg, Oral, Daily, Alva, Rakesh V, MD, 150 mg at 07/31/24 1142   heparin injection 5,000 Units, 5,000 Units, Subcutaneous, Q8H, Bowser, Ronnald BRAVO, NP, 5,000 Units at 07/31/24 2200   metoprolol tartrate (LOPRESSOR) tablet 12.5 mg, 12.5 mg, Oral, Q6H, Elgergawy, Dawood S, MD, 12.5 mg at 07/31/24 2200   midodrine (PROAMATINE) tablet 5 mg, 5 mg, Oral, TID WC, Elgergawy, Dawood S, MD, 5 mg at 07/31/24 1822   Oral care mouth rinse, 15 mL, Mouth Rinse, PRN, Gretta Doffing P, DO   polyethylene glycol (MIRALAX /  GLYCOLAX) packet 17 g, 17 g, Oral, Daily PRN, Bowser, Ronnald BRAVO, NP   sodium chloride tablet 2 g, 2 g, Oral, TID WC, Elgergawy, Brayton RAMAN, MD, 2 g at 07/31/24 1822  Vitals   Vitals:   07/31/24 1952 07/31/24 2000 07/31/24 2200 08/01/24 0000  BP: 119/81  114/76 108/73  Pulse: (!) 108  88 78  Resp: 18 19  (!) 28  Temp: 98.8 F (37.1 C)   99.1 F (37.3 C)  TempSrc: Axillary   Axillary  SpO2:      Weight:      Height:        Body mass index is 21.45 kg/m.   Physical Exam   Constitutional: Appears well-developed and well-nourished.   Neurologic Examination   Her exam is somewhat limited by sedation and noncooperation  Neuro: Mental Status: Patient is somnolent, and when I arouse her she states fairly clearly that she does not want to be bothered, she is very slightly dysarthric.  She is able to tell me her name, but will not tell me where she is, what month it is, what year it is Cranial Nerves: II: She does not blink to threat from either direction, she does not clearly fixate or track. Pupils are equal, round, and reactive to light.   III,IV, VI: Eyes are very slightly disconjugate, and she does not look to either side and resist doll's maneuver VII: Facial movement is symmetric.  Motor: She does not cooperate with any testing, she moves all extremities to noxious stimulation but does not attempt to lift against gravity  sensory: As above  Cerebellar: Does not perform       Labs/Imaging/Neurodiagnostic studies   CBC:  Recent Labs  Lab Aug 07, 2024 1528 08-07-2024 1533 07/29/24 1048 07/31/24 0224  WBC 10.3  --  9.1 12.0*  NEUTROABS 7.1  --   --   --   HGB 11.2*   < > 12.3 9.6*  HCT 31.6*   < > 35.8* 27.4*  MCV 88.3  --  89.1 88.4  PLT 543*  --  542* 552*   < > = values in this interval not displayed.   Basic Metabolic Panel:  Lab Results  Component Value Date   NA 122 (L) 07/31/2024   K 3.9 07/31/2024   CO2 20 (L) 07/31/2024   GLUCOSE 156 (H) 07/31/2024    BUN 22 07/31/2024   CREATININE 0.58 07/31/2024   CALCIUM 7.6 (L)  07/31/2024   GFRNONAA >60 07/31/2024   GFRAA >60 06/07/2017   Lipid Panel: No results found for: LDLCALC HgbA1c:  Lab Results  Component Value Date   HGBA1C 5.3 07/29/2024   Urine Drug Screen:     Component Value Date/Time   LABOPIA NONE DETECTED 08/03/2023 1101   COCAINSCRNUR NONE DETECTED 08/03/2023 1101   LABBENZ NONE DETECTED 08/03/2023 1101   AMPHETMU NONE DETECTED 08/03/2023 1101   THCU NONE DETECTED 08/03/2023 1101   LABBARB NONE DETECTED 08/03/2023 1101    Alcohol Level     Component Value Date/Time   [facility] <15 07/28/2024 1528   INR  Lab Results  Component Value Date   INR 1.1 07/28/2024   APTT  Lab Results  Component Value Date   APTT 36 07/28/2024   AED levels:  Lab Results  Component Value Date   LEVETIRACETA <1.0 (L) 10/30/2020     MRI Brain(Personally reviewed): Patchy subacute bilateral posterior circulation infarcts, possibly one in the posterior watershed area as well  ASSESSMENT   Danielle Vaughan is a 89 y.o. female with bilateral patchy posterior circulation infarcts, my suspicion is that an embolus broke up in shower due to bilateral PCA territories.  She will need to be further evaluated with embolic stroke workup.  I discussed with her son that I suspect that her confusion will not completely improve, and given that she has pretty significant underlying dementia, she will likely not be as capable as she was previously.  I do think would be worthwhile to look at the posterior circulation with a CTA.  RECOMMENDATIONS  - HgbA1c, fasting lipid panel - Frequent neuro checks - Echocardiogram - CTA head and neck - Prophylactic therapy-Antiplatelet med: Aspirin - dose 81mg   - Risk factor modification - Telemetry monitoring - PT consult, OT consult, Speech consult - Stroke team to  follow  ______________________________________________________________________    Signed, Aisha Seals, MD [facility]

## 2024-08-01 NOTE — Evaluation (Signed)
 Occupational Therapy Re-Evaluation Patient Details Name: Danielle Vaughan MRN: [id number] DOB: September 14, 1933 Today's Date: 08/01/2024   History of Present Illness   89 year old female adm 10/29 with stroke like symptoms. 10/29 Brain MRI suggested ischemia. 11/1 Brain MRI showed multifocal posterior circulation predominant infarcts. PMHx: stroke, hypertension, hyperlipidemia, pre-diabetes, CKD II, partial seizures, dementia with baseline orientation to self and place     Clinical Impressions Pt seen s/p new infarcts, pt remains total A +2 for bed mobility and squat pivot transfer to chair. Pt with impaired cognition, and delayed responses/initiation of instructions throughout session. Noted LUE  > RUE weakness and impaired vision, as pt unable to locate items in central visual field, difficulty following commands overall for full visual assessment. Pt presenting with impairments listed below, will follow acutely. Patient will benefit from continued inpatient follow up therapy, <3 hours/day to maximize safety/ind with ADL/functional mobility.      If plan is discharge home, recommend the following:   Two people to help with walking and/or transfers;A lot of help with bathing/dressing/bathroom     Functional Status Assessment   Patient has had a recent decline in their functional status and demonstrates the ability to make significant improvements in function in a reasonable and predictable amount of time.     Equipment Recommendations   Other (comment) (defer)     Recommendations for Other Services   PT consult     Precautions/Restrictions   Precautions Precautions: Fall Recall of Precautions/Restrictions: Impaired Restrictions Weight Bearing Restrictions Per Provider Order: No     Mobility Bed Mobility Overal bed mobility: Needs Assistance Bed Mobility: Supine to Sit     Supine to sit: Total assist, +2 for physical assistance          Transfers Overall  transfer level: Needs assistance   Transfers: Bed to chair/wheelchair/BSC     Squat pivot transfers: Total assist, +2 physical assistance              Balance Overall balance assessment: Needs assistance Sitting-balance support: Feet supported Sitting balance-Leahy Scale: Poor Sitting balance - Comments: posterior lean Postural control: Posterior lean Standing balance support: Bilateral upper extremity supported Standing balance-Leahy Scale: Zero                             ADL either performed or assessed with clinical judgement   ADL Overall ADL's : Needs assistance/impaired Eating/Feeding: Moderate assistance   Grooming: Moderate assistance   Upper Body Bathing: Maximal assistance;Bed level   Lower Body Bathing: Total assistance;Bed level   Upper Body Dressing : Maximal assistance;Bed level   Lower Body Dressing: Total assistance;Bed level   Toilet Transfer: Total assistance   Toileting- Clothing Manipulation and Hygiene: Total assistance       Functional mobility during ADLs: Total assistance;+2 for physical assistance       Vision Ability to See in Adequate Light: 1 Impaired Additional Comments: pt unable to see therapist's hand in front of pt, looking multidirectional to find objects in room that are in central visual field     Perception Perception: Not tested       Praxis Praxis: Not tested       Pertinent Vitals/Pain Pain Assessment Pain Assessment: Faces Pain Score: 6  Faces Pain Scale: Hurts even more Pain Location: generalized iwth movement Pain Descriptors / Indicators: Discomfort, Grimacing, Guarding Pain Intervention(s): Limited activity within patient's tolerance, Monitored during session, Repositioned     Extremity/Trunk Assessment Upper  Extremity Assessment Upper Extremity Assessment: LUE deficits/detail;RUE deficits/detail RUE Deficits / Details: global weakness, 2+/5, unable to lift shoulder against gravity without  assist LUE Deficits / Details: mild edema, painful, 2-/5 globally   Lower Extremity Assessment Lower Extremity Assessment: Defer to PT evaluation   Cervical / Trunk Assessment Cervical / Trunk Assessment: Kyphotic   Communication Communication Communication: Impaired Factors Affecting Communication: Difficulty expressing self;Reduced clarity of speech   Cognition Arousal: Lethargic Behavior During Therapy: Restless, Flat affect Cognition: Cognition impaired   Orientation impairments: Time, Situation, Place Awareness: Online awareness impaired, Intellectual awareness impaired Memory impairment (select all impairments): Short-term memory Attention impairment (select first level of impairment): Sustained attention Executive functioning impairment (select all impairments): Initiation, Problem solving OT - Cognition Comments: pt states she is at home, ~15 second delay to follow command                 Following commands: Impaired Following commands impaired: Follows one step commands with increased time     Cueing  General Comments   Cueing Techniques: Verbal cues  VSS   Exercises     Shoulder Instructions      Home Living Family/patient expects to be discharged to:: Private residence Living Arrangements: Children Available Help at Discharge: Family;Available 24 hours/day Type of Home: House Home Access: Stairs to enter [employer] of Steps: 3-4   Home Layout: One level     Bathroom Shower/Tub: Tub/shower unit;Walk-in shower   Bathroom Toilet: Standard Bathroom Accessibility: Yes How Accessible: Accessible via walker Home Equipment: Rolling Walker (2 wheels);Wheelchair - manual          Prior Functioning/Environment Prior Level of Function : Needs assist             Mobility Comments: Per family in room, pt hasnt been ambulating much however uses RW short distances with family assistance. WC in community ADLs Comments: Family  assistance bed level    OT Problem List: Decreased strength;Decreased activity tolerance;Impaired balance (sitting and/or standing);Decreased safety awareness;Decreased cognition;Impaired vision/perception;Pain   OT Treatment/Interventions: Self-care/ADL training;Balance training;Therapeutic activities;DME and/or AE instruction      OT Goals(Current goals can be found in the care plan section)   Acute Rehab OT Goals Patient Stated Goal: unable to state OT Goal Formulation: Patient unable to participate in goal setting Time For Goal Achievement: 08/13/24 Potential to Achieve Goals: Fair   OT Frequency:  Min 2X/week    Co-evaluation PT/OT/SLP Co-Evaluation/Treatment: Yes Reason for Co-Treatment: Complexity of the patient's impairments (multi-system involvement);Necessary to address cognition/behavior during functional activity;To address functional/ADL transfers   OT goals addressed during session: ADL's and self-care      AM-PAC OT 6 Clicks Daily Activity     Outcome Measure Help from another person eating meals?: A Lot Help from another person taking care of personal grooming?: A Lot Help from another person toileting, which includes using toliet, bedpan, or urinal?: A Lot Help from another person bathing (including washing, rinsing, drying)?: Total Help from another person to put on and taking off regular upper body clothing?: A Lot Help from another person to put on and taking off regular lower body clothing?: Total 6 Click Score: 10   End of Session Equipment Utilized During Treatment: Gait belt Nurse Communication: Mobility status;Need for lift equipment  Activity Tolerance: Patient tolerated treatment well Patient left: in bed;with call bell/phone within reach;with chair alarm set (lift pad under pt)  OT Visit Diagnosis: Unsteadiness on feet (R26.81);Muscle weakness (generalized) (M62.81);History of falling (Z91.81);Pain Pain - Right/Left:  Left Pain - part of body:  Arm                Time: 9162-9098 OT Time Calculation (min): 24 min Charges:  OT General Charges $OT Visit: 1 Visit OT Evaluation $OT Eval Moderate Complexity: 1 Mod  Kiley Solimine K, OTD, OTR/L SecureChat Preferred Acute Rehab [phone]   Laneta POUR Koonce 08/01/2024, 9:16 AM

## 2024-08-01 NOTE — Progress Notes (Signed)
 Speech Language Pathology Treatment: Dysphagia  Patient Details Name: Danielle Vaughan MRN: [id number] DOB: 1933-07-13 Today's Date: 08/01/2024 Time: 9069-9058 SLP Time Calculation (min) (ACUTE ONLY): 11 min  Assessment / Plan / Recommendation Clinical Impression  Pt was made NPO overnight after results of MRI were positive for acute infarcts. Upon clinical presentation, her swallowing appears to be grossly similar, including limited interest in POs and mild oral cognitive deficits but no overt s/s of aspiration as she can cognitively be challenged. No family present at this time, but from initial evaluation with SLP and per other MD notes, family has opted to continue PO diet with acceptance of possible aspiration and/or reduced nutrition as they do not want artificial feeding. Will reinstate Dys 3 diet and thin liquids. Will f/u briefly for swallowing and education with family.   HPI HPI: 89 yo female presenting to ED 10/29 with L sided weakness and increased confusion x1 week s/p recent fall and yeast infection. MRI severely limited by intolerance and motion artifact, though superimposed ischemic infarcts may be present throughout the posterior frontal, parietal, and occipital lobes. Repeat MRI 11/1 shows multifocal posterior circulation predominant infarcts. Family reports recent oral deficits characterized by chewing pills and blowing rather than sucking through a straw. PMH includes HTN, prior CVA, HLD, pre-diabetes, CKD II, partial seizures, dementia      SLP Plan  Continue with current plan of care          Recommendations  Diet recommendations: Dysphagia 3 (mechanical soft);Thin liquid Liquids provided via: Cup;Straw Medication Administration: Whole meds with puree Supervision: Staff to assist with self feeding;Full supervision/cueing for compensatory strategies Compensations: Minimize environmental distractions;Slow rate;Small sips/bites Postural Changes and/or Swallow Maneuvers:  Seated upright 90 degrees                  Oral care QID     Dysphagia, unspecified (R13.10)     Continue with current plan of care     Leita SAILOR., M.A. CCC-SLP Acute Rehabilitation Services Office: [phone]  Secure chat preferred   08/01/2024, 11:07 AM

## 2024-08-01 NOTE — Progress Notes (Addendum)
 Chart[employer] Loss Adjuster educated family member at bedside of the CVA pamphlet to best of abilities and to the level of comprehension. Son had several questions that was answered satisfactory while CVA material left at bedside for reinforcements.

## 2024-08-01 NOTE — Progress Notes (Addendum)
 PROGRESS NOTE    Danielle Vaughan  FMW:[id number] DOB: [birth year] DOA: 07/28/2024 PCP: [name], MD   Chief Complaint  Patient presents with   Weakness    Brief Narrative:   89 year old female with past medical history of stroke, hypertension, hyperlipidemia, pre-diabetes, CKD II, partial seizures, dementia with baseline orientation to self and place presents to ED with stroke like symptoms.    Reportedly, patient arriving from her doctor's office after she was noted to have stroke-like symptoms with left sided weakness and confusion above her baseline for about 1 week. She had a fall 9 days prior and has had weakness with ambulating since. Additionally, family reports she is being treated for yeast infection with fluconazole.   In ED, afebrile, WBC 10.3, hgb 11.2, plt 543. Notably sodium 119*, K 4.8, Cl 86, bicarb 18, serum osmolality 252. CT head with no acute findings. Neurology was consulted and recommended MRI f/u. Additionally, patient started on 3% saline @ 50cc/hr. CCM consulted for admission.    Baseline per family is she does not always know answers to orientation questions. She sometimes hallucinates. She recently has been chewing rather than swallowing pills and blowing into a straw rather than drinking out of it.   Assessment & Plan:   Principal Problem:   Acute hyponatremia  Hypotonic hyponatremia - This is most likely due to to poor oral intake. - Managed initially in ICU, requiring hypertonic saline.  improved from 119-127 initially -Being down again to 122, nephrology input greatly appreciated, SIADH picture, so for now continue with salt tablets and fluid restrictions, improving. - TSH 0.472, cortisol 19.3  Acute metabolic encephalopathy with underlying significant baseline dementia -Multifactorial factorial, in the setting of UTI, hyponatremia.  With significant underlying dementia - MRI with significant motion degradation, repeat MRI confirms acute  CVA - B12 is borderline, will start on supplements  Acute CVA - MRI obtained 11/1 confirms acute CVA. - Neurology input greatly appreciated. - Echo, CTA head and neck pending. - Appears of embolic origin, please see discussion below regarding SVT/A-flutter - Stroke team is following - With hypotension, but she remains on beta-blockers to control her heart rate, so we will start on midodrine as it is important for her to continue her metoprolol  Paroxysmal SVT  - Patient with known history of SVT, followed by cardiology as an outpatient. - She had recurrent SVTs, continue with low-dose metoprolol  A flutter - She had significant event of SVT from 4:49 AM till 5:15 AM 11/2, discussed with Dr. Inocencio who reviewed the strip, and that event related to a flutter. - Recommendation for outpatient A-fib clinic referral. - Await final neurology recommendation regarding anticoagulation  Hyperlipidemia  Hypertension  - Blood pressure on the lower side, so we will continue to hold home meds goading amlodipine, metoprolol and benazepril   UTI -Continue with Rocephin     Hypotension - On midodrine, avoid hypotension Prediabetes  - A1c 1c 5.3  Remote h/o partial seizures; not on AEDs Dementia  History of occipital stroke  -Tried Namenda but was dizzy so stopped in the past - cont ASA  - delirium precautions     Left hand swelling/pain -Check x-ray, if negative will try colchicine   DVT prophylaxis: (hEPARIN Code Status: (Full) Family Communication: (Discussed with son at bedside and daughter by phone) Disposition:   Status is: Inpatient    Consultants:  Note   Subjective:  She had a good night sleep, appetite has improved, pain in the left wrist/hand  Objective: Vitals:   08/01/24 0454 08/01/24 0500 08/01/24 0527 08/01/24 0743  BP: 100/71   98/66  Pulse: (!) 140   82  Resp: 19 18 19    Temp:    97.6 F (36.4 C)  TempSrc:    Oral  SpO2:    100%  Weight:       Height:       No intake or output data in the 24 hours ending 08/01/24 1052  Filed Weights   07/29/24 0410 07/30/24 0500 08/01/24 0400  Weight: 51.1 kg 51.5 kg 54.5 kg    Examination:   Awake Alert, pleasant, conversant, no apparent distress, frail Good air entry RRR,No Gallops,Rubs or new Murmurs +ve B.Sounds, Abd Soft, No tenderness, No rebound - guarding or rigidity. No Cyanosis, Clubbing or edema, left hand swelling/edema      Data Reviewed: I have personally reviewed following labs and imaging studies  CBC: Recent Labs  Lab 07/28/24 1528 07/28/24 1533 07/29/24 1048 07/31/24 0224 08/01/24 0338  WBC 10.3  --  9.1 12.0* 9.7  NEUTROABS 7.1  --   --   --   --   HGB 11.2* 11.6* 12.3 9.6* 9.1*  HCT 31.6* 34.0* 35.8* 27.4* 26.3*  MCV 88.3  --  89.1 88.4 89.2  PLT 543*  --  542* 552* 528*    Basic Metabolic Panel: Recent Labs  Lab 07/29/24 0817 07/29/24 1711 07/30/24 0548 07/31/24 0224 07/31/24 1749 07/31/24 1959 08/01/24 0338 08/01/24 0954  NA 127*   < > 125* 122* 121* 122* 127*  125* 124*  K 4.3  --  4.0 4.0 3.9  --  3.9  --   CL 90*  --  90* 90* 91*  --  93*  --   CO2 20*  --  20* 21* 20*  --  21*  --   GLUCOSE 89  --  111* 109* 156*  --  92  --   BUN 12  --  12 12 22   --  17  --   CREATININE 0.58  --  0.71 0.59 0.58  --  0.51  --   CALCIUM 8.7*  --  7.9* 7.7* 7.6*  --  7.9*  --   MG 1.8  --  2.1  --   --   --  2.0  --   PHOS 3.7  --  3.4  --   --   --  3.1  --    < > = values in this interval not displayed.    GFR: Estimated Creatinine Clearance: 36 mL/min (by C-G formula based on SCr of 0.51 mg/dL).  Liver Function Tests: Recent Labs  Lab 07/28/24 1528  AST 22  ALT 13  ALKPHOS 55  BILITOT 0.8  PROT 6.7  ALBUMIN 3.0*    CBG: Recent Labs  Lab 07/28/24 2032 07/28/24 2145 07/28/24 2326 07/29/24 0317 07/29/24 0746  GLUCAP 117* 109* 111* 94 83     Recent Results (from the past 240 hours)  MRSA Next Gen by PCR, Nasal     Status:  None   Collection Time: 07/28/24  7:02 PM   Specimen: Nasal Mucosa; Nasal Swab  Result Value Ref Range Status   MRSA by PCR Next Gen NOT DETECTED NOT DETECTED Final    Comment: (NOTE) The GeneXpert MRSA Assay (FDA approved for NASAL specimens only), is one component of a comprehensive MRSA colonization surveillance program. It is not intended to diagnose MRSA infection nor to guide or monitor treatment  for MRSA infections. Test performance is not FDA approved in patients less than 81 years old. Performed at [facility], [address] [place], [place] [address]   Culture, blood (Routine X 2) w Reflex to ID Panel     Status: None (Preliminary result)   Collection Time: 07/28/24  8:25 PM   Specimen: BLOOD LEFT FOREARM  Result Value Ref Range Status   Specimen Description BLOOD LEFT FOREARM  Final   Special Requests   Final    BOTTLES DRAWN AEROBIC AND ANAEROBIC Blood Culture results may not be optimal due to an inadequate volume of blood received in culture bottles   Culture   Final    NO GROWTH 4 DAYS Performed at [facility], [address] [place], [place] [address]    Report Status PENDING  Incomplete  Culture, blood (Routine X 2) w Reflex to ID Panel     Status: None (Preliminary result)   Collection Time: 07/28/24  8:35 PM   Specimen: BLOOD  Result Value Ref Range Status   Specimen Description BLOOD LEFT ANTECUBITAL  Final   Special Requests   Final    BOTTLES DRAWN AEROBIC AND ANAEROBIC Blood Culture results may not be optimal due to an inadequate volume of blood received in culture bottles   Culture   Final    NO GROWTH 4 DAYS Performed at [facility], [address], [place], [place] [address]    Report Status PENDING  Incomplete         Radiology Studies: MR BRAIN WO CONTRAST Result Date: 07/31/2024 EXAM: MRI BRAIN WITHOUT CONTRAST 07/31/2024 05:10:47 PM TECHNIQUE: Multiplanar multisequence MRI of the head/brain was performed without the  administration of intravenous contrast. COMPARISON: 07/28/2024 CLINICAL HISTORY: Patient with left hand weakness, left tongue deviation, concern of acute CVA, aware of recent MRI, but was poor quality, currently patient is more cooperative. FINDINGS: BRAIN AND VENTRICLES: Resolving diffusion abnormalities within the posterior hemispheres. No new ischemic lesions. Old right occipital infarct. Early confluent hyperintense T2-weighted signal within the cerebral white matter, most commonly due to chronic small vessel disease. No intracranial hemorrhage. No mass. No midline shift. No hydrocephalus. The sella is unremarkable. Normal flow voids. ORBITS: Ocular lens replacements. No acute abnormality. SINUSES AND MASTOIDS: No acute abnormality. BONES AND SOFT TISSUES: Normal marrow signal. No acute soft tissue abnormality. IMPRESSION: 1. Resolving diffusion abnormalities within the posterior hemispheres with no new ischemic lesions. 2. Early confluent T2 hyperintense signal within the cerebral white matter, most consistent with chronic small vessel disease. Electronically signed by: Franky Stanford MD 07/31/2024 05:38 PM EDT RP Workstation: [id number]         Scheduled Meds:  aspirin EC  81 mg Oral Daily   Chlorhexidine Gluconate Cloth  6 each Topical Daily   cyanocobalamin   1,000 mcg Subcutaneous Daily   feeding supplement  237 mL Oral BID BM   fluconazole  150 mg Oral Daily   heparin  5,000 Units Subcutaneous Q8H   metoprolol tartrate  12.5 mg Oral Q6H   midodrine  10 mg Oral TID WC   sodium chloride  2 g Oral TID WC   Continuous Infusions:     LOS: 4 days       Brayton Lye, MD [facility]   To contact the attending provider between 7A-7P or the covering provider during after hours 7P-7A, please log into the web site [email or web address] and access using universal  password for that web site. If  you do not have the password, please call the hospital operator.  08/01/2024,  10:52 AM

## 2024-08-01 NOTE — Progress Notes (Signed)
 [facility] night cross cover note:   I was notified by the patient's RN that this patient, with paroxysmal SVT, including multiple runs of SVT during his current hospitalization, exhibited evidence of A-fib and a flutter with RVR, with sustained heart rates in the 140s bpm.  She was reported to be asymptomatic at that time, with systolic blood pressures in the low 100s mmHg.   Initially she was refusing her scheduled oral metoprolol to tartrate 12.5 mg p.o. every 6 hours, but ultimately took this medication.  Ensuing heart rate has improved into the 90s, which is reported to be baseline for this patient.  I subsequently added as needed IV Lopressor for sustained heart rates greater than 130 bpm.      Eva Pore, DO Hospitalist

## 2024-08-01 NOTE — Progress Notes (Addendum)
 [facility] Progress Note  Subjective:  UOP yest 475 cc Na+ was 125-127 early this am and at 10 am was down to 124 Taking all her pills crushed in applesauce, including salt tablets RN says she is taking the salt pills   Vitals:   08/01/24 0454 08/01/24 0500 08/01/24 0527 08/01/24 0743  BP: 100/71   98/66  Pulse: (!) 140   82  Resp: 19 18 19    Temp:    97.6 F (36.4 C)  TempSrc:    Oral  SpO2:    100%  Weight:      Height:        Exam: Gen alert, no distress No jvd or bruits Chest clear bilat to bases RRR no MRG Abd soft ntnd no mass or ascites +bs Ext no LE edema, no other edema Neuro is alert, nf       Home meds: Norvasc, benazepril, advil, toprol xl, diflucan, ketoconazole, menthol topical   IP meds of interest: Midodrine 5 mg tid started here for hypotension   Serum osm 252 ( on admit) Cortisol 19, TSH 0.472 UN 69, UOsm 318   Assessment/ Plan: Hyponatremia: looks euvolemic on exam. Urine sodium was 69, UOsm 318, suggesting SIADH picture. TSH and cortisol wnl, no signs of cirrhosis/ CHF/ renal failure. Responded to 3% saline earlier here. Don't see any medications that would cause this. Unclear cause. Cont to hold acei for another 2-3 weeks. Na+ was up to 125-127 this am, then down to 124 at 10am. RN states she has taken all her salt tabs (crushed in applesauce). Will continue NaCl 2 gm tid and fluid restriction (1000-1200 cc/d). If next Na is not any better, will try tolvaptan.  Dementia AMS HTN: as above UTI: on rocephin H/o partial seizures: not on medication H/o CVA       Rob Geralynn MD  [facility] 08/01/2024, 12:17 PM  Recent Labs  Lab 07/28/24 1528 07/28/24 1533 07/30/24 0548 07/31/24 0224 07/31/24 1749 08/01/24 0338  HGB 11.2*   < >  --  9.6*  --  9.1*  ALBUMIN 3.0*  --   --   --   --   --   CALCIUM 8.4*   < > 7.9* 7.7* 7.6* 7.9*  PHOS  --    < > 3.4  --   --  3.1  CREATININE 0.86   < > 0.71 0.59 0.58 0.51  K 4.8   < > 4.0 4.0  3.9 3.9   < > = values in this interval not displayed.   No results for input(s): IRON, TIBC, FERRITIN in the last 168 hours. Inpatient medications:  aspirin EC  81 mg Oral Daily   Chlorhexidine Gluconate Cloth  6 each Topical Daily   cyanocobalamin   1,000 mcg Subcutaneous Daily   feeding supplement  237 mL Oral BID BM   fluconazole  150 mg Oral Daily   heparin  5,000 Units Subcutaneous Q8H   metoprolol tartrate  12.5 mg Oral Q6H   midodrine  10 mg Oral TID WC   sodium chloride  2 g Oral TID WC    acetaminophen , docusate sodium, metoprolol tartrate, mouth rinse, polyethylene glycol

## 2024-08-01 NOTE — Plan of Care (Signed)
  Problem: Education: Goal: Knowledge of General Education information will improve Description: Including pain rating scale, medication(s)/side effects and non-pharmacologic comfort measures Outcome: Progressing   Problem: Activity: Goal: Risk for activity intolerance will decrease Outcome: Progressing   Problem: Elimination: Goal: Will not experience complications related to bowel motility Outcome: Progressing   Problem: Safety: Goal: Ability to remain free from injury will improve Outcome: Progressing   Problem: Nutrition: Goal: Risk of aspiration will decrease Outcome: Progressing Goal: Dietary intake will improve Outcome: Progressing

## 2024-08-01 NOTE — Progress Notes (Signed)
 [facility] night cross cover note:  *** alled by radiology that there is an incidental possibl ePE finding on the CTA head and neck on this patient, they are recommending CTA chest   I would not object to anticoagulation if it is needed as symptoms have been present for well over a week, but would still use stroke protocol heparin. ***    Eva Pore, DO Hospitalist

## 2024-08-02 ENCOUNTER — Other Ambulatory Visit (HOSPITAL_COMMUNITY): Payer: Self-pay

## 2024-08-02 ENCOUNTER — Telehealth (HOSPITAL_COMMUNITY): Payer: Self-pay

## 2024-08-02 DIAGNOSIS — Z7189 Other specified counseling: Secondary | ICD-10-CM | POA: Diagnosis not present

## 2024-08-02 DIAGNOSIS — Z515 Encounter for palliative care: Secondary | ICD-10-CM | POA: Diagnosis not present

## 2024-08-02 DIAGNOSIS — I700 Atherosclerosis of aorta: Secondary | ICD-10-CM | POA: Diagnosis not present

## 2024-08-02 DIAGNOSIS — K449 Diaphragmatic hernia without obstruction or gangrene: Secondary | ICD-10-CM | POA: Diagnosis not present

## 2024-08-02 DIAGNOSIS — E871 Hypo-osmolality and hyponatremia: Secondary | ICD-10-CM | POA: Diagnosis not present

## 2024-08-02 DIAGNOSIS — I2699 Other pulmonary embolism without acute cor pulmonale: Secondary | ICD-10-CM | POA: Diagnosis not present

## 2024-08-02 LAB — CBC
HCT: 25.4 % — ABNORMAL LOW (ref 36.0–46.0)
Hemoglobin: 8.8 g/dL — ABNORMAL LOW (ref 12.0–15.0)
MCH: 31.1 pg (ref 26.0–34.0)
MCHC: 34.6 g/dL (ref 30.0–36.0)
MCV: 89.8 fL (ref 80.0–100.0)
Platelets: 598 K/uL — ABNORMAL HIGH (ref 150–400)
RBC: 2.83 MIL/uL — ABNORMAL LOW (ref 3.87–5.11)
RDW: 17.2 % — ABNORMAL HIGH (ref 11.5–15.5)
WBC: 9.2 K/uL (ref 4.0–10.5)
nRBC: 0 % (ref 0.0–0.2)

## 2024-08-02 LAB — CULTURE, BLOOD (ROUTINE X 2)
Culture: NO GROWTH
Culture: NO GROWTH

## 2024-08-02 LAB — PHOSPHORUS: Phosphorus: 4.1 mg/dL (ref 2.5–4.6)

## 2024-08-02 LAB — BASIC METABOLIC PANEL WITH GFR
Anion gap: 10 (ref 5–15)
BUN: 10 mg/dL (ref 8–23)
CO2: 22 mmol/L (ref 22–32)
Calcium: 7.7 mg/dL — ABNORMAL LOW (ref 8.9–10.3)
Chloride: 96 mmol/L — ABNORMAL LOW (ref 98–111)
Creatinine, Ser: 0.63 mg/dL (ref 0.44–1.00)
GFR, Estimated: >60 mL/min (ref >60)
Glucose, Bld: 78 mg/dL (ref 70–99)
Potassium: 4 mmol/L (ref 3.5–5.1)
Sodium: 128 mmol/L — ABNORMAL LOW (ref 135–145)

## 2024-08-02 LAB — MAGNESIUM: Magnesium: 1.8 mg/dL (ref 1.7–2.4)

## 2024-08-02 MED ORDER — PROSIGHT PO TABS
1.0000 | ORAL_TABLET | Freq: Two times a day (BID) | ORAL | Status: DC
  Administered 2024-08-02 – 2024-08-05 (×6): 1 via ORAL
  Filled 2024-08-02 (×6): qty 1

## 2024-08-02 MED ORDER — PANTOPRAZOLE SODIUM 40 MG PO TBEC
40.0000 mg | DELAYED_RELEASE_TABLET | Freq: Every day | ORAL | Status: DC
Start: 2024-08-02 — End: 2024-08-05
  Administered 2024-08-02 – 2024-08-05 (×4): 40 mg via ORAL
  Filled 2024-08-02 (×4): qty 1

## 2024-08-02 MED ORDER — APIXABAN 5 MG PO TABS
5.0000 mg | ORAL_TABLET | Freq: Two times a day (BID) | ORAL | Status: DC
  Administered 2024-08-02 – 2024-08-05 (×7): 5 mg via ORAL
  Filled 2024-08-02 (×7): qty 1

## 2024-08-02 MED ORDER — IOHEXOL 350 MG/ML SOLN
75.0000 mL | Freq: Once | INTRAVENOUS | Status: AC | PRN
  Administered 2024-08-02: 75 mL via INTRAVENOUS

## 2024-08-02 MED ORDER — METOPROLOL TARTRATE 12.5 MG HALF TABLET
12.5000 mg | ORAL_TABLET | Freq: Two times a day (BID) | ORAL | Status: DC
  Administered 2024-08-02 – 2024-08-05 (×7): 12.5 mg via ORAL
  Filled 2024-08-02 (×7): qty 1

## 2024-08-02 NOTE — TOC CAGE-AID Note (Signed)
 Transition of Care [facility]) - CAGE-AID Screening   Patient Details  Name: Danielle Vaughan MRN: [id number] Date of Birth: 1933/05/27  [facility] CM/SW Contact:    Inocente GORMAN Kindle, LCSW Phone Number: 08/02/2024, 9:57 AM   Clinical Narrative: Patient is disoriented and unable to participate in screening at this time.    CAGE-AID Screening: Substance Abuse Screening unable to be completed due to: : Patient unable to participate

## 2024-08-02 NOTE — Care Management Important Message (Signed)
 Important Message  Patient Details  Name: Danielle Vaughan MRN: [id number] Date of Birth: 05-May-1933   Important Message Given:  Yes - Medicare IM     [name] 08/02/2024, 4:19 PM

## 2024-08-02 NOTE — Progress Notes (Signed)
 Heart Failure Navigator Progress Note  Assessed for Heart & Vascular TOC clinic readiness.  Patient does not meet criteria due to per MD note , patient with history of Dementia. .   Navigator will sign off at this time.   Stephane Haddock, BSN, Scientist, clinical (histocompatibility and immunogenetics) Only

## 2024-08-02 NOTE — Plan of Care (Signed)

## 2024-08-02 NOTE — Progress Notes (Signed)
 PROGRESS NOTE    Danielle Vaughan  FMW:[id number] DOB: Sep 28, 1933 DOA: 07/28/2024 PCP: [name], MD   Chief Complaint  Patient presents with   Weakness    Brief Narrative:   89 year old female with past medical history of stroke, hypertension, hyperlipidemia, pre-diabetes, CKD II, partial seizures, dementia with baseline orientation to self and place presents to ED with stroke like symptoms.    Reportedly, patient arriving from her doctor's office after she was noted to have stroke-like symptoms with left sided weakness and confusion above her baseline for about 1 week. She had a fall 9 days prior and has had weakness with ambulating since. Additionally, family reports she is being treated for yeast infection with fluconazole. - ED her workup significant for hyponatremia, UTI, she remains quite encephalopathic, initial MRI brain poor quality due to her moving, repeat MRI confirms acute CVA, as well workup significant for subsegmental PE.    Assessment & Plan:   Principal Problem:   Acute hyponatremia  Hypotonic hyponatremia - This is most likely due to to poor oral intake. - Managed initially in ICU, requiring hypertonic saline.  improved from 119-127 initially - nephrology input greatly appreciated, SIADH picture, so for now continue with salt tablets and fluid restrictions, improving. - TSH 0.472, cortisol 19.3  Acute metabolic encephalopathy with underlying significant baseline dementia -Multifactorial factorial, in the setting of UTI, hyponatremia.  With significant underlying dementia - MRI with significant motion degradation, repeat MRI confirms acute CVA - B12 is borderline, will start on supplements  Acute CVA - MRI obtained 11/1 confirms acute CVA. - Neurology input greatly appreciated. - Echo, CTA head and neck pending. - Appears of embolic origin, please see discussion below regarding SVT/A-flutter - Stroke team is following - With hypotension, but she  remains on beta-blockers to control her heart rate, so we will start on midodrine as it is important for her to continue her metoprolol  Paroxysmal SVT  - Patient with known history of SVT, followed by cardiology as an outpatient. - She had recurrent SVTs, continue with low-dose metoprolol  A flutter - She had significant event of SVT from 4:49 AM till 5:15 AM 11/2, discussed with Dr. Inocencio who reviewed the strip, and that event related to a flutter. - Recommendation for outpatient A-fib clinic referral. - Initially contemplating anticoagulation risks versus benefits due to her blindness and fall, but now with new diagnosis of PE will start on anticoagulation.  Acute PE -CTA significant for subsegmental PE, will start on Eliquis   Hyperlipidemia  Hypertension  - Blood pressure on the lower side, so we will continue to hold home meds including amlodipine, metoprolol and benazepril   UTI - Treated with Rocephin   Hypotension - On midodrine, avoid hypotension setting of acute CVA  Prediabetes  - A1c 1c 5.3  Remote h/o partial seizures; not on AEDs Dementia  History of occipital stroke  -Tried Namenda but was dizzy so stopped in the past - Will stop aspirin now she is started on Eliquis - delirium precautions    Left hand swelling/pain -  x-ray is negative, nontender   DVT prophylaxis: Eliquis Code Status: (Full) Family Communication: (Discussed with daughter at bedside   Disposition:   Status is: Inpatient    Consultants:  Neurology   Subjective:  No significant events overnight as discussed with staff, patient denies any complaints this morning  Objective: Vitals:   08/02/24 0400 08/02/24 0435 08/02/24 0500 08/02/24 0736  BP: (!) 92/56 116/77  (!) 03/40  Pulse: 70 78 64   Resp: (!) 23 (!) 22 (!) 22   Temp: 98.5 F (36.9 C)   97.6 F (36.4 C)  TempSrc: Oral   Oral  SpO2: 95% 97% 95%   Weight:   55 kg   Height:        Intake/Output Summary (Last  24 hours) at 08/02/2024 0915 Last data filed at 08/01/2024 2300 Gross per 24 hour  Intake --  Output 300 ml  Net -300 ml    Filed Weights   07/30/24 0500 08/01/24 0400 08/02/24 0500  Weight: 51.5 kg 54.5 kg 55 kg    Examination:   Awake Alert, pleasant, conversant, no apparent distress, frail Good air entry RRR,No Gallops,Rubs or new Murmurs +ve B.Sounds, Abd Soft, No tenderness, No rebound - guarding or rigidity. No Cyanosis, Clubbing or edema, left hand swelling/edema  Awake Alert, does not, no apparent distress Good air entry Regular rate and rhythm Positive bowel sounds No Cyanosis, Clubbing, mild right lower extremity edema     Data Reviewed: I have personally reviewed following labs and imaging studies  CBC: Recent Labs  Lab 07/28/24 1528 07/28/24 1533 07/29/24 1048 07/31/24 0224 08/01/24 0338 08/02/24 0317  WBC 10.3  --  9.1 12.0* 9.7 9.2  NEUTROABS 7.1  --   --   --   --   --   HGB 11.2* 11.6* 12.3 9.6* 9.1* 8.8*  HCT 31.6* 34.0* 35.8* 27.4* 26.3* 25.4*  MCV 88.3  --  89.1 88.4 89.2 89.8  PLT 543*  --  542* 552* 528* 598*    Basic Metabolic Panel: Recent Labs  Lab 07/29/24 0817 07/29/24 1711 07/30/24 0548 07/31/24 0224 07/31/24 1749 07/31/24 1959 08/01/24 0338 08/01/24 0954 08/01/24 1431 08/01/24 1731 08/01/24 2237 08/02/24 0317  NA 127*   < > 125* 122* 121*   < > 127*  125* 124* 128* 130* 129* 128*  K 4.3  --  4.0 4.0 3.9  --  3.9  --   --   --   --  4.0  CL 90*  --  90* 90* 91*  --  93*  --   --   --   --  96*  CO2 20*  --  20* 21* 20*  --  21*  --   --   --   --  22  GLUCOSE 89  --  111* 109* 156*  --  92  --   --   --   --  78  BUN 12  --  12 12 22   --  17  --   --   --   --  10  CREATININE 0.58  --  0.71 0.59 0.58  --  0.51  --   --   --   --  0.63  CALCIUM 8.7*  --  7.9* 7.7* 7.6*  --  7.9*  --   --   --   --  7.7*  MG 1.8  --  2.1  --   --   --  2.0  --   --   --   --  1.8  PHOS 3.7  --  3.4  --   --   --  3.1  --   --   --   --   4.1   < > = values in this interval not displayed.    GFR: Estimated Creatinine Clearance: 36 mL/min (by C-G formula based on SCr of 0.63 mg/dL).  Liver  Function Tests: Recent Labs  Lab 07/28/24 1528  AST 22  ALT 13  ALKPHOS 55  BILITOT 0.8  PROT 6.7  ALBUMIN 3.0*    CBG: Recent Labs  Lab 07/28/24 2032 07/28/24 2145 07/28/24 2326 07/29/24 0317 07/29/24 0746  GLUCAP 117* 109* 111* 94 83     Recent Results (from the past 240 hours)  MRSA Next Gen by PCR, Nasal     Status: None   Collection Time: 07/28/24  7:02 PM   Specimen: Nasal Mucosa; Nasal Swab  Result Value Ref Range Status   MRSA by PCR Next Gen NOT DETECTED NOT DETECTED Final    Comment: (NOTE) The GeneXpert MRSA Assay (FDA approved for NASAL specimens only), is one component of a comprehensive MRSA colonization surveillance program. It is not intended to diagnose MRSA infection nor to guide or monitor treatment for MRSA infections. Test performance is not FDA approved in patients less than 110 years old. Performed at [facility], [address] [place], [place] [address]   Culture, blood (Routine X 2) w Reflex to ID Panel     Status: None   Collection Time: 07/28/24  8:25 PM   Specimen: BLOOD LEFT FOREARM  Result Value Ref Range Status   Specimen Description BLOOD LEFT FOREARM  Final   Special Requests   Final    BOTTLES DRAWN AEROBIC AND ANAEROBIC Blood Culture results may not be optimal due to an inadequate volume of blood received in culture bottles   Culture   Final    NO GROWTH 5 DAYS Performed at [facility], [address] [place], [place] [address]    Report Status 08/02/2024 FINAL  Final  Culture, blood (Routine X 2) w Reflex to ID Panel     Status: None   Collection Time: 07/28/24  8:35 PM   Specimen: BLOOD  Result Value Ref Range Status   Specimen Description BLOOD LEFT ANTECUBITAL  Final   Special Requests   Final    BOTTLES DRAWN AEROBIC AND ANAEROBIC Blood Culture  results may not be optimal due to an inadequate volume of blood received in culture bottles   Culture   Final    NO GROWTH 5 DAYS Performed at [facility], [address] [place], [place] [address]    Report Status 08/02/2024 FINAL  Final         Radiology Studies: CT Angio Chest Pulmonary Embolism (PE) W or WO Contrast Result Date: 08/02/2024 EXAM: CTA of the Chest with contrast for PE 08/02/2024 12:10:16 AM TECHNIQUE: CTA of the chest was performed after the administration of intravenous contrast. Multiplanar reformatted images are provided for review. MIP images are provided for review. Automated exposure control, iterative reconstruction, and/or weight based adjustment of the mA/kV was utilized to reduce the radiation dose to as low as reasonably achievable. COMPARISON: AP and lateral chest 11/23/2022, and same day CT angiography (CTA) of the head and neck images. CLINICAL HISTORY: FINDINGS: PULMONARY ARTERIES: Pulmonary arteries are adequately opacified for evaluation. There is a subsegmental branch point arterial embolus in the right upper lobe on series 6 axial images 40 through 42 corresponding to the finding noted on the CTA head and neck. No further arterial emboli are identified. The pulmonary arteries and veins are normal caliber. Main pulmonary artery is normal in caliber. MEDIASTINUM: Mild cardiomegaly with left chamber predominance and no evidence of acute right heart strain. There is a small amount of scattered single vessel calcification in the  proximal LAD. Mild atherosclerosis and tortuosity of the aorta and great vessels without aneurysm, stenosis, or dissection. There is a 2-vessel aortic arch with a normal variant brachiocephalic trunk. LYMPH NODES: No mediastinal, hilar or axillary lymphadenopathy. LUNGS AND PLEURA: There is a posterior right diaphragmatic fat herniation into the lower chest. There is mild scattered subpleural reticulation in the upper lobes, mild  posterior passive atelectasis in both lungs. There is no consolidation, effusion, pneumothorax, or suspicious lung nodule. UPPER ABDOMEN: There are Bosniak 1 cysts in the upper pole of the kidneys, with a 3 cm cyst measuring 18 Hounsfield units on series 6 axial 127 left upper pole, additional cysts in the right upper pole largest is 2 cm and 20 Hounsfield units. No follow-up imaging is recommended. No other significant upper abdominal findings. SOFT TISSUES AND BONES: There is osteopenia and advanced degenerative change of the thoracic spine. There are mild chronic wedge compression deformities of the T12 and L1 vertebral bodies, also seen on a lumbar spine CT from 09/22/2023. Slight thoracic kyphodextroscoliosis. No acute soft tissue abnormality. IMPRESSION: 1. Subsegmental pulmonary embolus in the right upper lobe, with no additional pulmonary emboli identified. 2. Mild cardiomegaly without evidence of acute right heart strain. 3. Mild chronic changes in the upper lobes . No acute chest CT or CTA findings . 4. Advanced degenerative changes of the thoracic spine. Electronically signed by: Francis Quam MD 08/02/2024 12:54 AM EST RP Workstation: [id number]   CT ANGIO HEAD NECK W WO CM Result Date: 08/01/2024 CLINICAL DATA:  Follow-up examination for stroke. EXAM: CT ANGIOGRAPHY HEAD AND NECK WITH AND WITHOUT CONTRAST TECHNIQUE: Multidetector CT imaging of the head and neck was performed using the standard protocol during bolus administration of intravenous contrast. Multiplanar CT image reconstructions and MIPs were obtained to evaluate the vascular anatomy. Carotid stenosis measurements (when applicable) are obtained utilizing NASCET criteria, using the distal internal carotid diameter as the denominator. RADIATION DOSE REDUCTION: This exam was performed according to the departmental dose-optimization program which includes automated exposure control, adjustment of the mA and/or kV according to patient size  and/or use of iterative reconstruction technique. CONTRAST:  75mL OMNIPAQUE IOHEXOL 350 MG/ML SOLN COMPARISON:  Comparison made with MRI from 07/31/2024 as well as earlier studies. FINDINGS: CT HEAD FINDINGS Brain: Atrophy with advanced chronic microvascular ischemic disease again noted. Scattered areas of chronic cortical mineralization involving the right greater than left parieto-occipital regions, with additional involvement of the perirolandic areas and mesial temporal lobes, stable from prior. Previously noted superimposed ischemic changes not well visualized by CT. No other acute large vessel territory infarct. No acute intracranial hemorrhage. No mass lesion or midline shift. No hydrocephalus or extra-axial fluid collection. Vascular: No abnormal hyperdense vessel. Calcified atherosclerosis present at the skull base. Skull: Scalp soft tissues and calvarium demonstrate no new finding. Sinuses/Orbits: Globes orbital soft tissues within normal limits. Right maxillary sinus retention cyst noted. Minimal layering secretions noted within the left sphenoid sinus. No mastoid effusion. Other: None. Review of the MIP images confirms the above findings CTA NECK FINDINGS Aortic arch: Visualized aortic arch within normal limits for caliber. Bovine branching pattern noted. A atheromatous change present about the aortic arch. No high-grade stenosis about the origin the great vessels. Right carotid system: Right common and internal carotid arteries are tortuous. No dissection. Bulky calcified plaque about the right carotid bulb with associated stenosis of up to 55% by NASCET criteria. Left carotid system: Left common and internal carotid arteries are tortuous. No dissection. Moderate calcified plaque  about the left carotid bulb without hemodynamically significant greater than 50% stenosis. Vertebral arteries: Both vertebral arteries arise from subclavian arteries. Vertebral arteries are tortuous bilaterally. No stenosis or  dissection. Skeleton: No worrisome osseous lesions. Advanced multilevel cervical spondylosis without high-grade spinal stenosis. Other neck: No other acute finding. Upper chest: There is a questionable filling defect within a pulmonary arterial vessel at the right upper lobe (series 10, image 297). While this could be related to timing the contrast bolus, a possible small acute pulmonary embolism is difficult to exclude. No other acute finding. Review of the MIP images confirms the above findings CTA HEAD FINDINGS Anterior circulation: Vermis atheromatous change about the carotid siphons without hemodynamically significant stenosis. A1 segments patent bilaterally. Normal anterior communicating artery complex. Anterior cerebral arteries patent without significant stenosis. No M1 stenosis or occlusion. No proximal MCA branch occlusion or high-grade stenosis. Distal MCA branches perfused and symmetric. Posterior circulation: Both V4 segments widely patent without stenosis. Left PICA patent at its origin. Right PICA not well seen. Basilar patent without stenosis. Superior cerebral arteries patent bilaterally. Left PCA supplied via the basilar as well as a prominent left posterior communicating artery. Predominant fetal type origin of the right PCA supplied via the right PCOM, although note is made of a probable occluded hypoplastic right P1 segment (series 10, image 102). Right PCA widely patent distally. Venous sinuses: Not well assessed due to arterial timing the contrast bolus. Anatomic variants: As above.  No aneurysm. Review of the MIP images confirms the above findings IMPRESSION: CT HEAD: 1. No acute intracranial abnormality. 2. Atrophy with advanced chronic microvascular ischemic disease. Scattered areas of chronic cortical mineralization involving the right greater than left parieto-occipital regions, with additional involvement of the perirolandic areas and mesial temporal lobes, stable from prior. Previously  noted superimposed ischemic changes not well visualized by CT. CTA HEAD AND NECK: 1. Negative CTA for large vessel occlusion or other emergent finding. 2. Bulky calcified plaque about the right carotid bulb with associated stenosis of up to 55% by NASCET criteria. 3. Moderate calcified plaque about the left carotid bulb without hemodynamically significant greater than 50% stenosis. 4. Occluded right P1 segment. There is a robust and widely patent right posterior communicating artery, with wide patency of the right PCA distally. 5. Questionable filling defect within a pulmonary arterial vessel at the right upper lobe. While this could be related to timing the contrast bolus with mixing artifact, a possible small acute pulmonary embolism is difficult to exclude. Further evaluation with dedicated CTA of the chest recommended. 6.  Aortic Atherosclerosis (ICD10-I70.0). These results were communicated to Dr. Michaela at 9:40 pm on 08/01/2024 by text page via the [facility] messaging system. Electronically Signed   By: Morene Hoard M.D.   On: 08/01/2024 21:44   DG Hand 2 View Left Result Date: 08/01/2024 CLINICAL DATA:  Hand pain EXAM: LEFT HAND - 2 VIEW COMPARISON:  None Available. FINDINGS: There is no evidence of fracture or dislocation. Diffuse interphalangeal joint space narrowing, most pronounced at the index and middle finger distal interphalangeal and thumb carpometacarpal joints. Soft tissues are unremarkable. IMPRESSION: 1. No acute fracture or dislocation. 2. Osteoarthritis. Electronically Signed   By: Limin  Xu M.D.   On: 08/01/2024 13:23   ECHOCARDIOGRAM COMPLETE Result Date: 08/01/2024    ECHOCARDIOGRAM REPORT   Patient Name:   Danielle Vaughan Date of Exam: 08/01/2024 Medical Rec #:  [id number]        Height:  61.0 in Accession #:    [id number]       Weight:       120.1 lb Date of Birth:  1933-05-07        BSA:          1.521 m Patient Age:    89 years         BP:           108/69 mmHg Patient  Gender: F                HR:           87 bpm. Exam Location:  Inpatient Procedure: 2D Echo, 3D Echo, Cardiac Doppler and Color Doppler (Both Spectral            and Color Flow Doppler were utilized during procedure). Indications:    Stroke  History:        Patient has prior history of Echocardiogram examinations, most                 recent 12/28/2019.  Sonographer:    Philomena Daring Referring Phys: ANIUS.BANISTER MCNEILL P KIRKPATRICK  Sonographer Comments: Image acquisition challenging due to respiratory motion. IMPRESSIONS  1. Left ventricular ejection fraction, by estimation, is 40 to 45%. Left ventricular ejection fraction by 3D volume is 41 %. The left ventricle has mildly decreased function. The left ventricle demonstrates regional wall motion abnormalities (see scoring diagram/findings for description). There is mild left ventricular hypertrophy of the basal-septal segment. Left ventricular diastolic parameters are consistent with Grade I diastolic dysfunction (impaired relaxation).  2. Right ventricular systolic function is normal. The right ventricular size is normal. There is mildly elevated pulmonary artery systolic pressure. The estimated right ventricular systolic pressure is 45.0 mmHg.  3. The mitral valve is normal in structure. Trivial mitral valve regurgitation. No evidence of mitral stenosis.  4. Tricuspid valve regurgitation is moderate.  5. The aortic valve is normal in structure. Aortic valve regurgitation is not visualized. No aortic stenosis is present.  6. The inferior vena cava is normal in size with greater than 50% respiratory variability, suggesting right atrial pressure of 3 mmHg. Conclusion(s)/Recommendation(s): No intracardiac source of embolism detected on this transthoracic study. Consider a transesophageal echocardiogram to exclude cardiac source of embolism if clinically indicated. FINDINGS  Left Ventricle: Left ventricular ejection fraction, by estimation, is 40 to 45%. Left ventricular  ejection fraction by 3D volume is 41 %. The left ventricle has mildly decreased function. The left ventricle demonstrates regional wall motion abnormalities. The left ventricular internal cavity size was normal in size. There is mild left ventricular hypertrophy of the basal-septal segment. Left ventricular diastolic parameters are consistent with Grade I diastolic dysfunction (impaired relaxation).  LV Wall Scoring: The inferior wall, mid anteroseptal segment, basal inferolateral segment, mid inferoseptal segment, and basal inferoseptal segment are akinetic. Right Ventricle: The right ventricular size is normal. No increase in right ventricular wall thickness. Right ventricular systolic function is normal. There is mildly elevated pulmonary artery systolic pressure. The tricuspid regurgitant velocity is 3.04  m/s, and with an assumed right atrial pressure of 8 mmHg, the estimated right ventricular systolic pressure is 45.0 mmHg. Left Atrium: Left atrial size was normal in size. Right Atrium: Right atrial size was normal in size. Pericardium: There is no evidence of pericardial effusion. Mitral Valve: The mitral valve is normal in structure. Trivial mitral valve regurgitation. No evidence of mitral valve stenosis. Tricuspid Valve: The tricuspid valve is normal in structure. Tricuspid  valve regurgitation is moderate . No evidence of tricuspid stenosis. Aortic Valve: The aortic valve is normal in structure. Aortic valve regurgitation is not visualized. No aortic stenosis is present. Pulmonic Valve: The pulmonic valve was normal in structure. Pulmonic valve regurgitation is not visualized. No evidence of pulmonic stenosis. Aorta: The aortic root is normal in size and structure. Venous: The inferior vena cava is normal in size with greater than 50% respiratory variability, suggesting right atrial pressure of 3 mmHg. IAS/Shunts: No atrial level shunt detected by color flow Doppler.  LEFT VENTRICLE PLAX 2D LVIDd:          3.40 cm LVIDs:         2.70 cm LV PW:         1.10 cm         3D Volume EF LV IVS:        1.20 cm         LV 3D EF:    Left LVOT diam:     2.00 cm                      ventricul LV SV:         52                           ar LV SV Index:   34                           ejection LVOT Area:     3.14 cm                     fraction                                             by 3D                                             volume is                                             41 %.                                 3D Volume EF:                                3D EF:        41 %                                LV EDV:       125 ml                                LV ESV:       74 ml  LV SV:        51 ml RIGHT VENTRICLE             IVC RV Basal diam:  3.30 cm     IVC diam: 2.10 cm RV Mid diam:    2.60 cm RV S prime:     17.40 cm/s TAPSE (M-mode): 2.3 cm LEFT ATRIUM             Index        RIGHT ATRIUM           Index LA diam:        2.70 cm 1.78 cm/m   RA Area:     11.20 cm LA Vol (A2C):   42.5 ml 27.94 ml/m  RA Volume:   23.40 ml  15.38 ml/m LA Vol (A4C):   38.8 ml 25.51 ml/m LA Biplane Vol: 41.1 ml 27.02 ml/m  AORTIC VALVE LVOT Vmax:   115.00 cm/s LVOT Vmean:  76.700 cm/s LVOT VTI:    0.164 m  AORTA Ao Root diam: 2.50 cm Ao Asc diam:  3.10 cm TRICUSPID VALVE TR Peak grad:   37.0 mmHg TR Vmax:        304.00 cm/s  SHUNTS Systemic VTI:  0.16 m Systemic Diam: 2.00 cm Oneil Parchment MD Electronically signed by Oneil Parchment MD Signature Date/Time: 08/01/2024/1:11:32 PM    Final    MR BRAIN WO CONTRAST Result Date: 07/31/2024 EXAM: MRI BRAIN WITHOUT CONTRAST 07/31/2024 05:10:47 PM TECHNIQUE: Multiplanar multisequence MRI of the head/brain was performed without the administration of intravenous contrast. COMPARISON: 07/28/2024 CLINICAL HISTORY: Patient with left hand weakness, left tongue deviation, concern of acute CVA, aware of recent MRI, but was poor quality, currently patient is more  cooperative. FINDINGS: BRAIN AND VENTRICLES: Resolving diffusion abnormalities within the posterior hemispheres. No new ischemic lesions. Old right occipital infarct. Early confluent hyperintense T2-weighted signal within the cerebral white matter, most commonly due to chronic small vessel disease. No intracranial hemorrhage. No mass. No midline shift. No hydrocephalus. The sella is unremarkable. Normal flow voids. ORBITS: Ocular lens replacements. No acute abnormality. SINUSES AND MASTOIDS: No acute abnormality. BONES AND SOFT TISSUES: Normal marrow signal. No acute soft tissue abnormality. IMPRESSION: 1. Resolving diffusion abnormalities within the posterior hemispheres with no new ischemic lesions. 2. Early confluent T2 hyperintense signal within the cerebral white matter, most consistent with chronic small vessel disease. Electronically signed by: Franky Stanford MD 07/31/2024 05:38 PM EDT RP Workstation: [id number]         Scheduled Meds:  apixaban  5 mg Oral BID   Chlorhexidine Gluconate Cloth  6 each Topical Daily   cyanocobalamin   1,000 mcg Subcutaneous Daily   feeding supplement  237 mL Oral BID BM   fluconazole  150 mg Oral Daily   metoprolol tartrate  12.5 mg Oral BID   midodrine  10 mg Oral TID WC   sodium chloride  2 g Oral TID WC   Continuous Infusions:     LOS: 5 days       Brayton Lye, MD [facility]   To contact the attending provider between 7A-7P or the covering provider during after hours 7P-7A, please log into the web site [email or web address] and access using universal [place] password for that web site. If you do not have the password, please call the hospital operator.  08/02/2024, 9:15 AM

## 2024-08-02 NOTE — Care Management Important Message (Signed)
 Important Message  Patient Details  Name: Danielle Vaughan MRN: [id number] Date of Birth: 1933/04/24   Important Message Given:  Yes - Medicare IM     [name] 08/02/2024, 11:50 AM

## 2024-08-02 NOTE — Progress Notes (Signed)
 [facility] Progress Note  10/29 w/ stroke-like symptoms. AMS at baseline, hx of dementia, partial seizures, CKD 2, HTN, HL, hx CVA. She fell 9 days prior and family said was unable to walk since. Was getting abx for UTI. In ED BP 130/78, HR 98-105, RR 16-21, temp 98.6. 98% on RA. Labs showed Na+ 119, BUN 25, creat 1.00, alb 3.0, lft's negative. WBC 10K, Hb 11.6. Neuro was consulted. CCM gave pt 3% saline and pt admitted to ICU. Na+ improved to 127 from 119 over about 24 hrs. TSH and cortisol were wnl. Then yest Na+ dropped to 125 and today 122.   Home meds: Norvasc, benazepril, advil, toprol xl, diflucan, ketoconazole, menthol topical   IP meds of interest: Midodrine 5 mg tid started here for hypotension   Serum osm 252 ( on admit) Cortisol 19, TSH 0.472 UN 69, UOsm 318   Assessment/ Plan: Hyponatremia: looks euvolemic on exam. Urine sodium was 69, UOsm 318, suggesting SIADH picture. TSH and cortisol wnl, no signs of cirrhosis/ CHF/ renal failure. Responded to 3% saline earlier here. Don't see any medications that would cause this. Unclear cause. Cont to hold acei for another 2-3 weeks. Na+ was up to 125-127 this am, then down to 124 at 10am. RN states she has taken all her salt tabs (crushed in applesauce). Continue NaCl 2 gm tid and fluid restriction (1000-1200 cc/d); I don't believe she's drinking too much. Only drinking whatever is given to her.  If slowly titrate down on NaCl pending response but not today  May benefit from tolvaptan (she appears to be euvolemic).   Dementia AMS HTN: as above UTI: on rocephin H/o partial seizures: not on medication H/o CVA  Subjective:  UOP yest 300  cc -> may be some leakage from the purewick Taking all her pills crushed in applesauce, including salt tablets RN says she is taking the salt pills   Vitals:   08/02/24 0400 08/02/24 0435 08/02/24 0500 08/02/24 0736  BP: (!) 92/56 116/77  (!) 96/59  Pulse: 70 78 64   Resp: (!)  23 (!) 22 (!) 22   Temp: 98.5 F (36.9 C)   97.6 F (36.4 C)  TempSrc: Oral   Oral  SpO2: 95% 97% 95%   Weight:   55 kg   Height:        Exam: Gen alert, no distress No jvd or bruits Chest clear bilat to bases RRR no MRG Abd soft ntnd no mass or ascites +bs Ext no LE edema, no other edema Neuro is alert, nf         Recent Labs  Lab 07/28/24 1528 07/28/24 1533 08/01/24 0338 08/02/24 0317  HGB 11.2*   < > 9.1* 8.8*  ALBUMIN 3.0*  --   --   --   CALCIUM 8.4*   < > 7.9* 7.7*  PHOS  --    < > 3.1 4.1  CREATININE 0.86   < > 0.51 0.63  K 4.8   < > 3.9 4.0   < > = values in this interval not displayed.   No results for input(s): IRON, TIBC, FERRITIN in the last 168 hours. Inpatient medications:  apixaban  5 mg Oral BID   Chlorhexidine Gluconate Cloth  6 each Topical Daily   cyanocobalamin   1,000 mcg Subcutaneous Daily   feeding supplement  237 mL Oral BID BM   fluconazole  150 mg Oral Daily   metoprolol tartrate  12.5 mg Oral BID  midodrine  10 mg Oral TID WC   pantoprazole  40 mg Oral Daily   sodium chloride  2 g Oral TID WC    acetaminophen , docusate sodium, metoprolol tartrate, mouth rinse, polyethylene glycol

## 2024-08-02 NOTE — Consult Note (Signed)
 WOC Nurse Consult Note: Reason for Consult: worsening sacral wound, wound POA Wound type: evolving DTPI (Deep Tissue Pressure Injury) Pressure Injury POA: Yes Measurement:3 areas of partial thickness skin loss; right buttock 2.0cm x 4.0cm and left buttock 2 smaller areas 2cm x 2cm x 0.1cm  Wound azi:ijmx purple tissue that measures 13cm x 14cm; does not blanch. Present on admission.  The open areas are 100% pink tissue, clean and moist  Drainage (amount, consistency, odor) serosanguinous  Periwound: intact Dressing procedure/placement/frequency: Cleanse area with saline, pat dry Apply single layer of xeroform gauze, top with foam Change daily.  Low air loss mattress added for pressure redistribution and moisture management Chair pressure redistribution pad for use when up in the chair.   Discussed POC with patients family and bedside nurse.  Re consult if needed, will not follow at this time. Thanks  Makiah [employer]e [employer], RN,CWOCN, CNS, [employer] [phone]

## 2024-08-02 NOTE — Telephone Encounter (Signed)
 Pharmacy Patient Advocate Encounter  Insurance verification completed.    The patient is insured through [facility]. Patient has Medicare and is not eligible for a copay card, but may be able to apply for patient assistance or Medicare RX Payment Plan (Patient Must reach out to their plan, if eligible for payment plan), if available.    Ran test claim for Eliquis 5mg  and the current 30 day co-pay is $45.00   This test claim was processed through [employer]- copay amounts may vary at other pharmacies due to [employer], or as the patient moves through the different stages of their insurance plan.

## 2024-08-02 NOTE — Plan of Care (Signed)
 Problem: Education: Goal: Knowledge of General Education information will improve Description: Including pain rating scale, medication(s)/side effects and non-pharmacologic comfort measures 08/02/2024 0535 by Elnor Zachary CROME, RN Outcome: Progressing 08/02/2024 0534 by Elnor Zachary CROME, RN Outcome: Progressing   Problem: Health Behavior/Discharge Planning: Goal: Ability to manage health-related needs will improve 08/02/2024 0535 by Elnor Zachary CROME, RN Outcome: Progressing 08/02/2024 0534 by Elnor Zachary CROME, RN Outcome: Progressing   Problem: Clinical Measurements: Goal: Ability to maintain clinical measurements within normal limits will improve 08/02/2024 0535 by Elnor Zachary CROME, RN Outcome: Progressing 08/02/2024 0534 by Elnor Zachary CROME, RN Outcome: Progressing Goal: Will remain free from infection 08/02/2024 0535 by Elnor Zachary CROME, RN Outcome: Progressing 08/02/2024 0534 by Elnor Zachary CROME, RN Outcome: Progressing Goal: Diagnostic test results will improve 08/02/2024 0535 by Elnor Zachary CROME, RN Outcome: Progressing 08/02/2024 0534 by Elnor Zachary CROME, RN Outcome: Progressing Goal: Respiratory complications will improve 08/02/2024 0535 by Elnor Zachary CROME, RN Outcome: Progressing 08/02/2024 0534 by Elnor Zachary CROME, RN Outcome: Progressing Goal: Cardiovascular complication will be avoided 08/02/2024 0535 by Elnor Zachary CROME, RN Outcome: Progressing 08/02/2024 0534 by Elnor Zachary CROME, RN Outcome: Progressing   Problem: Activity: Goal: Risk for activity intolerance will decrease 08/02/2024 0535 by Elnor Zachary CROME, RN Outcome: Progressing 08/02/2024 0534 by Elnor Zachary CROME, RN Outcome: Progressing   Problem: Nutrition: Goal: Adequate nutrition will be maintained 08/02/2024 0535 by Elnor Zachary CROME, RN Outcome: Progressing 08/02/2024 0534 by Elnor Zachary CROME, RN Outcome: Progressing   Problem: Coping: Goal: Level of anxiety will decrease 08/02/2024 0535 by Elnor Zachary CROME, RN Outcome:  Progressing 08/02/2024 0534 by Elnor Zachary CROME, RN Outcome: Progressing   Problem: Elimination: Goal: Will not experience complications related to bowel motility 08/02/2024 0535 by Elnor Zachary CROME, RN Outcome: Progressing 08/02/2024 0534 by Elnor Zachary CROME, RN Outcome: Progressing Goal: Will not experience complications related to urinary retention 08/02/2024 0535 by Elnor Zachary CROME, RN Outcome: Progressing 08/02/2024 0534 by Elnor Zachary CROME, RN Outcome: Progressing   Problem: Pain Managment: Goal: General experience of comfort will improve and/or be controlled 08/02/2024 0535 by Elnor Zachary CROME, RN Outcome: Progressing 08/02/2024 0534 by Elnor Zachary CROME, RN Outcome: Progressing   Problem: Safety: Goal: Ability to remain free from injury will improve 08/02/2024 0535 by Elnor Zachary CROME, RN Outcome: Progressing 08/02/2024 0534 by Elnor Zachary CROME, RN Outcome: Progressing   Problem: Skin Integrity: Goal: Risk for impaired skin integrity will decrease 08/02/2024 0535 by Elnor Zachary CROME, RN Outcome: Progressing 08/02/2024 0534 by Elnor Zachary CROME, RN Outcome: Progressing   Problem: Education: Goal: Knowledge of disease or condition will improve 08/02/2024 0535 by Elnor Zachary CROME, RN Outcome: Progressing 08/02/2024 0534 by Elnor Zachary CROME, RN Outcome: Progressing Goal: Knowledge of secondary prevention will improve (MUST DOCUMENT ALL) 08/02/2024 0535 by Elnor Zachary CROME, RN Outcome: Progressing 08/02/2024 0534 by Elnor Zachary CROME, RN Outcome: Progressing Goal: Knowledge of patient specific risk factors will improve (DELETE if not current risk factor) 08/02/2024 0535 by Elnor Zachary CROME, RN Outcome: Progressing 08/02/2024 0534 by Elnor Zachary CROME, RN Outcome: Progressing   Problem: Ischemic Stroke/TIA Tissue Perfusion: Goal: Complications of ischemic stroke/TIA will be minimized 08/02/2024 0535 by Elnor Zachary CROME, RN Outcome: Progressing 08/02/2024 0534 by Elnor Zachary CROME, RN Outcome: Progressing    Problem: Coping: Goal: Will verbalize positive feelings about self 08/02/2024 0535 by Elnor Zachary CROME, RN Outcome: Progressing 08/02/2024 0534 by Elnor Zachary CROME, RN Outcome: Progressing Goal: Will identify appropriate support needs 08/02/2024  [id number] by Elnor Zachary CROME, RN Outcome: Progressing 08/02/2024 0534 by Elnor Zachary CROME, RN Outcome: Progressing   Problem: Health Behavior/Discharge Planning: Goal: Ability to manage health-related needs will improve 08/02/2024 0535 by Elnor Zachary CROME, RN Outcome: Progressing 08/02/2024 0534 by Elnor Zachary CROME, RN Outcome: Progressing Goal: Goals will be collaboratively established with patient/family 08/02/2024 0535 by Elnor Zachary CROME, RN Outcome: Progressing 08/02/2024 0534 by Elnor Zachary CROME, RN Outcome: Progressing   Problem: Self-Care: Goal: Ability to participate in self-care as condition permits will improve 08/02/2024 0535 by Elnor Zachary CROME, RN Outcome: Progressing 08/02/2024 0534 by Elnor Zachary CROME, RN Outcome: Progressing Goal: Verbalization of feelings and concerns over difficulty with self-care will improve 08/02/2024 0535 by Elnor Zachary CROME, RN Outcome: Progressing 08/02/2024 0534 by Elnor Zachary CROME, RN Outcome: Progressing Goal: Ability to communicate needs accurately will improve 08/02/2024 0535 by Elnor Zachary CROME, RN Outcome: Progressing 08/02/2024 0534 by Elnor Zachary CROME, RN Outcome: Progressing   Problem: Nutrition: Goal: Risk of aspiration will decrease 08/02/2024 0535 by Elnor Zachary CROME, RN Outcome: Progressing 08/02/2024 0534 by Elnor Zachary CROME, RN Outcome: Progressing Goal: Dietary intake will improve 08/02/2024 0535 by Elnor Zachary CROME, RN Outcome: Progressing 08/02/2024 0534 by Elnor Zachary CROME, RN Outcome: Progressing

## 2024-08-02 NOTE — Progress Notes (Signed)
 STROKE TEAM PROGRESS NOTE   INTERIM HISTORY/SUBJECTIVE Her daughter and multiple friends are at the bedside.  Patient sitting in the chair in no apparent distress.  She remains disoriented and confused, can follow some simple commands.  Echocardiogram shows ejection fraction 40 to 45%.  Left atrial size is normal.    CBC    Component Value Date/Time   WBC 9.2 08/02/2024 0317   RBC 2.83 (L) 08/02/2024 0317   HGB 8.8 (L) 08/02/2024 0317   HCT 25.4 (L) 08/02/2024 0317   PLT 598 (H) 08/02/2024 0317   MCV 89.8 08/02/2024 0317   MCH 31.1 08/02/2024 0317   MCHC 34.6 08/02/2024 0317   RDW 17.2 (H) 08/02/2024 0317   LYMPHSABS 1.7 07/28/2024 1528   MONOABS 1.3 (H) 07/28/2024 1528   EOSABS 0.1 07/28/2024 1528   BASOSABS 0.1 07/28/2024 1528    BMET    Component Value Date/Time   NA 128 (L) 08/02/2024 0317   K 4.0 08/02/2024 0317   CL 96 (L) 08/02/2024 0317   CO2 22 08/02/2024 0317   GLUCOSE 78 08/02/2024 0317   BUN 10 08/02/2024 0317   CREATININE 0.63 08/02/2024 0317   CALCIUM 7.7 (L) 08/02/2024 0317   GFRNONAA >60 08/02/2024 0317    IMAGING past 24 hours CT Angio Chest Pulmonary Embolism (PE) W or WO Contrast Result Date: 08/02/2024 EXAM: CTA of the Chest with contrast for PE 08/02/2024 12:10:16 AM TECHNIQUE: CTA of the chest was performed after the administration of intravenous contrast. Multiplanar reformatted images are provided for review. MIP images are provided for review. Automated exposure control, iterative reconstruction, and/or weight based adjustment of the mA/kV was utilized to reduce the radiation dose to as low as reasonably achievable. COMPARISON: AP and lateral chest 11/23/2022, and same day CT angiography (CTA) of the head and neck images. CLINICAL HISTORY: FINDINGS: PULMONARY ARTERIES: Pulmonary arteries are adequately opacified for evaluation. There is a subsegmental branch point arterial embolus in the right upper lobe on series 6 axial images 40 through 42  corresponding to the finding noted on the CTA head and neck. No further arterial emboli are identified. The pulmonary arteries and veins are normal caliber. Main pulmonary artery is normal in caliber. MEDIASTINUM: Mild cardiomegaly with left chamber predominance and no evidence of acute right heart strain. There is a small amount of scattered single vessel calcification in the proximal LAD. Mild atherosclerosis and tortuosity of the aorta and great vessels without aneurysm, stenosis, or dissection. There is a 2-vessel aortic arch with a normal variant brachiocephalic trunk. LYMPH NODES: No mediastinal, hilar or axillary lymphadenopathy. LUNGS AND PLEURA: There is a posterior right diaphragmatic fat herniation into the lower chest. There is mild scattered subpleural reticulation in the upper lobes, mild posterior passive atelectasis in both lungs. There is no consolidation, effusion, pneumothorax, or suspicious lung nodule. UPPER ABDOMEN: There are Bosniak 1 cysts in the upper pole of the kidneys, with a 3 cm cyst measuring 18 Hounsfield units on series 6 axial 127 left upper pole, additional cysts in the right upper pole largest is 2 cm and 20 Hounsfield units. No follow-up imaging is recommended. No other significant upper abdominal findings. SOFT TISSUES AND BONES: There is osteopenia and advanced degenerative change of the thoracic spine. There are mild chronic wedge compression deformities of the T12 and L1 vertebral bodies, also seen on a lumbar spine CT from 09/22/2023. Slight thoracic kyphodextroscoliosis. No acute soft tissue abnormality. IMPRESSION: 1. Subsegmental pulmonary embolus in the right upper lobe, with  no additional pulmonary emboli identified. 2. Mild cardiomegaly without evidence of acute right heart strain. 3. Mild chronic changes in the upper lobes . No acute chest CT or CTA findings . 4. Advanced degenerative changes of the thoracic spine. Electronically signed by: Francis Quam MD 08/02/2024  12:54 AM EST RP Workstation: [id number]   CT ANGIO HEAD NECK W WO CM Result Date: 08/01/2024 CLINICAL DATA:  Follow-up examination for stroke. EXAM: CT ANGIOGRAPHY HEAD AND NECK WITH AND WITHOUT CONTRAST TECHNIQUE: Multidetector CT imaging of the head and neck was performed using the standard protocol during bolus administration of intravenous contrast. Multiplanar CT image reconstructions and MIPs were obtained to evaluate the vascular anatomy. Carotid stenosis measurements (when applicable) are obtained utilizing NASCET criteria, using the distal internal carotid diameter as the denominator. RADIATION DOSE REDUCTION: This exam was performed according to the departmental dose-optimization program which includes automated exposure control, adjustment of the mA and/or kV according to patient size and/or use of iterative reconstruction technique. CONTRAST:  75mL OMNIPAQUE IOHEXOL 350 MG/ML SOLN COMPARISON:  Comparison made with MRI from 07/31/2024 as well as earlier studies. FINDINGS: CT HEAD FINDINGS Brain: Atrophy with advanced chronic microvascular ischemic disease again noted. Scattered areas of chronic cortical mineralization involving the right greater than left parieto-occipital regions, with additional involvement of the perirolandic areas and mesial temporal lobes, stable from prior. Previously noted superimposed ischemic changes not well visualized by CT. No other acute large vessel territory infarct. No acute intracranial hemorrhage. No mass lesion or midline shift. No hydrocephalus or extra-axial fluid collection. Vascular: No abnormal hyperdense vessel. Calcified atherosclerosis present at the skull base. Skull: Scalp soft tissues and calvarium demonstrate no new finding. Sinuses/Orbits: Globes orbital soft tissues within normal limits. Right maxillary sinus retention cyst noted. Minimal layering secretions noted within the left sphenoid sinus. No mastoid effusion. Other: None. Review of the MIP images  confirms the above findings CTA NECK FINDINGS Aortic arch: Visualized aortic arch within normal limits for caliber. Bovine branching pattern noted. A atheromatous change present about the aortic arch. No high-grade stenosis about the origin the great vessels. Right carotid system: Right common and internal carotid arteries are tortuous. No dissection. Bulky calcified plaque about the right carotid bulb with associated stenosis of up to 55% by NASCET criteria. Left carotid system: Left common and internal carotid arteries are tortuous. No dissection. Moderate calcified plaque about the left carotid bulb without hemodynamically significant greater than 50% stenosis. Vertebral arteries: Both vertebral arteries arise from subclavian arteries. Vertebral arteries are tortuous bilaterally. No stenosis or dissection. Skeleton: No worrisome osseous lesions. Advanced multilevel cervical spondylosis without high-grade spinal stenosis. Other neck: No other acute finding. Upper chest: There is a questionable filling defect within a pulmonary arterial vessel at the right upper lobe (series 10, image 297). While this could be related to timing the contrast bolus, a possible small acute pulmonary embolism is difficult to exclude. No other acute finding. Review of the MIP images confirms the above findings CTA HEAD FINDINGS Anterior circulation: Vermis atheromatous change about the carotid siphons without hemodynamically significant stenosis. A1 segments patent bilaterally. Normal anterior communicating artery complex. Anterior cerebral arteries patent without significant stenosis. No M1 stenosis or occlusion. No proximal MCA branch occlusion or high-grade stenosis. Distal MCA branches perfused and symmetric. Posterior circulation: Both V4 segments widely patent without stenosis. Left PICA patent at its origin. Right PICA not well seen. Basilar patent without stenosis. Superior cerebral arteries patent bilaterally. Left PCA supplied  via the basilar as well  as a prominent left posterior communicating artery. Predominant fetal type origin of the right PCA supplied via the right PCOM, although note is made of a probable occluded hypoplastic right P1 segment (series 10, image 102). Right PCA widely patent distally. Venous sinuses: Not well assessed due to arterial timing the contrast bolus. Anatomic variants: As above.  No aneurysm. Review of the MIP images confirms the above findings IMPRESSION: CT HEAD: 1. No acute intracranial abnormality. 2. Atrophy with advanced chronic microvascular ischemic disease. Scattered areas of chronic cortical mineralization involving the right greater than left parieto-occipital regions, with additional involvement of the perirolandic areas and mesial temporal lobes, stable from prior. Previously noted superimposed ischemic changes not well visualized by CT. CTA HEAD AND NECK: 1. Negative CTA for large vessel occlusion or other emergent finding. 2. Bulky calcified plaque about the right carotid bulb with associated stenosis of up to 55% by NASCET criteria. 3. Moderate calcified plaque about the left carotid bulb without hemodynamically significant greater than 50% stenosis. 4. Occluded right P1 segment. There is a robust and widely patent right posterior communicating artery, with wide patency of the right PCA distally. 5. Questionable filling defect within a pulmonary arterial vessel at the right upper lobe. While this could be related to timing the contrast bolus with mixing artifact, a possible small acute pulmonary embolism is difficult to exclude. Further evaluation with dedicated CTA of the chest recommended. 6.  Aortic Atherosclerosis (ICD10-I70.0). These results were communicated to Dr. Michaela at 9:40 pm on 08/01/2024 by text page via the [facility] messaging system. Electronically Signed   By: Morene Hoard M.D.   On: 08/01/2024 21:44    Vitals:   08/02/24 0435 08/02/24 0500 08/02/24 0736 08/02/24  1155  BP: 116/77  (!) 96/59 109/73  Pulse: 78 64  76  Resp: (!) 22 (!) 22  20  Temp:   97.6 F (36.4 C) 98.1 F (36.7 C)  TempSrc:   Oral Oral  SpO2: 97% 95%  100%  Weight:  55 kg    Height:         PHYSICAL EXAM General:  Alert, well-nourished, well-developed patient in no acute distress Psych:  Mood and affect appropriate for situation CV: Regular rate and rhythm on monitor Respiratory:  Regular, unlabored respirations on room air GI: Abdomen soft and nontender   NEURO:  Mental Status: Confused mild dysarthria  Cranial Nerves:  II: PERRL. Visual fields no blink to threat bilaterally, does not track, cortical blindness with some visual hallucinations and confabulation III, IV, VI: EOMI.  V: Sensation is intact to light touch and symmetrical to face.  VII: Face is symmetrical resting and smiling VIII: hearing intact to voice. IX, X: Palate elevates symmetrically. Phonation is normal.  KP:Dynloizm shrug 5/5. XII: tongue is midline without fasciculations. Motor: 5/5 strength in right upper, left arm with a drift, bilateral lowers poor effort with mild weakness but withdraws to pain does not move against gravity Tone: is normal and bulk is normal Sensation- response to noxious stimuli Coordination: Unable to assess Gait- deferred   ASSESSMENT/PLAN  Ms. Danielle Vaughan is a 89 y.o. female with history of  hypertension, previous stroke who presents with altered mental status has been going on for a week or two.  She had a fall, and following this she spent a couple of days in bed.  She was noted to be more confused.  She presented to the emergency department with confusion, and was noted that she was hyponatremic at  119 and she was admitted to the ICU for hyponatremia.  An MRI was obtained on 10/29, however is markedly limited and there was some suggestion of ischemia, but this was not definite by the initial scan and therefore this was repeated on 11/1 which revealed  multifocal posterior circulation predominant infarcts.  NIH on Admission 22  Acute Ischemic Infarct:  Patchy subacute bilateral posterior circulation infarcts, possibly one in the posterior watershed area etiology likely cardioembolic with cortical blindness and denial of blindness.  Stroke etiology cardiogenic embolism from atrial flutter CTA head & neck occluded right P1 segment.  55% calcific plaque with right carotid and 50% left carotid stenosis MRI   Resolving diffusion abnormalities within the posterior hemispheres with no new ischemic lesions. 2. Early confluent T2 hyperintense signal within the cerebral white matter, most consistent with chronic small vessel disease 2D Echo EF 40 to 45%.  Mild LVH with grade 1 diastolic dysfunction.  Moderate TV regurgitation LDL 21 HgbA1c 5.3 VTE prophylaxis -heparin subcu No antithrombotic prior to admission, now on Eliquis Therapy recommendations:  Pending Disposition: Pending  Hx of Stroke/TIA Right occipital stroke  Cardiac arrhythmias history of nonsustained V. Tach; Paroxysmal SVT A flutter on monitor Follows with cardiology Need to consider DOAC  Hypertension Home meds: Amlodipine 5 mg, Lotensin 40 mg, Stable Blood Pressure Goal: SBP less than 160   Hyperlipidemia Home meds: None LDL 21, goal < 70 High intensity statin not indicated due to well below goal Continue statin at discharge  Dysphagia Patient has post-stroke dysphagia, SLP consulted    Diet   DIET DYS 3 Room service appropriate? No; Fluid consistency: Thin; Fluid restriction: 1200 mL Fluid   Advance diet as tolerated  Other Stroke Risk Factors Congestive heart failure   Other Active Problems Dementia UTI Hyponatremia  Hospital day # 5     Patient presented with confusion and MRI showing subacute bilateral   posterior circulation   embolic infarcts.   Cardiac monitoring showed atrial flutter these are likely cardioembolic.  Patient's clinical presentation  suggest cortical blindness with some denial of blindness.   Long discussion of the bedside with the patient's daughter about risk-benefit and alternatives of anticoagulation for stroke prevention given A-flutter and multiple stroke.  Family agreeable to starting Eliquis .  Continue physical occupational therapies.  Patient will likely need rehab in the skilled nursing setting.  Discussed with Dr Lanelle.  Stroke team will sign off.  Kindly call for questions   I personally spent a total of 35 minutes in the care of the patient today including getting/reviewing separately obtained history, performing a medically appropriate exam/evaluation, counseling and educating, placing orders, referring and communicating with other health care professionals, documenting clinical information in the EHR, independently interpreting results, and coordinating care.         Eather Popp, MD Medical Director [facility] Pager: [phone] 08/02/2024 3:29 PM   To contact Stroke Continuity provider, please refer to [email or web address]. After hours, contact General Neurology

## 2024-08-02 NOTE — TOC Progression Note (Signed)
 Transition of Care [facility]) - Progression Note    Patient Details  Name: Danielle Vaughan MRN: [id number] Date of Birth: [birth year]  Transition of [facility]) CM/SW Contact  Inocente GORMAN Kindle, LCSW Phone Number: 08/02/2024, 4:11 PM  Clinical Narrative:    CSW received consult for possible SNF placement at time of discharge. CSW spoke with patient's daughter. Patient's duaghter reported that patient's family is currently unable to care for patient at their home given patient's current physical needs and fall risk. Patient's daughter expressed understanding of PT recommendation and is agreeable to SNF placement at time of discharge. CSW discussed insurance authorization process and will provide Medicare SNF ratings list. CSW will send out referrals for review and provide bed offers as available.    Skilled Nursing Rehab Facilities-   [email or web address]   Ratings out of 5 stars (5 the highest)  Name Address  Phone # Quality Care Staffing Health Inspection Overall  [facility] [address] [phone] 2 1 2 1   [facility] [address] [place] [phone] 5 2 4 5   [facility] [address], [place] [phone] 2 1 1 1   [facility] [address], [place] [phone] 4 3 4 4   [facility]  [address], [place] [phone] 4 3 5 5   [facility] [address] [facility] [phone] 5 3 2 3   [facility] [address] [place] [phone] 5 1 2 2   [facility] [phone] [address], [place] [phone] 3 4 4 4   [address] ([place]) [address], [place] [phone] 1 3 3 2   [facility] [address] [place], [place] [phone] 4 2 2 2   [facility] ([place]) [address], [place] [phone] 2 1 1 1   Clotilda Pereyra [address] [place] [phone] 2 4 4 4   [facility] [address] [place]  [phone] 3 1 3 2   [facility] ([place]) [address], [place] [phone] 1 2 4 3           [facility] [address] [place] [phone] 3 1 5 4   [facility] [address] [place] [phone] 4 2 1 1   [facility]  [address], [place] [phone] 2 4 1 1   [facility] [address] [phone] 2 2 5 5   [facility] [address], [place] [phone] 2 2 3 3           [facility] [address], [place] [phone] 2 1 2 1   [facility] (No [facility]) [address], [place] [phone] 4 3 4 4   [facility] [address] [place] [phone] 3  5 5   [facility] [address], [place] [phone] 4 2 1 1   [facility] [address] [place] [phone] 3 1 2 1   [facility] [address] [place] [phone] 1 3 3 2   [facility] [address], [place] [phone] 2 2 3 3   [facility] [address] [place], [place] [phone] 2 1 4 3   [facility] [address], [facility] [phone] 2 1 1 1   [facility] [address] [place], [place] [phone] 2 1 2 1   [facility] [address] [place] [phone] 3 1 2 1           [facility] [address], [place] [phone] 4 1 3 2   [place]  [address], [place]  [phone] 2 4 4 4   [facility] (No [employer]) [address] [place], [place] [phone] 3 2 5 5   [facility]) [address], [place] [phone] 3 2 3 3   [facility] [address], [place] [phone] 5 3 5 5   [facility] [address], [place] [phone] 2 1 1 1   [facility] [address] [place], [place] [phone] 3 5 5 5           [facility] [address] [place], [place] [phone] 5 4 5 5   [facility])  [address] [place] [phone] 1 1 2 1   [facility]) [address] [place] [phone]  2 4 4   [facility] [address] [place] [phone] 3 5 5 5   [address] [place], [place] [phone] 4 2 2 2   [facility]) [address] [place] [phone] 1 1 3 1   [facility] [address] [place] [phone] 2 2 2 2       Expected Discharge Plan: Skilled Nursing Facility Barriers to Discharge: Continued Medical Work up, SNF Pending bed offer               Expected Discharge Plan and Services In-house Referral: Clinical Social Work   Post Acute Care Choice: Skilled Nursing Facility Living arrangements for the past 2 months: Single Family Home                                       Social Drivers of Health (SDOH) Interventions SDOH Screenings   Food Insecurity: No Food Insecurity (07/29/2024)  Housing: Low Risk  (07/29/2024)  Recent Concern: Housing - High Risk (07/29/2024)  Transportation Needs: No Transportation Needs (07/29/2024)  Utilities: Not At Risk (07/29/2024)  Alcohol Screen: Low Risk  (07/29/2024)  Depression (PHQ2-9): Low Risk  (07/29/2024)  Financial Resource Strain: Low Risk  (07/29/2024)  Physical Activity: Inactive (07/29/2024)  Social Connections: Moderately Integrated (07/29/2024)  Stress: No Stress Concern Present (07/29/2024)  Tobacco Use: Low Risk  (07/29/2024)  Health Literacy: Inadequate Health Literacy (07/29/2024)    Readmission Risk Interventions     No data to display

## 2024-08-02 NOTE — [place] FL2 (Signed)
 [place]  MEDICAID FL2 LEVEL OF CARE FORM     IDENTIFICATION  Patient Name: Danielle Vaughan Birthdate: 02-28-1933 Sex: female Admission Date (Current Location): 07/28/2024  [facility] and [place] Number:  Producer, Television/film/video and Address:  [facility], [address], [place], [place] [address]      Provider Number: [id number]  Attending Physician Name and Address:  Elgergawy, Brayton RAMAN, MD  Relative Name and Phone Number:       Current Level of Care: Hospital Recommended Level of Care: Skilled Nursing Facility Prior Approval Number:    Date Approved/Denied:   PASRR Number: [id number]  Discharge Plan: SNF    Current Diagnoses: Patient Active Problem List   Diagnosis Date Noted   Acute hyponatremia 07/28/2024   Dementia (HCC) 03/06/2023   Partial seizures (HCC) 11/29/2021   Abnormal glucose 11/29/2021   Abnormal findings on diagnostic imaging of other specified body structures 11/29/2021   Memory loss 11/29/2021   NSVT (nonsustained ventricular tachycardia) (HCC) 06/14/2020   Paroxysmal SVT (supraventricular tachycardia) 06/14/2020   Frequent PVCs 06/14/2020   History of CVA (cerebrovascular accident) 01/27/2020   Occipital stroke (HCC) 12/08/2019   Visual changes 12/08/2019   Right hand weakness 11/15/2019   Abnormal CT of the head 11/15/2019    Orientation RESPIRATION BLADDER Height & Weight     Self  Normal Incontinent, External catheter Weight: 121 lb 4.1 oz (55 kg) Height:  5' 1 (154.9 cm)  BEHAVIORAL SYMPTOMS/MOOD NEUROLOGICAL BOWEL NUTRITION STATUS      Incontinent Diet (See dc summary)  AMBULATORY STATUS COMMUNICATION OF NEEDS Skin   Extensive Assist Verbally PU Stage and Appropriate Care (Stage I on sacrum)                       Personal Care Assistance Level of Assistance  Bathing, Feeding, Dressing Bathing Assistance: Maximum assistance Feeding assistance: Maximum assistance Dressing Assistance: Maximum  assistance     Functional Limitations Info  Sight Sight Info: Impaired        SPECIAL CARE FACTORS FREQUENCY  PT (By licensed PT), OT (By licensed OT)     PT Frequency: 5x/week OT Frequency: 5x/week            Contractures Contractures Info: Not present    Additional Factors Info  Code Status, Allergies Code Status Info: Full Allergies Info: Cefoxitin, Ceftin (Cefuroxime Axetil), Codeine           Current Medications (08/02/2024):  This is the current hospital active medication list Current Facility-Administered Medications  Medication Dose Route Frequency Provider Last Rate Last Admin   acetaminophen  (TYLENOL ) tablet 650 mg  650 mg Oral Q6H PRN Howerter, Justin B, DO   650 mg at 08/02/24 0652   apixaban (ELIQUIS) tablet 5 mg  5 mg Oral BID Elgergawy, Dawood S, MD   5 mg at 08/02/24 0919   Chlorhexidine Gluconate Cloth 2 % PADS 6 each  6 each Topical Daily Daren Ronnald BRAVO, NP   6 each at 08/02/24 0912   cyanocobalamin  (VITAMIN B12) injection 1,000 mcg  1,000 mcg Subcutaneous Daily Elgergawy, Dawood S, MD   1,000 mcg at 08/02/24 0920   docusate sodium (COLACE) capsule 100 mg  100 mg Oral BID PRN Bowser, Grace E, NP       feeding supplement (ENSURE PLUS HIGH PROTEIN) liquid 237 mL  237 mL Oral BID BM Alva, Rakesh V, MD   237 mL at 08/02/24 1315   metoprolol tartrate (  LOPRESSOR) injection 5 mg  5 mg Intravenous Q4H PRN Howerter, Justin B, DO       metoprolol tartrate (LOPRESSOR) tablet 12.5 mg  12.5 mg Oral BID Elgergawy, Dawood S, MD   12.5 mg at 08/02/24 1315   midodrine (PROAMATINE) tablet 10 mg  10 mg Oral TID WC Elgergawy, Dawood S, MD   10 mg at 08/02/24 1312   multivitamin (PROSIGHT) tablet 1 tablet  1 tablet Oral BID Elgergawy, Dawood S, MD       Oral care mouth rinse  15 mL Mouth Rinse PRN Gretta Doffing P, DO       pantoprazole (PROTONIX) EC tablet 40 mg  40 mg Oral Daily Elgergawy, Dawood S, MD   40 mg at 08/02/24 1312   polyethylene glycol (MIRALAX / GLYCOLAX)  packet 17 g  17 g Oral Daily PRN Bowser, Grace E, NP       sodium chloride tablet 2 g  2 g Oral TID WC Elgergawy, Dawood S, MD   2 g at 08/02/24 1312     Discharge Medications: Please see discharge summary for a list of discharge medications.  Relevant Imaging Results:  Relevant Lab Results:   Additional Information SSN    [id number]  Inocente GORMAN Kindle, LCSW

## 2024-08-03 DIAGNOSIS — Z7189 Other specified counseling: Secondary | ICD-10-CM | POA: Diagnosis not present

## 2024-08-03 DIAGNOSIS — Z515 Encounter for palliative care: Secondary | ICD-10-CM | POA: Diagnosis not present

## 2024-08-03 DIAGNOSIS — E871 Hypo-osmolality and hyponatremia: Secondary | ICD-10-CM | POA: Diagnosis not present

## 2024-08-03 DIAGNOSIS — I2699 Other pulmonary embolism without acute cor pulmonale: Secondary | ICD-10-CM | POA: Diagnosis not present

## 2024-08-03 LAB — CBC
HCT: 26.7 % — ABNORMAL LOW (ref 36.0–46.0)
Hemoglobin: 9.2 g/dL — ABNORMAL LOW (ref 12.0–15.0)
MCH: 31.2 pg (ref 26.0–34.0)
MCHC: 34.5 g/dL (ref 30.0–36.0)
MCV: 90.5 fL (ref 80.0–100.0)
Platelets: 672 K/uL — ABNORMAL HIGH (ref 150–400)
RBC: 2.95 MIL/uL — ABNORMAL LOW (ref 3.87–5.11)
RDW: 17.1 % — ABNORMAL HIGH (ref 11.5–15.5)
WBC: 8.1 K/uL (ref 4.0–10.5)
nRBC: 0 % (ref 0.0–0.2)

## 2024-08-03 LAB — MAGNESIUM: Magnesium: 1.9 mg/dL (ref 1.7–2.4)

## 2024-08-03 LAB — BASIC METABOLIC PANEL WITH GFR
Anion gap: 10 (ref 5–15)
BUN: 9 mg/dL (ref 8–23)
CO2: 23 mmol/L (ref 22–32)
Calcium: 8 mg/dL — ABNORMAL LOW (ref 8.9–10.3)
Chloride: 99 mmol/L (ref 98–111)
Creatinine, Ser: 0.61 mg/dL (ref 0.44–1.00)
GFR, Estimated: >60 mL/min (ref >60)
Glucose, Bld: 82 mg/dL (ref 70–99)
Potassium: 3.8 mmol/L (ref 3.5–5.1)
Sodium: 132 mmol/L — ABNORMAL LOW (ref 135–145)

## 2024-08-03 LAB — PHOSPHORUS: Phosphorus: 4 mg/dL (ref 2.5–4.6)

## 2024-08-03 MED ORDER — SODIUM CHLORIDE 1 G PO TABS
1.0000 g | ORAL_TABLET | Freq: Two times a day (BID) | ORAL | Status: DC
  Administered 2024-08-03 – 2024-08-05 (×5): 1 g via ORAL
  Filled 2024-08-03 (×6): qty 1

## 2024-08-03 NOTE — TOC Progression Note (Signed)
 Transition of Care [facility]) - Progression Note    Patient Details  Name: Danielle Vaughan MRN: [id number] Date of Birth: 1933/05/11  [facility] CM/SW Contact  Almarie CHRISTELLA Goodie, [place] Phone Number: 08/03/2024, 3:42 PM  Clinical Narrative:   CSW spoke with daughter, Gaetana, to provide bed offers for SNF and answer questions. Lengthy discussion with daughter about options available, insurance coverage, and expectations for the near future. Daughter appreciative of CSW discussion. Daughter wants to tour [place] and [place], will go to [place] this afternoon and ask her brother to go to [place] so that they can update with a SNF choice. CSW indicated that it's important for the patient's recovery to get her into rehab as soon as we can, and daughter indicated understanding. CSW to follow.    Expected Discharge Plan: Skilled Nursing Facility Barriers to Discharge: Continued Medical Work up, SNF Pending bed offer               Expected Discharge Plan and Services In-house Referral: Clinical Social Work   Post Acute Care Choice: Skilled Nursing Facility Living arrangements for the past 2 months: Single Family Home                                       Social Drivers of Health (SDOH) Interventions SDOH Screenings   Food Insecurity: No Food Insecurity (07/29/2024)  Housing: Low Risk  (07/29/2024)  Recent Concern: Housing - High Risk (07/29/2024)  Transportation Needs: No Transportation Needs (07/29/2024)  Utilities: Not At Risk (07/29/2024)  Alcohol Screen: Low Risk  (07/29/2024)  Depression (PHQ2-9): Low Risk  (07/29/2024)  Financial Resource Strain: Low Risk  (07/29/2024)  Physical Activity: Inactive (07/29/2024)  Social Connections: Moderately Integrated (07/29/2024)  Stress: No Stress Concern Present (07/29/2024)  Tobacco Use: Low Risk  (07/29/2024)  Health Literacy: Inadequate Health Literacy (07/29/2024)    Readmission Risk Interventions      No data to display

## 2024-08-03 NOTE — Progress Notes (Signed)
 Speech Language Pathology Treatment: Dysphagia  Patient Details Name: Danielle Vaughan MRN: [id number] DOB: 20-Jan-1933 Today's Date: 08/03/2024 Time: 8567-8553 SLP Time Calculation (min) (ACUTE ONLY): 14 min  Assessment / Plan / Recommendation Clinical Impression  Observed pt take in larger volumes of water today without s/s of aspiration. She was attentive to masticating mixed consistency solids and although mildly prolonged, she clears her oral cavity completely. Recommend she continue current bite-sized solid diet with thin liquids. Ongoing SLP f/u for education regarding the progression of dysphagia in pts with dementia and risk for inadequate nutrition/hydration may be beneficial going forward. Otherwise, no further acute f/u is needed for swallowing and her sister confirms that pt's cognition is consistent with her baseline so SLP will defer cognitive-linguistic evaluation.    HPI HPI: 89 yo female presenting to ED 10/29 with L sided weakness and increased confusion x1 week s/p recent fall and yeast infection. MRI severely limited by intolerance and motion artifact, though superimposed ischemic infarcts may be present throughout the posterior frontal, parietal, and occipital lobes. Repeat MRI 11/1 shows multifocal posterior circulation predominant infarcts. Family reports recent oral deficits characterized by chewing pills and blowing rather than sucking through a straw. PMH includes HTN, prior CVA, HLD, pre-diabetes, CKD II, partial seizures, dementia      SLP Plan  All goals met          Recommendations  Diet recommendations: Dysphagia 3 (mechanical soft);Thin liquid Liquids provided via: Cup;Straw Medication Administration: Whole meds with puree Supervision: Staff to assist with self feeding;Full supervision/cueing for compensatory strategies Compensations: Minimize environmental distractions;Slow rate;Small sips/bites Postural Changes and/or Swallow Maneuvers: Seated upright 90  degrees                  Oral care BID   PRN Dysphagia, unspecified (R13.10)     All goals met     Damien Blumenthal, M.A., CCC-SLP Speech Language Pathology, Acute Rehabilitation Services  Secure Chat preferred [phone]   08/03/2024, 3:13 PM

## 2024-08-03 NOTE — Plan of Care (Signed)
  Problem: Education: Goal: Knowledge of General Education information will improve Description: Including pain rating scale, medication(s)/side effects and non-pharmacologic comfort measures Outcome: Progressing   Problem: Clinical Measurements: Goal: Ability to maintain clinical measurements within normal limits will improve Outcome: Progressing Goal: Diagnostic test results will improve Outcome: Progressing   Problem: Activity: Goal: Risk for activity intolerance will decrease Outcome: Progressing   Problem: Nutrition: Goal: Adequate nutrition will be maintained Outcome: Progressing   Problem: Safety: Goal: Ability to remain free from injury will improve Outcome: Progressing   Problem: Education: Goal: Knowledge of disease or condition will improve 08/03/2024 0513 by Yvone Fairy CROME, RN Outcome: Progressing 08/03/2024 0044 by Yvone Fairy CROME, RN Outcome: Progressing Goal: Knowledge of secondary prevention will improve (MUST DOCUMENT ALL) 08/03/2024 0513 by Yvone Fairy CROME, RN Outcome: Progressing 08/03/2024 0044 by Yvone Fairy CROME, RN Outcome: Progressing Goal: Knowledge of patient specific risk factors will improve (DELETE if not current risk factor) 08/03/2024 0513 by Yvone Fairy CROME, RN Outcome: Progressing 08/03/2024 0044 by Yvone Fairy CROME, RN Outcome: Progressing   Problem: Ischemic Stroke/TIA Tissue Perfusion: Goal: Complications of ischemic stroke/TIA will be minimized 08/03/2024 0513 by Yvone Fairy CROME, RN Outcome: Progressing 08/03/2024 0044 by Yvone Fairy CROME, RN Outcome: Progressing   Problem: Coping: Goal: Will verbalize positive feelings about self Outcome: Progressing

## 2024-08-03 NOTE — Progress Notes (Signed)
 PROGRESS NOTE    Danielle Vaughan  FMW:[id number] DOB: 1933-07-11 DOA: 07/28/2024 PCP: [name], MD   Chief Complaint  Patient presents with   Weakness    Brief Narrative:   89 year old female with past medical history of stroke, hypertension, hyperlipidemia, pre-diabetes, CKD II, partial seizures, dementia with baseline orientation to self and place presents to ED with stroke like symptoms.    Reportedly, patient arriving from her doctor's office after she was noted to have stroke-like symptoms with left sided weakness and confusion above her baseline for about 1 week. She had a fall 9 days prior and has had weakness with ambulating since. Additionally, family reports she is being treated for yeast infection with fluconazole. - ED her workup significant for hyponatremia, UTI, she remains quite encephalopathic, initial MRI brain poor quality due to her moving, repeat MRI confirms acute CVA, as well workup significant for subsegmental PE.    Assessment & Plan:   Principal Problem:   Acute hyponatremia  Hypotonic hyponatremia - This is most likely due to to poor oral intake. - Managed initially in ICU, requiring hypertonic saline.  improved from 119-127 initially - nephrology input greatly appreciated, SIADH picture, so for now continue with salt tablets and fluid restrictions, improving.  It is 132 today. - TSH 0.472, cortisol 19.3  Acute metabolic encephalopathy with underlying significant baseline dementia -Multifactorial factorial, in the setting of UTI, hyponatremia.  With significant underlying dementia - MRI with significant motion degradation, repeat MRI confirms acute CVA - B12 is borderline, will start on supplements  Acute CVA - MRI obtained 11/1 confirms acute CVA. - Neurology input greatly appreciated. - Echo, CTA head and neck pending. - Appears of embolic origin, please see discussion below regarding SVT/A-flutter - Stroke team is following - With  hypotension, but she remains on beta-blockers to control her heart rate, so we will start on midodrine as it is important for her to continue her metoprolol - Patient with cortical blinding, unaware of I(Anton's syndrome)  Chronic systolic CHF - EF 40 to 45% - No evidence of volume overload, compensated - Continue with metoprolol mainly for heart rate control - No room in blood pressure for any other GDMT  Paroxysmal SVT  - Patient with known history of SVT, followed by cardiology as an outpatient. - She had recurrent SVTs, continue with low-dose metoprolol  A flutter - She had significant event of SVT from 4:49 AM till 5:15 AM 11/2, discussed with Dr. Inocencio who reviewed the strip, and that event related to a flutter. - Recommendation for outpatient A-fib clinic referral. - Initially contemplating anticoagulation risks versus benefits due to her blindness and fall, but now with new diagnosis of PE will start on anticoagulation.  (Can convert to 2.5 mg twice daily in 1 week)  Acute PE -CTA significant for subsegmental PE, will start on Eliquis   Hyperlipidemia  Hypertension  - Blood pressure on the lower side, so we will continue to hold home meds including amlodipine, metoprolol and benazepril   UTI - Treated with Rocephin   Hypotension - On midodrine, avoid hypotension setting of acute CVA  Prediabetes  - A1c 1c 5.3  Remote h/o partial seizures; not on AEDs Dementia  History of occipital stroke  -Tried Namenda but was dizzy so stopped in the past - Will stop aspirin now she is started on Eliquis - delirium precautions    Left hand swelling/pain -  x-ray is negative, nontender   DVT prophylaxis: Eliquis Code Status: (Full)  Family Communication: (Discussed with son at bedside)  Disposition: Patient is currently medically stable for discharge, awaiting SNF bed availability  Status is: Inpatient    Consultants:   Neurology Palliative Renal   Subjective:  Patient denies any complaints, son at bedside, he denies any significant events, she has a good appetite.  Objective: Vitals:   08/02/24 2000 08/02/24 2141 08/03/24 0500 08/03/24 0800  BP: 111/77 113/82  137/83  Pulse: 75 78  78  Resp: 15   (!) 21  Temp: 98.7 F (37.1 C)   98.2 F (36.8 C)  TempSrc: Axillary   Oral  SpO2: 99%   95%  Weight:   54.3 kg   Height:        Intake/Output Summary (Last 24 hours) at 08/03/2024 0917 Last data filed at 08/03/2024 0402 Gross per 24 hour  Intake --  Output 250 ml  Net -250 ml    Filed Weights   08/01/24 0400 08/02/24 0500 08/03/24 0500  Weight: 54.5 kg 55 kg 54.3 kg    Examination:   Awake Alert, pleasantly demented demented Good air entry Regular rate and rhythm Abdomen soft  extremities with no edema.     Data Reviewed: I have personally reviewed following labs and imaging studies  CBC: Recent Labs  Lab 07/28/24 1528 07/28/24 1533 07/29/24 1048 07/31/24 0224 08/01/24 0338 08/02/24 0317 08/03/24 0325  WBC 10.3  --  9.1 12.0* 9.7 9.2 8.1  NEUTROABS 7.1  --   --   --   --   --   --   HGB 11.2*   < > 12.3 9.6* 9.1* 8.8* 9.2*  HCT 31.6*   < > 35.8* 27.4* 26.3* 25.4* 26.7*  MCV 88.3  --  89.1 88.4 89.2 89.8 90.5  PLT 543*  --  542* 552* 528* 598* 672*   < > = values in this interval not displayed.    Basic Metabolic Panel: Recent Labs  Lab 07/29/24 0817 07/29/24 1711 07/30/24 0548 07/31/24 0224 07/31/24 1749 07/31/24 1959 08/01/24 0338 08/01/24 0954 08/01/24 1431 08/01/24 1731 08/01/24 2237 08/02/24 0317 08/03/24 0325  NA 127*   < > 125* 122* 121*   < > 127*  125*   < > 128* 130* 129* 128* 132*  K 4.3  --  4.0 4.0 3.9  --  3.9  --   --   --   --  4.0 3.8  CL 90*  --  90* 90* 91*  --  93*  --   --   --   --  96* 99  CO2 20*  --  20* 21* 20*  --  21*  --   --   --   --  22 23  GLUCOSE 89  --  111* 109* 156*  --  92  --   --   --   --  78 82  BUN 12  --   12 12 22   --  17  --   --   --   --  10 9  CREATININE 0.58  --  0.71 0.59 0.58  --  0.51  --   --   --   --  0.63 0.61  CALCIUM 8.7*  --  7.9* 7.7* 7.6*  --  7.9*  --   --   --   --  7.7* 8.0*  MG 1.8  --  2.1  --   --   --  2.0  --   --   --   --  1.8 1.9  PHOS 3.7  --  3.4  --   --   --  3.1  --   --   --   --  4.1 4.0   < > = values in this interval not displayed.    GFR: Estimated Creatinine Clearance: 36 mL/min (by C-G formula based on SCr of 0.61 mg/dL).  Liver Function Tests: Recent Labs  Lab 07/28/24 1528  AST 22  ALT 13  ALKPHOS 55  BILITOT 0.8  PROT 6.7  ALBUMIN 3.0*    CBG: Recent Labs  Lab 07/28/24 2032 07/28/24 2145 07/28/24 2326 07/29/24 0317 07/29/24 0746  GLUCAP 117* 109* 111* 94 83     Recent Results (from the past 240 hours)  MRSA Next Gen by PCR, Nasal     Status: None   Collection Time: 07/28/24  7:02 PM   Specimen: Nasal Mucosa; Nasal Swab  Result Value Ref Range Status   MRSA by PCR Next Gen NOT DETECTED NOT DETECTED Final    Comment: (NOTE) The GeneXpert MRSA Assay (FDA approved for NASAL specimens only), is one component of a comprehensive MRSA colonization surveillance program. It is not intended to diagnose MRSA infection nor to guide or monitor treatment for MRSA infections. Test performance is not FDA approved in patients less than 45 years old. Performed at [facility], [address], [place], [place] [address]   Culture, blood (Routine X 2) w Reflex to ID Panel     Status: None   Collection Time: 07/28/24  8:25 PM   Specimen: BLOOD LEFT FOREARM  Result Value Ref Range Status   Specimen Description BLOOD LEFT FOREARM  Final   Special Requests   Final    BOTTLES DRAWN AEROBIC AND ANAEROBIC Blood Culture results may not be optimal due to an inadequate volume of blood received in culture bottles   Culture   Final    NO GROWTH 5 DAYS Performed at [facility], [address], [place], [place] [address]    Report  Status 08/02/2024 FINAL  Final  Culture, blood (Routine X 2) w Reflex to ID Panel     Status: None   Collection Time: 07/28/24  8:35 PM   Specimen: BLOOD  Result Value Ref Range Status   Specimen Description BLOOD LEFT ANTECUBITAL  Final   Special Requests   Final    BOTTLES DRAWN AEROBIC AND ANAEROBIC Blood Culture results may not be optimal due to an inadequate volume of blood received in culture bottles   Culture   Final    NO GROWTH 5 DAYS Performed at [facility], [address], [place], [place] [address]    Report Status 08/02/2024 FINAL  Final         Radiology Studies: CT Angio Chest Pulmonary Embolism (PE) W or WO Contrast Result Date: 08/02/2024 EXAM: CTA of the Chest with contrast for PE 08/02/2024 12:10:16 AM TECHNIQUE: CTA of the chest was performed after the administration of intravenous contrast. Multiplanar reformatted images are provided for review. MIP images are provided for review. Automated exposure control, iterative reconstruction, and/or weight based adjustment of the mA/kV was utilized to reduce the radiation dose to as low as reasonably achievable. COMPARISON: AP and lateral chest 11/23/2022, and same day CT angiography (CTA) of the head and neck images. CLINICAL HISTORY: FINDINGS: PULMONARY ARTERIES: Pulmonary arteries are adequately opacified for evaluation. There is a subsegmental branch point arterial embolus in the right upper lobe on series 6  axial images 40 through 42 corresponding to the finding noted on the CTA head and neck. No further arterial emboli are identified. The pulmonary arteries and veins are normal caliber. Main pulmonary artery is normal in caliber. MEDIASTINUM: Mild cardiomegaly with left chamber predominance and no evidence of acute right heart strain. There is a small amount of scattered single vessel calcification in the proximal LAD. Mild atherosclerosis and tortuosity of the aorta and great vessels without aneurysm, stenosis, or  dissection. There is a 2-vessel aortic arch with a normal variant brachiocephalic trunk. LYMPH NODES: No mediastinal, hilar or axillary lymphadenopathy. LUNGS AND PLEURA: There is a posterior right diaphragmatic fat herniation into the lower chest. There is mild scattered subpleural reticulation in the upper lobes, mild posterior passive atelectasis in both lungs. There is no consolidation, effusion, pneumothorax, or suspicious lung nodule. UPPER ABDOMEN: There are Bosniak 1 cysts in the upper pole of the kidneys, with a 3 cm cyst measuring 18 Hounsfield units on series 6 axial 127 left upper pole, additional cysts in the right upper pole largest is 2 cm and 20 Hounsfield units. No follow-up imaging is recommended. No other significant upper abdominal findings. SOFT TISSUES AND BONES: There is osteopenia and advanced degenerative change of the thoracic spine. There are mild chronic wedge compression deformities of the T12 and L1 vertebral bodies, also seen on a lumbar spine CT from 09/22/2023. Slight thoracic kyphodextroscoliosis. No acute soft tissue abnormality. IMPRESSION: 1. Subsegmental pulmonary embolus in the right upper lobe, with no additional pulmonary emboli identified. 2. Mild cardiomegaly without evidence of acute right heart strain. 3. Mild chronic changes in the upper lobes . No acute chest CT or CTA findings . 4. Advanced degenerative changes of the thoracic spine. Electronically signed by: Francis Quam MD 08/02/2024 12:54 AM EST RP Workstation: [id number]   CT ANGIO HEAD NECK W WO CM Result Date: 08/01/2024 CLINICAL DATA:  Follow-up examination for stroke. EXAM: CT ANGIOGRAPHY HEAD AND NECK WITH AND WITHOUT CONTRAST TECHNIQUE: Multidetector CT imaging of the head and neck was performed using the standard protocol during bolus administration of intravenous contrast. Multiplanar CT image reconstructions and MIPs were obtained to evaluate the vascular anatomy. Carotid stenosis measurements (when  applicable) are obtained utilizing NASCET criteria, using the distal internal carotid diameter as the denominator. RADIATION DOSE REDUCTION: This exam was performed according to the departmental dose-optimization program which includes automated exposure control, adjustment of the mA and/or kV according to patient size and/or use of iterative reconstruction technique. CONTRAST:  75mL OMNIPAQUE IOHEXOL 350 MG/ML SOLN COMPARISON:  Comparison made with MRI from 07/31/2024 as well as earlier studies. FINDINGS: CT HEAD FINDINGS Brain: Atrophy with advanced chronic microvascular ischemic disease again noted. Scattered areas of chronic cortical mineralization involving the right greater than left parieto-occipital regions, with additional involvement of the perirolandic areas and mesial temporal lobes, stable from prior. Previously noted superimposed ischemic changes not well visualized by CT. No other acute large vessel territory infarct. No acute intracranial hemorrhage. No mass lesion or midline shift. No hydrocephalus or extra-axial fluid collection. Vascular: No abnormal hyperdense vessel. Calcified atherosclerosis present at the skull base. Skull: Scalp soft tissues and calvarium demonstrate no new finding. Sinuses/Orbits: Globes orbital soft tissues within normal limits. Right maxillary sinus retention cyst noted. Minimal layering secretions noted within the left sphenoid sinus. No mastoid effusion. Other: None. Review of the MIP images confirms the above findings CTA NECK FINDINGS Aortic arch: Visualized aortic arch within normal limits for caliber. Bovine branching pattern noted.  A atheromatous change present about the aortic arch. No high-grade stenosis about the origin the great vessels. Right carotid system: Right common and internal carotid arteries are tortuous. No dissection. Bulky calcified plaque about the right carotid bulb with associated stenosis of up to 55% by NASCET criteria. Left carotid system:  Left common and internal carotid arteries are tortuous. No dissection. Moderate calcified plaque about the left carotid bulb without hemodynamically significant greater than 50% stenosis. Vertebral arteries: Both vertebral arteries arise from subclavian arteries. Vertebral arteries are tortuous bilaterally. No stenosis or dissection. Skeleton: No worrisome osseous lesions. Advanced multilevel cervical spondylosis without high-grade spinal stenosis. Other neck: No other acute finding. Upper chest: There is a questionable filling defect within a pulmonary arterial vessel at the right upper lobe (series 10, image 297). While this could be related to timing the contrast bolus, a possible small acute pulmonary embolism is difficult to exclude. No other acute finding. Review of the MIP images confirms the above findings CTA HEAD FINDINGS Anterior circulation: Vermis atheromatous change about the carotid siphons without hemodynamically significant stenosis. A1 segments patent bilaterally. Normal anterior communicating artery complex. Anterior cerebral arteries patent without significant stenosis. No M1 stenosis or occlusion. No proximal MCA branch occlusion or high-grade stenosis. Distal MCA branches perfused and symmetric. Posterior circulation: Both V4 segments widely patent without stenosis. Left PICA patent at its origin. Right PICA not well seen. Basilar patent without stenosis. Superior cerebral arteries patent bilaterally. Left PCA supplied via the basilar as well as a prominent left posterior communicating artery. Predominant fetal type origin of the right PCA supplied via the right PCOM, although note is made of a probable occluded hypoplastic right P1 segment (series 10, image 102). Right PCA widely patent distally. Venous sinuses: Not well assessed due to arterial timing the contrast bolus. Anatomic variants: As above.  No aneurysm. Review of the MIP images confirms the above findings IMPRESSION: CT HEAD: 1. No  acute intracranial abnormality. 2. Atrophy with advanced chronic microvascular ischemic disease. Scattered areas of chronic cortical mineralization involving the right greater than left parieto-occipital regions, with additional involvement of the perirolandic areas and mesial temporal lobes, stable from prior. Previously noted superimposed ischemic changes not well visualized by CT. CTA HEAD AND NECK: 1. Negative CTA for large vessel occlusion or other emergent finding. 2. Bulky calcified plaque about the right carotid bulb with associated stenosis of up to 55% by NASCET criteria. 3. Moderate calcified plaque about the left carotid bulb without hemodynamically significant greater than 50% stenosis. 4. Occluded right P1 segment. There is a robust and widely patent right posterior communicating artery, with wide patency of the right PCA distally. 5. Questionable filling defect within a pulmonary arterial vessel at the right upper lobe. While this could be related to timing the contrast bolus with mixing artifact, a possible small acute pulmonary embolism is difficult to exclude. Further evaluation with dedicated CTA of the chest recommended. 6.  Aortic Atherosclerosis (ICD10-I70.0). These results were communicated to Dr. Michaela at 9:40 pm on 08/01/2024 by text page via the [facility] messaging system. Electronically Signed   By: Morene Hoard M.D.   On: 08/01/2024 21:44   DG Hand 2 View Left Result Date: 08/01/2024 CLINICAL DATA:  Hand pain EXAM: LEFT HAND - 2 VIEW COMPARISON:  None Available. FINDINGS: There is no evidence of fracture or dislocation. Diffuse interphalangeal joint space narrowing, most pronounced at the index and middle finger distal interphalangeal and thumb carpometacarpal joints. Soft tissues are unremarkable. IMPRESSION: 1. No  acute fracture or dislocation. 2. Osteoarthritis. Electronically Signed   By: Limin  Xu M.D.   On: 08/01/2024 13:23   ECHOCARDIOGRAM COMPLETE Result Date:  08/01/2024    ECHOCARDIOGRAM REPORT   Patient Name:   Danielle Vaughan Date of Exam: 08/01/2024 Medical Rec #:  [id number]        Height:       61.0 in Accession #:    [id number]       Weight:       120.1 lb Date of Birth:  23-Jan-1933        BSA:          1.521 m Patient Age:    89 years         BP:           108/69 mmHg Patient Gender: F                HR:           87 bpm. Exam Location:  Inpatient Procedure: 2D Echo, 3D Echo, Cardiac Doppler and Color Doppler (Both Spectral            and Color Flow Doppler were utilized during procedure). Indications:    Stroke  History:        Patient has prior history of Echocardiogram examinations, most                 recent 12/28/2019.  Sonographer:    Philomena Daring Referring Phys: ANIUS.BANISTER MCNEILL P KIRKPATRICK  Sonographer Comments: Image acquisition challenging due to respiratory motion. IMPRESSIONS  1. Left ventricular ejection fraction, by estimation, is 40 to 45%. Left ventricular ejection fraction by 3D volume is 41 %. The left ventricle has mildly decreased function. The left ventricle demonstrates regional wall motion abnormalities (see scoring diagram/findings for description). There is mild left ventricular hypertrophy of the basal-septal segment. Left ventricular diastolic parameters are consistent with Grade I diastolic dysfunction (impaired relaxation).  2. Right ventricular systolic function is normal. The right ventricular size is normal. There is mildly elevated pulmonary artery systolic pressure. The estimated right ventricular systolic pressure is 45.0 mmHg.  3. The mitral valve is normal in structure. Trivial mitral valve regurgitation. No evidence of mitral stenosis.  4. Tricuspid valve regurgitation is moderate.  5. The aortic valve is normal in structure. Aortic valve regurgitation is not visualized. No aortic stenosis is present.  6. The inferior vena cava is normal in size with greater than 50% respiratory variability, suggesting right atrial pressure of 3  mmHg. Conclusion(s)/Recommendation(s): No intracardiac source of embolism detected on this transthoracic study. Consider a transesophageal echocardiogram to exclude cardiac source of embolism if clinically indicated. FINDINGS  Left Ventricle: Left ventricular ejection fraction, by estimation, is 40 to 45%. Left ventricular ejection fraction by 3D volume is 41 %. The left ventricle has mildly decreased function. The left ventricle demonstrates regional wall motion abnormalities. The left ventricular internal cavity size was normal in size. There is mild left ventricular hypertrophy of the basal-septal segment. Left ventricular diastolic parameters are consistent with Grade I diastolic dysfunction (impaired relaxation).  LV Wall Scoring: The inferior wall, mid anteroseptal segment, basal inferolateral segment, mid inferoseptal segment, and basal inferoseptal segment are akinetic. Right Ventricle: The right ventricular size is normal. No increase in right ventricular wall thickness. Right ventricular systolic function is normal. There is mildly elevated pulmonary artery systolic pressure. The tricuspid regurgitant velocity is 3.04  m/s, and with an assumed right atrial pressure of 8 mmHg,  the estimated right ventricular systolic pressure is 45.0 mmHg. Left Atrium: Left atrial size was normal in size. Right Atrium: Right atrial size was normal in size. Pericardium: There is no evidence of pericardial effusion. Mitral Valve: The mitral valve is normal in structure. Trivial mitral valve regurgitation. No evidence of mitral valve stenosis. Tricuspid Valve: The tricuspid valve is normal in structure. Tricuspid valve regurgitation is moderate . No evidence of tricuspid stenosis. Aortic Valve: The aortic valve is normal in structure. Aortic valve regurgitation is not visualized. No aortic stenosis is present. Pulmonic Valve: The pulmonic valve was normal in structure. Pulmonic valve regurgitation is not visualized. No  evidence of pulmonic stenosis. Aorta: The aortic root is normal in size and structure. Venous: The inferior vena cava is normal in size with greater than 50% respiratory variability, suggesting right atrial pressure of 3 mmHg. IAS/Shunts: No atrial level shunt detected by color flow Doppler.  LEFT VENTRICLE PLAX 2D LVIDd:         3.40 cm LVIDs:         2.70 cm LV PW:         1.10 cm         3D Volume EF LV IVS:        1.20 cm         LV 3D EF:    Left LVOT diam:     2.00 cm                      ventricul LV SV:         52                           ar LV SV Index:   34                           ejection LVOT Area:     3.14 cm                     fraction                                             by 3D                                             volume is                                             41 %.                                 3D Volume EF:                                3D EF:        41 %  LV EDV:       125 ml                                LV ESV:       74 ml                                LV SV:        51 ml RIGHT VENTRICLE             IVC RV Basal diam:  3.30 cm     IVC diam: 2.10 cm RV Mid diam:    2.60 cm RV S prime:     17.40 cm/s TAPSE (M-mode): 2.3 cm LEFT ATRIUM             Index        RIGHT ATRIUM           Index LA diam:        2.70 cm 1.78 cm/m   RA Area:     11.20 cm LA Vol (A2C):   42.5 ml 27.94 ml/m  RA Volume:   23.40 ml  15.38 ml/m LA Vol (A4C):   38.8 ml 25.51 ml/m LA Biplane Vol: 41.1 ml 27.02 ml/m  AORTIC VALVE LVOT Vmax:   115.00 cm/s LVOT Vmean:  76.700 cm/s LVOT VTI:    0.164 m  AORTA Ao Root diam: 2.50 cm Ao Asc diam:  3.10 cm TRICUSPID VALVE TR Peak grad:   37.0 mmHg TR Vmax:        304.00 cm/s  SHUNTS Systemic VTI:  0.16 m Systemic Diam: 2.00 cm Oneil Parchment MD Electronically signed by Oneil Parchment MD Signature Date/Time: 08/01/2024/1:11:32 PM    Final          Scheduled Meds:  apixaban  5 mg Oral BID   Chlorhexidine Gluconate Cloth  6  each Topical Daily   cyanocobalamin   1,000 mcg Subcutaneous Daily   feeding supplement  237 mL Oral BID BM   metoprolol tartrate  12.5 mg Oral BID   midodrine  10 mg Oral TID WC   multivitamin  1 tablet Oral BID   pantoprazole  40 mg Oral Daily   sodium chloride  1 g Oral BID WC   Continuous Infusions:     LOS: 6 days       Brayton Lye, MD [facility]   To contact the attending provider between 7A-7P or the covering provider during after hours 7P-7A, please log into the web site [email or web address] and access using universal [place] password for that web site. If you do not have the password, please call the hospital operator.  08/03/2024, 9:17 AM

## 2024-08-03 NOTE — Progress Notes (Signed)
 This chaplain responded to PMT Dr. Cecil consult for creating the Pt. Advance Directive. The chaplain reviewed the chart notes and received an update from the Pt. RN-Ranjita before the visit. The chaplain assessed the Pt. ability to complete an AD at the time of the visit.   The Pt. is sleeping when the chaplain enters. The Pt. sister-Carolyn is visiting at the bedside and introduces herself.  The Pt. describes herself as rocky as she wakes up. The Pt. is able to tell the chaplain who is visiting but unable to give meaning to the word rocky.  The chaplain listens reflectively as Elveria and the Pt. attempt to define their relationship in the Pt family. The chaplain observes the Pt. is unable to finish her thoughts in the time spent together. Completion of an Advance Directive requires the Pt. participate in AD education and answer the chaplain's clarifying questions. The chaplain is unable to complete HCPOA at this time.  Chaplain Leeroy Hummer [phone]

## 2024-08-03 NOTE — Progress Notes (Signed)
 [facility] Progress Note  10/29 w/ stroke-like symptoms. AMS at baseline, hx of dementia, partial seizures, CKD 2, HTN, HL, hx CVA. She fell 9 days prior and family said was unable to walk since. Was getting abx for UTI. In ED BP 130/78, HR 98-105, RR 16-21, temp 98.6. 98% on RA. Labs showed Na+ 119, BUN 25, creat 1.00, alb 3.0, lft's negative. WBC 10K, Hb 11.6. Neuro was consulted. CCM gave pt 3% saline and pt admitted to ICU. Na+ improved to 127 from 119 over about 24 hrs. TSH and cortisol were wnl. Then yest Na+ dropped to 125 and today 122.   Home meds: Norvasc, benazepril, advil, toprol xl, diflucan, ketoconazole, menthol topical   IP meds of interest: Midodrine 5 mg tid started here for hypotension   Serum osm 252 ( on admit) Cortisol 19, TSH 0.472 UN 69, UOsm 318   Assessment/ Plan: Hyponatremia: looks euvolemic on exam. Urine sodium was 69, UOsm 318, suggesting SIADH picture. TSH and cortisol wnl, no signs of cirrhosis/ CHF/ renal failure. Responded to 3% saline earlier here. Don't see any medications that would cause this. Unclear cause. Cont to hold acei for another 2-3 weeks. Na+ was up to 125-127 this am, then down to 124 at 10am. RN states she has taken all her salt tabs (crushed in applesauce).  Continue NaCl 2 gm tid and fluid restriction (1000-1200 cc/d); I don't believe she's drinking too much. Only drinking whatever is given to her. -> decr to 1gm BID Signing off at this time; please reconsult as needed.  Fortunately did not require tolvaptan (she appears to be euvolemic).   Dementia AMS HTN: as above UTI: on rocephin H/o partial seizures: not on medication H/o CVA  Subjective:  Taking all her pills crushed in applesauce, including salt tablets RN says she is taking the salt pills; updated son and family who were bedside   Vitals:   08/02/24 1600 08/02/24 2000 08/02/24 2141 08/03/24 0500  BP: (!) 141/71 111/77 113/82   Pulse: 72 75 78   Resp:  19 15    Temp: 98.4 F (36.9 C) 98.7 F (37.1 C)    TempSrc: Oral Axillary    SpO2: 99% 99%    Weight:    54.3 kg  Height:        Exam: Gen alert, no distress No jvd or bruits Chest clear bilat to bases RRR no MRG Abd soft ntnd no mass or ascites +bs Ext no LE edema, no other edema Neuro is alert, nf         Recent Labs  Lab 07/28/24 1528 07/28/24 1533 08/02/24 0317 08/03/24 0325  HGB 11.2*   < > 8.8* 9.2*  ALBUMIN 3.0*  --   --   --   CALCIUM 8.4*   < > 7.7* 8.0*  PHOS  --    < > 4.1 4.0  CREATININE 0.86   < > 0.63 0.61  K 4.8   < > 4.0 3.8   < > = values in this interval not displayed.   No results for input(s): IRON, TIBC, FERRITIN in the last 168 hours. Inpatient medications:  apixaban  5 mg Oral BID   Chlorhexidine Gluconate Cloth  6 each Topical Daily   cyanocobalamin   1,000 mcg Subcutaneous Daily   feeding supplement  237 mL Oral BID BM   metoprolol tartrate  12.5 mg Oral BID   midodrine  10 mg Oral TID WC   multivitamin  1 tablet Oral  BID   pantoprazole  40 mg Oral Daily   sodium chloride  1 g Oral BID WC    acetaminophen , docusate sodium, metoprolol tartrate, mouth rinse, polyethylene glycol

## 2024-08-03 NOTE — Plan of Care (Signed)
  Problem: Education: Goal: Knowledge of General Education information will improve Description: Including pain rating scale, medication(s)/side effects and non-pharmacologic comfort measures Outcome: Progressing   Problem: Clinical Measurements: Goal: Ability to maintain clinical measurements within normal limits will improve Outcome: Progressing Goal: Diagnostic test results will improve Outcome: Progressing   Problem: Activity: Goal: Risk for activity intolerance will decrease Outcome: Progressing   Problem: Nutrition: Goal: Adequate nutrition will be maintained Outcome: Progressing   Problem: Safety: Goal: Ability to remain free from injury will improve Outcome: Progressing   Problem: Education: Goal: Knowledge of disease or condition will improve Outcome: Progressing Goal: Knowledge of secondary prevention will improve (MUST DOCUMENT ALL) Outcome: Progressing Goal: Knowledge of patient specific risk factors will improve (DELETE if not current risk factor) Outcome: Progressing   Problem: Ischemic Stroke/TIA Tissue Perfusion: Goal: Complications of ischemic stroke/TIA will be minimized Outcome: Progressing   Problem: Coping: Goal: Will verbalize positive feelings about self Outcome: Progressing

## 2024-08-03 NOTE — Plan of Care (Signed)
  Problem: Clinical Measurements: Goal: Diagnostic test results will improve Outcome: Progressing   Problem: Nutrition: Goal: Adequate nutrition will be maintained Outcome: Progressing   Problem: Safety: Goal: Ability to remain free from injury will improve Outcome: Progressing   Problem: Coping: Goal: Will verbalize positive feelings about self Outcome: Progressing   Problem: Nutrition: Goal: Risk of aspiration will decrease Outcome: Progressing

## 2024-08-03 NOTE — Progress Notes (Signed)
 Daily Progress Note   Patient Name: Danielle Vaughan       Date: 08/03/2024 DOB: [birth year]  Age: 89 y.o. MRN#: [id number] Attending Physician: Sherlon Brayton RAMAN, MD Primary Care Physician: [name], MD Admit Date: 07/28/2024 Length of Stay: 6 days  Reason for Consultation/Follow-up: Establishing goals of care  Subjective:   CC:I'm doing good  Subjective:  EMR reviewed including personal review of labs that are notable for sodium of 128 this morning.  Hemoglobin is 8.8.  And platelets are 598.  Reviewed most recent hospitalist, nephrology, and neurology notes.  Reviewed results of imaging completed over the weekend.  I saw and examined Ms. Leinweber today.  Initial encounter this morning with multiple friends at the bedside in addition to her family.  Her daughter asked about completion of HCPOA documentation at that time, but we did not have further discussion regarding goals of care as there were visitors present.  I returned in the afternoon and met again with patient, her sister, her daughter, and a cousin.  Patient is pleasantly confused but able to recognize her daughter (primarily by her voice).  She does not really have insight into her medical situation.  Reviewed with Glenda events of this weekend with discovery of PE, stroke, and hyponatremia etiology likely being SIADH.  We talked about each of these in detail and I answered questions regarding current diagnoses and care plan moving forward.  We talked about hopes for her care in the future and family is invested in continuation of current interventions with hope she will continue to improve.  We did touch on consideration for limitations of care, particularly as family reports that they do not think that she would want a long-term feeding tube.  Gaetana feels that they need to focus on trying to establish HCPOA prior to having further discussion regarding goals of care.    While patient is pleasantly confused, she does  tell me when asked that Gaetana is the person that helps her with decisions.  When asked who knows her the best to assist in decision making if she is not able to make her own decisions, she reports, Glenda.  Then [place].  Discussed that while she is consistent today with stating she would want her daughter to act as her surrogate, this can wax and wane and does not guarantee we will be able to complete HCPOA documentation.  Will consult spiritual care for assistance.  Review of Systems Denies complaints today before story  Objective:   Vital Signs:  BP 137/83 (BP Location: Left Arm)   Pulse 78   Temp 98.2 F (36.8 C) (Oral)   Resp (!) 21   Ht 5' 1 (1.549 m)   Wt 54.3 kg   SpO2 95%   BMI 22.62 kg/m   Physical Exam: General: NAD, alert, frail Eyes: conjunctiva clear, anicteric sclera HENT: normocephalic, atraumatic, moist mucous membranes Cardiovascular: RRR Respiratory: no increased work of breathing noted, not in respiratory distress Abdomen: not distended Skin: no rashes or lesions on visible skin Neuro: Oriented to self and knows she is in the hospital, knows family at bedside, following commands easily  Imaging: @IMAGES @  I personally reviewed recent imaging.   Assessment & Plan:   Assessment:  Recommendations/Plan: # Complex medical decision making/goals of care:  - Discussed again regarding goals of care, limitations of care, and advance care planning.  Daughter is hopeful that healthcare power of attorney documentation can be completed establishing her as patient's surrogate decision maker.  While patient does not have good insight into her medical condition, she is very consistent today and stating that she would like Glenda to help make decisions on her behalf.  While I do not think she has decision-making capacity for her own medical decisions due to lack of insight into her medical situation, I do think that she has capacity at this point to state who she would  want to act on her behalf.  This, however, can certainly wax and wane and I explained to family that her being able to state that now does not guarantee that will be the case when the time comes to complete documentation to establish HCPOA.  Will place consult to spiritual care to assess for HCPOA completion.  Otherwise, care plan remains to continue with current interventions with hope that she will be able to transition to rehab and eventually return to her home.  She remains full code/full scope treatment at this point  Prognosis: Unable to determine  # Symptom management: Patient is receiving these palliative interventions for symptom management with an intent to improve quality of life.   - Pain: Acetaminophen  as needed  - Constipation: MiraLAX as needed  # Psychosocial Support:  - Family, including 2 sons and daughter  # Discharge Planning: To Be Determined  -  Discussed with: Patient, daughter, other family at bedside  Thank you for allowing the palliative care team to participate in the care Naomie JONETTA Muskrat.  Amaryllis Meissner, MD Palliative Care Provider PMT # [phone]  If patient remains symptomatic despite maximum doses, please call PMT at [phone] between 0700 and 1900. Outside of these hours, please call attending, as PMT does not have night coverage.   I personally spent a total of 55 minutes in the care of the patient today including preparing to see the patient, getting/reviewing separately obtained history, performing a medically appropriate exam/evaluation, counseling and educating, referring and communicating with other health care professionals, and documenting clinical information in the EHR.

## 2024-08-03 NOTE — Progress Notes (Signed)
 Physical Therapy Treatment Patient Details Name: Danielle Vaughan MRN: [id number] DOB: 1933-08-12 Today's Date: 08/03/2024   History of Present Illness 89 year old female adm 10/29 with stroke like symptoms. 10/29 Brain MRI suggested ischemia. 11/1 Brain MRI showed multifocal posterior circulation predominant infarcts. PMHx: stroke, hypertension, hyperlipidemia, pre-diabetes, CKD II, partial seizures, dementia with baseline orientation to self and place.    PT Comments  Pt with increased level of arousal and improved participation in today's session. Educated pt/family on BLE HEP. She performed ankle pumps, quad sets, and glute sets slowly with moderate cues for sequencing. Pt required assistance with heel slides and hip abd/add. She transferred to recliner chair via squat pivot and max-totalA x2. Pt attempted to complete sit<>stand from raised bed height with 2 HHA but was unable to achieve erect standing posture, lacking full knee and hip extension bilaterally. Patient will benefit from continued inpatient follow up therapy, <3 hours/day.    If plan is discharge home, recommend the following: Two people to help with walking and/or transfers;Two people to help with bathing/dressing/bathroom;Direct supervision/assist for medications management;Direct supervision/assist for financial management;Assist for transportation;Help with stairs or ramp for entrance;Supervision due to cognitive status   Can travel by private vehicle     No  Equipment Recommendations  Hoyer lift;Hospital bed    Recommendations for Other Services       Precautions / Restrictions Precautions Precautions: Fall Recall of Precautions/Restrictions: Impaired Restrictions Weight Bearing Restrictions Per Provider Order: No     Mobility  Bed Mobility Overal bed mobility: Needs Assistance Bed Mobility: Rolling, Sidelying to Sit Rolling: Total assist, +2 for physical assistance Sidelying to sit: Max assist, Total assist,  +2 for physical assistance, HOB elevated, Used rails       General bed mobility comments: Pt sat up on R side of bed with increased time. Cues for sequencing. She rolled onto R side with assist at trunk/pelvis and use of bed pad to pivot her. Guided pt's hand to bed rail. She slightly pulled up. Assist to bring BLE off EOB, elevate trunk, and scoot hips fwd.    Transfers Overall transfer level: Needs assistance Equipment used: 2 person hand held assist Transfers: Bed to chair/wheelchair/BSC       Squat pivot transfers: Max assist, Total assist, +2 physical assistance     General transfer comment: Pt held onto PT and Therapy Tech's upper arms. She attempted sit<>stand from EOB with 2 HHA. Pt was unable to achieve full B knee or hip ext. Returned pt to sitting. She was able to power up into a semi-squated position achieveing B hip clearence from bed. Transferred pt to drop arm recliner chair on right positioned touching bed. Guided pt's hips and pivoted her on her feet.    Ambulation/Gait               General Gait Details: unable   Stairs             Wheelchair Mobility     Tilt Bed    Modified Rankin (Stroke Patients Only)       Balance Overall balance assessment: Needs assistance Sitting-balance support: Feet supported, Bilateral upper extremity supported Sitting balance-Leahy Scale: Poor Sitting balance - Comments: Pt sat EOB with mod-maxA to maintain upright neutral posture. Postural control: Posterior lean, Right lateral lean Standing balance support: Bilateral upper extremity supported Standing balance-Leahy Scale: Zero Standing balance comment: Pt dependent on external support of therapists  Communication Communication Communication: Impaired Factors Affecting Communication: Difficulty expressing self;Reduced clarity of speech;Hearing impaired  Cognition Arousal: Alert Behavior During Therapy: Flat affect    PT - Cognitive impairments: History of cognitive impairments (Dementia)                         Following commands: Impaired Following commands impaired: Follows one step commands with increased time    Cueing Cueing Techniques: Verbal cues, Gestural cues, Tactile cues  Exercises General Exercises - Lower Extremity Ankle Circles/Pumps: Supine, Both, AROM, 5 reps Quad Sets: Supine, Both, AROM, 5 reps Gluteal Sets: Supine, Both, AROM, 5 reps Heel Slides: Supine, AAROM, Both, 5 reps Hip ABduction/ADduction: Supine, AAROM, Both, 5 reps    General Comments General comments (skin integrity, edema, etc.): Pt with intermittent runs of tachycardia, jumping from 100s up to 140-160s briefly before returning to her resting rate. SpO2 90-95% on RA.      Pertinent Vitals/Pain Pain Assessment Pain Assessment: PAINAD Breathing: normal Negative Vocalization: none Facial Expression: smiling or inexpressive Body Language: relaxed Consolability: no need to console PAINAD Score: 0    Home Living                          Prior Function            PT Goals (current goals can now be found in the care plan section) Acute Rehab PT Goals Patient Stated Goal: Feel better PT Goal Formulation: Patient unable to participate in goal setting Time For Goal Achievement: 08/13/24 Potential to Achieve Goals: Fair Progress towards PT goals: Progressing toward goals    Frequency    Min 2X/week      PT Plan      Co-evaluation              AM-PAC PT 6 Clicks Mobility   Outcome Measure  Help needed turning from your back to your side while in a flat bed without using bedrails?: Total Help needed moving from lying on your back to sitting on the side of a flat bed without using bedrails?: Total Help needed moving to and from a bed to a chair (including a wheelchair)?: Total Help needed standing up from a chair using your arms (e.g., wheelchair or bedside chair)?:  Total Help needed to walk in hospital room?: Total Help needed climbing 3-5 steps with a railing? : Total 6 Click Score: 6    End of Session Equipment Utilized During Treatment: Gait belt Activity Tolerance: Patient tolerated treatment well;Patient limited by fatigue Patient left: in chair;with call bell/phone within reach;with chair alarm set;with family/visitor present Nurse Communication: Mobility status;Need for lift equipment PT Visit Diagnosis: Other abnormalities of gait and mobility (R26.89)     Time: 8597-8572 PT Time Calculation (min) (ACUTE ONLY): 25 min  Charges:    $Therapeutic Exercise: 8-22 mins $Therapeutic Activity: 8-22 mins PT General Charges $$ ACUTE PT VISIT: 1 Visit                     Randall SAUNDERS, PT, DPT Acute Rehabilitation Services Office: [phone] Secure Chat Preferred  Delon CHRISTELLA Callander 08/03/2024, 3:20 PM

## 2024-08-04 DIAGNOSIS — I471 Supraventricular tachycardia, unspecified: Secondary | ICD-10-CM

## 2024-08-04 DIAGNOSIS — I480 Paroxysmal atrial fibrillation: Secondary | ICD-10-CM | POA: Diagnosis not present

## 2024-08-04 DIAGNOSIS — E871 Hypo-osmolality and hyponatremia: Secondary | ICD-10-CM | POA: Diagnosis not present

## 2024-08-04 LAB — BASIC METABOLIC PANEL WITH GFR
Anion gap: 12 (ref 5–15)
BUN: 9 mg/dL (ref 8–23)
CO2: 22 mmol/L (ref 22–32)
Calcium: 7.9 mg/dL — ABNORMAL LOW (ref 8.9–10.3)
Chloride: 98 mmol/L (ref 98–111)
Creatinine, Ser: 0.6 mg/dL (ref 0.44–1.00)
GFR, Estimated: >60 mL/min (ref >60)
Glucose, Bld: 93 mg/dL (ref 70–99)
Potassium: 4 mmol/L (ref 3.5–5.1)
Sodium: 132 mmol/L — ABNORMAL LOW (ref 135–145)

## 2024-08-04 LAB — CBC
HCT: 26.5 % — ABNORMAL LOW (ref 36.0–46.0)
Hemoglobin: 9.1 g/dL — ABNORMAL LOW (ref 12.0–15.0)
MCH: 31.1 pg (ref 26.0–34.0)
MCHC: 34.3 g/dL (ref 30.0–36.0)
MCV: 90.4 fL (ref 80.0–100.0)
Platelets: 712 K/uL — ABNORMAL HIGH (ref 150–400)
RBC: 2.93 MIL/uL — ABNORMAL LOW (ref 3.87–5.11)
RDW: 17.2 % — ABNORMAL HIGH (ref 11.5–15.5)
WBC: 8.5 K/uL (ref 4.0–10.5)
nRBC: 0 % (ref 0.0–0.2)

## 2024-08-04 LAB — PHOSPHORUS: Phosphorus: 4 mg/dL (ref 2.5–4.6)

## 2024-08-04 LAB — MAGNESIUM: Magnesium: 1.8 mg/dL (ref 1.7–2.4)

## 2024-08-04 NOTE — Progress Notes (Signed)
   08/04/24 1505  Mobility  Activity Mechanically lifted from chair to bed  Level of Assistance Maximum assist, patient does 25-49% (+2)  Assistive Device MaxiMove  Activity Response Tolerated fair  Mobility Referral Yes  Mobility visit 1 Mobility  Mobility Specialist Start Time (ACUTE ONLY) 1505  Mobility Specialist Stop Time (ACUTE ONLY) 1525  Mobility Specialist Time Calculation (min) (ACUTE ONLY) 20 min   Mobility Specialist: Progress Note  NT requested assistance with transfer - Pt agreeable to mobility session - received in chair. C/o back pain throughout with grimacing and yelling at times. Returned to bed with all needs met - call bell within reach.   Additional comments: Granddaughter present. Pt required MaxA+2 for rolling to place and remove lift pad in addition to pericare for very small stool smear.   Virgle Boards, BS Mobility Specialist Please contact via SecureChat or  Rehab office at [phone].

## 2024-08-04 NOTE — Progress Notes (Signed)
 Occupational Therapy Treatment Patient Details Name: MACKENIZE DELGADILLO MRN: [id number] DOB: 1933-04-03 Today's Date: 08/04/2024   History of present illness 89 year old female adm 10/29 with stroke like symptoms. 10/29 Brain MRI suggested ischemia. 11/1 Brain MRI showed multifocal posterior circulation predominant infarcts. PMHx: stroke, hypertension, hyperlipidemia, pre-diabetes, CKD II, partial seizures, dementia with baseline orientation to self and place   OT comments  Pt with slow, steady progression toward established OT goals. Low vision significantly limiting communication, social interaction, functional mobility and participation in meaningful ADL. Focus session on attempts at further visual assessment. Pt with awkward scanning pattern needing cues to locate therapist face. Able to focus for longer periods of time when one eye covered, but unclear whether pt has any diplopia. Pt needing max cues and assist for locating grooming items and cues for proprioception during functional use in addition to cues for sequencing. Max A +2 for squat pivot to chair with chair alarm on and 2 visitors present. Will continue to follow.       If plan is discharge home, recommend the following:  Two people to help with walking and/or transfers;A lot of help with bathing/dressing/bathroom   Equipment Recommendations  Other (comment) (defer)    Recommendations for Other Services      Precautions / Restrictions Precautions Precautions: Fall Recall of Precautions/Restrictions: Impaired Restrictions Weight Bearing Restrictions Per Provider Order: No       Mobility Bed Mobility Overal bed mobility: Needs Assistance Bed Mobility: Supine to Sit     Supine to sit: Max assist, +2 for physical assistance, +2 for safety/equipment     General bed mobility comments: dense multimodal cueing to optimize pt participation. significantly limited by vision    Transfers Overall transfer level: Needs  assistance Equipment used: 2 person hand held assist Transfers: Bed to chair/wheelchair/BSC     Squat pivot transfers: Max assist, Total assist, +2 physical assistance       General transfer comment: Pt held onto OT and Therapy Tech's upper arms. She attempted sit<>stand from EOB with 2 HHA, but able power up into a semi-squated position achieveing B hip clearence from bed. Transferred pt to drop arm recliner chair on right positioned touching bed. Guided pt's hips and pivoted her on her feet.     Balance Overall balance assessment: Needs assistance Sitting-balance support: Feet supported, Bilateral upper extremity supported Sitting balance-Leahy Scale: Poor Sitting balance - Comments: CGA static sitting EOB   Standing balance support: Bilateral upper extremity supported Standing balance-Leahy Scale: Zero                             ADL either performed or assessed with clinical judgement   ADL Overall ADL's : Needs assistance/impaired     Grooming: Moderate assistance;Maximal assistance;Sitting;Oral care Grooming Details (indicate cue type and reason): max A to locate grooming items, cues for proprioception bringing toothbrush or cup to mouth                 Toilet Transfer: Maximal assistance;+2 for safety/equipment;Squat-pivot Toilet Transfer Details (indicate cue type and reason): simulated to chair         Functional mobility during ADLs: Maximal assistance;+2 for safety/equipment      Extremity/Trunk Assessment Upper Extremity Assessment Upper Extremity Assessment: RUE deficits/detail;LUE deficits/detail RUE Deficits / Details: poor proprcioception with overshooting with attempts to reach face LUE Deficits / Details: generally weak, poor initiation.   Lower Extremity Assessment Lower Extremity Assessment: Defer to  PT evaluation        Vision   Vision Assessment?: Yes;Vision impaired- to be further tested in functional context Eye Alignment:  Impaired (comment) Additional Comments: pt with difficulty locating therapists in room, during attempt with very awkward scanning pattern predominantly focusing on upper quadrants. once located therapist, unable to maintain visual focus and unclear whether pt is receiving distorted image. unable to locate items in room or grooming items even when placed in direct line of sight. worked on focusing on therapist face with bil eyes open as well as with one eye covered. Pt does seem to focus for longer periods of time with one eye covered, but unable to decipher if more comfortable this way.   Perception Perception Perception: Not tested   Praxis Praxis Praxis: Not tested   Communication Communication Communication: Impaired Factors Affecting Communication: Difficulty expressing self;Reduced clarity of speech;Hearing impaired (significantly increased time to follow commands.)   Cognition Arousal: Alert Behavior During Therapy: Flat affect Cognition: Cognition impaired   Orientation impairments: Time, Situation, Place Awareness: Online awareness impaired, Intellectual awareness impaired Memory impairment (select all impairments): Short-term memory Attention impairment (select first level of impairment): Sustained attention Executive functioning impairment (select all impairments): Initiation, Problem solving OT - Cognition Comments: ~10 second delay to follow commands.                 Following commands: Impaired Following commands impaired: Follows one step commands with increased time      Cueing   Cueing Techniques: Verbal cues, Gestural cues, Tactile cues  Exercises      Shoulder Instructions       General Comments      Pertinent Vitals/ Pain       Pain Assessment Pain Assessment: Faces Faces Pain Scale: No hurt Pain Intervention(s): Monitored during session  Home Living                                          Prior Functioning/Environment               Frequency  Min 2X/week        Progress Toward Goals  OT Goals(current goals can now be found in the care plan section)  Progress towards OT goals: Progressing toward goals  Acute Rehab OT Goals Patient Stated Goal: unable to state OT Goal Formulation: Patient unable to participate in goal setting Time For Goal Achievement: 08/13/24 Potential to Achieve Goals: Fair ADL Goals Pt Will Perform Eating: with set-up;sitting Pt Will Perform Grooming: with set-up;sitting Pt Will Perform Upper Body Dressing: with min assist;sitting Pt Will Transfer to Toilet: with min assist;with +2 assist;ambulating;bedside commode  Plan      Co-evaluation                 AM-PAC OT 6 Clicks Daily Activity     Outcome Measure   Help from another person eating meals?: A Lot Help from another person taking care of personal grooming?: A Lot Help from another person toileting, which includes using toliet, bedpan, or urinal?: A Lot Help from another person bathing (including washing, rinsing, drying)?: Total Help from another person to put on and taking off regular upper body clothing?: A Lot Help from another person to put on and taking off regular lower body clothing?: Total 6 Click Score: 10    End of Session Equipment Utilized During Treatment: Gait belt  OT Visit Diagnosis: Unsteadiness on feet (R26.81);Muscle weakness (generalized) (M62.81);History of falling (Z91.81);Pain   Activity Tolerance Patient tolerated treatment well   Patient Left in chair;with call bell/phone within reach;with chair alarm set;with family/visitor present   Nurse Communication Mobility status;Need for lift equipment        Time: 8863-8785 OT Time Calculation (min): 38 min  Charges: OT General Charges $OT Visit: 1 Visit OT Treatments $Self Care/Home Management : 38-52 mins  Elma JONETTA Lebron FREDERICK, OTR/L [facility] Acute Rehabilitation Office: [phone]   Elma JONETTA Lebron 08/04/2024, 2:20  PM

## 2024-08-04 NOTE — Progress Notes (Addendum)
 PROGRESS NOTE        PATIENT DETAILS Name: Danielle Vaughan Age: 89 y.o. Sex: female Date of Birth: 09-23-1933 Admit Date: 07/28/2024 Admitting Physician Terry LOISE Hurst, DO ERE:Mjfjryjwimjw, Ajith, MD  Brief Summary: Patient is a 89 y.o.  female with history of CVA, HTN, HLD, partial seizures, dementia-who presented to the hospital with strokelike symptoms-upon further evaluation-patient was found to have hyponatremia, acute embolic CVA, and acute pulmonary embolism.  Significant events: 10/29>> admit to ICU by PCCM 10/31>> transferred to [facility]  Significant studies: 10/29>> CT head: No acute intracranial abnormality 10/29>> MRI brain: Scattered foci of diffusion signal abnormality in the posterior frontal/parietal/occipital lobe. 10/29>> CT C-spine: No acute abnormality. 10/30>> A1c: 5.3 11/01>> MRI brain: Resolving diffusion abnormalities-posterior hemisphere with no new ischemic lesions. 11/02>> CT angio head/neck: No LVO-right carotid bulb stenosis-55%, left carotid bulb stenosis-50%. 11/02>> CTA chest: Subsegmental pulmonary embolus right upper lobe. 11/02>> echo: EF 40-45%, numerous wall motion abnormalities. 11/02>> LDL: 21  Significant microbiology data: 10/29>> blood culture: No growth  Procedures: None  Consults: PCCM Neurology  Subjective:    Objective: Vitals: Blood pressure 138/79, pulse 75, temperature 97.7 F (36.5 C), temperature source Axillary, resp. rate 18, height 5' 1 (1.549 m), weight 53.6 kg, SpO2 97%.   Exam: Gen Exam:Alert awake-not in any distress HEENT:atraumatic, normocephalic Chest: B/L clear to auscultation anteriorly CVS:S1S2 regular Abdomen:soft non tender, non distended Extremities:no edema Neurology: Non focal Skin: no rash  Pertinent Labs/Radiology:    Latest Ref Rng & Units 08/04/2024    2:21 AM 08/03/2024    3:25 AM 08/02/2024    3:17 AM  CBC  WBC 4.0 - 10.5 K/uL 8.5  8.1  9.2   Hemoglobin 12.0  - 15.0 g/dL 9.1  9.2  8.8   Hematocrit 36.0 - 46.0 % 26.5  26.7  25.4   Platelets 150 - 400 K/uL 712  672  598     Lab Results  Component Value Date   NA 132 (L) 08/04/2024   K 4.0 08/04/2024   CL 98 08/04/2024   CO2 22 08/04/2024      Assessment/Plan: Hyponatremia Suspicion for SIADH Required 3% saline while in the ICU Currently sodium levels have stabilized with salt tablets and fluid restriction.  Acute metabolic encephalopathy Secondary to hyponatremia/UTI Clinically improved-daughter at bedside-answering questions appropriately this morning Continue to maintain delirium precautions-has history of dementia.  Acute CVA Presumed embolic Workup as above No major focal deficits-has generalized weakness SNF planned on discharge. Continue Eliquis.  Acute pulmonary embolism Hemodynamics stable-on room air On Eliquis  Paroxysmal atrial flutter  Paroxysmal SVT Maintaining sinus rhythm Continue Eliquis/beta-blocker Follow-up in A-fib clinic.  Newly diagnosed-chronic HFrEF EF slightly reduced on echo-numerous wall motion abnormalities This is incidental-volume status is stable Here with CVA/PE-suspect further workup-including cardiology evaluation can be deferred to the outpatient setting.  Given advanced age-she may not be a candidate for aggressive care in any event.  Complicated UTI Unfortunately urine culture was never sent-but UA was grossly abnormal Treated with Rocephin x 3 days. Currently stable off antibiotics.  Hypotension BP stable on midodrine.  History of hypertension BP stable On midodrine given CVA On beta-blocker-mostly for SVT/atrial flutter.  Left hand swelling Resolved-x-ray negative.  History of dementia Maintain delirium precautions Previously tried on Namenda-this was stopped due to dizzy spells  Pressure Ulcer: Agree with assessment as outlined  below. Wound 07/28/24 2200 Pressure Injury Sacrum Medial;Right;Left Deep Tissue Pressure  Injury - Purple or maroon localized area of discolored intact skin or blood-filled blister due to damage of underlying soft tissue from pressure and/or shear. (Active)    Code status:   Code Status: Full Code   DVT Prophylaxis: SCDs Start: 07/28/24 1902 apixaban (ELIQUIS) tablet 5 mg     Family Communication: Daughter at bedside   Disposition Plan: Status is: Inpatient Remains inpatient appropriate because: Severity of illness   Planned Discharge Destination:Skilled nursing facility   Diet: Diet Order             DIET DYS 3 Room service appropriate? No; Fluid consistency: Thin; Fluid restriction: 1200 mL Fluid  Diet effective now                     Antimicrobial agents: Anti-infectives (From admission, onward)    Start     Dose/Rate Route Frequency Ordered Stop   07/29/24 1930  cefTRIAXone (ROCEPHIN) 1 g in sodium chloride 0.9 % 100 mL IVPB        1 g 200 mL/hr over 30 Minutes Intravenous Every 24 hours 07/29/24 0921 07/30/24 1913   07/29/24 1015  fluconazole (DIFLUCAN) tablet 150 mg        150 mg Oral Daily 07/29/24 0920 08/02/24 0959   07/28/24 1930  cefTRIAXone (ROCEPHIN) 1 g in sodium chloride 0.9 % 100 mL IVPB  Status:  Discontinued        1 g 200 mL/hr over 30 Minutes Intravenous Every 24 hours 07/28/24 1923 07/29/24 9078        MEDICATIONS: Scheduled Meds:  apixaban  5 mg Oral BID   Chlorhexidine Gluconate Cloth  6 each Topical Daily   cyanocobalamin   1,000 mcg Subcutaneous Daily   feeding supplement  237 mL Oral BID BM   metoprolol tartrate  12.5 mg Oral BID   midodrine  10 mg Oral TID WC   multivitamin  1 tablet Oral BID   pantoprazole  40 mg Oral Daily   sodium chloride  1 g Oral BID WC   Continuous Infusions: PRN Meds:.acetaminophen , docusate sodium, metoprolol tartrate, mouth rinse, polyethylene glycol   I have personally reviewed following labs and imaging studies  LABORATORY DATA: CBC: Recent Labs  Lab 07/28/24 1528  07/28/24 1533 07/31/24 0224 08/01/24 0338 08/02/24 0317 08/03/24 0325 08/04/24 0221  WBC 10.3   < > 12.0* 9.7 9.2 8.1 8.5  NEUTROABS 7.1  --   --   --   --   --   --   HGB 11.2*   < > 9.6* 9.1* 8.8* 9.2* 9.1*  HCT 31.6*   < > 27.4* 26.3* 25.4* 26.7* 26.5*  MCV 88.3   < > 88.4 89.2 89.8 90.5 90.4  PLT 543*   < > 552* 528* 598* 672* 712*   < > = values in this interval not displayed.    Basic Metabolic Panel: Recent Labs  Lab 07/30/24 0548 07/31/24 0224 07/31/24 1749 07/31/24 1959 08/01/24 0338 08/01/24 0954 08/01/24 1731 08/01/24 2237 08/02/24 0317 08/03/24 0325 08/04/24 0221  NA 125*   < > 121*   < > 127*  125*   < > 130* 129* 128* 132* 132*  K 4.0   < > 3.9  --  3.9  --   --   --  4.0 3.8 4.0  CL 90*   < > 91*  --  93*  --   --   --  96* 99 98  CO2 20*   < > 20*  --  21*  --   --   --  22 23 22   GLUCOSE 111*   < > 156*  --  92  --   --   --  78 82 93  BUN 12   < > 22  --  17  --   --   --  10 9 9   CREATININE 0.71   < > 0.58  --  0.51  --   --   --  0.63 0.61 0.60  CALCIUM 7.9*   < > 7.6*  --  7.9*  --   --   --  7.7* 8.0* 7.9*  MG 2.1  --   --   --  2.0  --   --   --  1.8 1.9 1.8  PHOS 3.4  --   --   --  3.1  --   --   --  4.1 4.0 4.0   < > = values in this interval not displayed.    GFR: Estimated Creatinine Clearance: 36 mL/min (by C-G formula based on SCr of 0.6 mg/dL).  Liver Function Tests: Recent Labs  Lab 07/28/24 1528  AST 22  ALT 13  ALKPHOS 55  BILITOT 0.8  PROT 6.7  ALBUMIN 3.0*   No results for input(s): LIPASE, AMYLASE in the last 168 hours. No results for input(s): AMMONIA in the last 168 hours.  Coagulation Profile: Recent Labs  Lab 07/28/24 1528  INR 1.1    Cardiac Enzymes: No results for input(s): CKTOTAL, CKMB, CKMBINDEX, TROPONINI in the last 168 hours.  BNP (last 3 results) No results for input(s): PROBNP in the last 8760 hours.  Lipid Profile: No results for input(s): CHOL, HDL, LDLCALC, TRIG,  CHOLHDL, LDLDIRECT in the last 72 hours.  Thyroid  Function Tests: No results for input(s): TSH, T4TOTAL, FREET4, T3FREE, THYROIDAB in the last 72 hours.  Anemia Panel: No results for input(s): VITAMINB12, FOLATE, FERRITIN, TIBC, IRON, RETICCTPCT in the last 72 hours.  Urine analysis:    Component Value Date/Time   COLORURINE YELLOW 07/29/2024 1015   APPEARANCEUR CLOUDY (A) 07/29/2024 1015   LABSPEC 1.008 07/29/2024 1015   PHURINE 5.0 07/29/2024 1015   GLUCOSEU NEGATIVE 07/29/2024 1015   HGBUR SMALL (A) 07/29/2024 1015   BILIRUBINUR NEGATIVE 07/29/2024 1015   KETONESUR 5 (A) 07/29/2024 1015   PROTEINUR NEGATIVE 07/29/2024 1015   NITRITE NEGATIVE 07/29/2024 1015   LEUKOCYTESUR LARGE (A) 07/29/2024 1015    Sepsis Labs: Lactic Acid, Venous No results found for: LATICACIDVEN  MICROBIOLOGY: Recent Results (from the past 240 hours)  MRSA Next Gen by PCR, Nasal     Status: None   Collection Time: 07/28/24  7:02 PM   Specimen: Nasal Mucosa; Nasal Swab  Result Value Ref Range Status   MRSA by PCR Next Gen NOT DETECTED NOT DETECTED Final    Comment: (NOTE) The GeneXpert MRSA Assay (FDA approved for NASAL specimens only), is one component of a comprehensive MRSA colonization surveillance program. It is not intended to diagnose MRSA infection nor to guide or monitor treatment for MRSA infections. Test performance is not FDA approved in patients less than 47 years old. Performed at [facility], [address], [place], [place] [address]   Culture, blood (Routine X 2) w Reflex to ID Panel     Status: None   Collection Time: 07/28/24  8:25 PM   Specimen: BLOOD LEFT FOREARM  Result Value Ref Range Status   Specimen Description BLOOD LEFT FOREARM  Final   Special Requests   Final    BOTTLES DRAWN AEROBIC AND ANAEROBIC Blood Culture results may not be optimal due to an inadequate volume of blood received in culture bottles   Culture   Final    NO  GROWTH 5 DAYS Performed at [facility], [address], [place], [place] [address]    Report Status 08/02/2024 FINAL  Final  Culture, blood (Routine X 2) w Reflex to ID Panel     Status: None   Collection Time: 07/28/24  8:35 PM   Specimen: BLOOD  Result Value Ref Range Status   Specimen Description BLOOD LEFT ANTECUBITAL  Final   Special Requests   Final    BOTTLES DRAWN AEROBIC AND ANAEROBIC Blood Culture results may not be optimal due to an inadequate volume of blood received in culture bottles   Culture   Final    NO GROWTH 5 DAYS Performed at [facility], [address] [place], [place] [address]    Report Status 08/02/2024 FINAL  Final    RADIOLOGY STUDIES/RESULTS: No results found.   LOS: 7 days   Donalda Applebaum, MD  [facility]    To contact the attending provider between 7A-7P or the covering provider during after hours 7P-7A, please log into the web site [email or web address] and access using universal Watson password for that web site. If you do not have the password, please call the hospital operator.  08/04/2024, 10:27 AM

## 2024-08-04 NOTE — Progress Notes (Signed)
 Daily Progress Note   Patient Name: Danielle Vaughan       Date: 08/04/2024 DOB: [birth year]  Age: 89 y.o. MRN#: [id number] Attending Physician: Raenelle Donalda HERO, MD Primary Care Physician: [name], MD Admit Date: 07/28/2024 Length of Stay: 7 days  Reason for Consultation/Follow-up: Establishing goals of care  Subjective:   CC:I'm doing good  Subjective:  EMR reviewed including personal review of labs that are notable for sodium of 132 this morning.  Hemoglobin is 9.2.  And platelets are 672.  Reviewed most recent hospitalist, nephrology, and neurology notes.    I saw and examined Danielle Vaughan today.  At time of my encounter, her son, Lynwood, was at the bedside.  Her daughter also joined the conversation via phone.  She also had 2 sisters at the bedside as well.  We discussed multiple comorbidities including hyponatremia secondary to SIADH, encephalopathy, acute CVA, chronic heart failure, and A-fib/flutter.  We also reviewed that she has a new acute PE.  Patient was alert but remains a little confused and she asked me to review each of these in detail a second time.  Following this, we discussed that she is reaching a point of being stable enough to transition to skilled facility for rehab.  Family had many questions regarding choosing the rehab facility, what care is provided, and anticipated length of stay.  Discussed with them that CMS star ratings can be a guide for helping choose a facility.  Discussed plan for rehab and unknown length of stay as this would depend upon her progress.  Discussed with daughter that there was an attempt to complete HCPOA documentation today, however, patient was not able to answer questions consistently and effectively to show she has the capacity to do that at this time.  Discussed surrogate decision making and absence of HCPOA documentation.  We did touch on need for further discussion regarding potential limitations of care, but at this point  family is focused on transitioning to rehab to see much functional recovery she can make.    Review of Systems Denies complaints today, however, she is a poor historian  Objective:   Vital Signs:  BP 121/75 (BP Location: Left Arm)   Pulse 70   Temp 98.2 F (36.8 C) (Axillary)   Resp (!) 23   Ht 5' 1 (1.549 m)   Wt 53.6 kg   SpO2 96%   BMI 22.33 kg/m   Physical Exam: General: NAD, alert, frail Eyes: conjunctiva clear, anicteric sclera HENT: normocephalic, atraumatic, moist mucous membranes Cardiovascular: RRR Respiratory: no increased work of breathing noted, not in respiratory distress Abdomen: not distended Skin: no rashes or lesions on visible skin Neuro: Oriented to self and knows she is in the hospital, knows family at bedside, following commands easily  Imaging: @IMAGES @  I personally reviewed recent imaging.   Assessment & Plan:   Assessment:  Recommendations/Plan: # Complex medical decision making/goals of care:  - Reviewed complexity of medical condition and multiple comorbidities.  Discussed that these are progressive diseases and reviewed nutrition, cognition, and functional status as guide to how she is doing overall.  Reviewed attempt to complete HCPOA and why this was not able to be done based upon her mental status.  Plan remains for full code/full scope treatment and transition to rehab with eventual plan to return home.   Prognosis: Unable to determine  # Symptom management: Patient is receiving these palliative interventions for symptom management with an intent to improve quality of life.   -  Pain: Acetaminophen  as needed  - Constipation: MiraLAX as needed  # Psychosocial Support:  - Family, including 2 sons and daughter  # Discharge Planning: To Be Determined  -  Discussed with: Patient, son, daughter via phone, other family at bedside  Thank you for allowing the palliative care team to participate in the care Naomie JONETTA Muskrat.  Amaryllis Meissner, MD Palliative Care Provider PMT # [phone]  If patient remains symptomatic despite maximum doses, please call PMT at [phone] between 0700 and 1900. Outside of these hours, please call attending, as PMT does not have night coverage.   I personally spent a total of 50 minutes in the care of the patient today including preparing to see the patient, getting/reviewing separately obtained history, performing a medically appropriate exam/evaluation, counseling and educating, documenting clinical information in the EHR, and communicating results.

## 2024-08-04 NOTE — TOC Progression Note (Signed)
 Transition of Care [facility]) - Progression Note    Patient Details  Name: Danielle Vaughan MRN: [id number] Date of Birth: 03/13/1933  Transition of Care [facility]) CM/SW Contact  Inocente GORMAN Kindle, LCSW Phone Number: 08/04/2024, 12:30 PM  Clinical Narrative:    CSW went by patient's room but sister was at bedside. CSW contacted patient's daughter, Gaetana, and she stated they tried to tour [facility] last night but admissions was gone for the day. Her brother is touring [employer]. She stated they would have a decision for CSW tomorrow morning since patient is ready for discharge tomorrow. She requested [facility] for transport.    Expected Discharge Plan: Skilled Nursing Facility Barriers to Discharge: Continued Medical Work up, SNF Pending bed offer               Expected Discharge Plan and Services In-house Referral: Clinical Social Work   Post Acute Care Choice: Skilled Nursing Facility Living arrangements for the past 2 months: Single Family Home                                       Social Drivers of Health (SDOH) Interventions SDOH Screenings   Food Insecurity: No Food Insecurity (07/29/2024)  Housing: Low Risk  (07/29/2024)  Recent Concern: Housing - High Risk (07/29/2024)  Transportation Needs: No Transportation Needs (07/29/2024)  Utilities: Not At Risk (07/29/2024)  Alcohol Screen: Low Risk  (07/29/2024)  Depression (PHQ2-9): Low Risk  (07/29/2024)  Financial Resource Strain: Low Risk  (07/29/2024)  Physical Activity: Inactive (07/29/2024)  Social Connections: Moderately Integrated (07/29/2024)  Stress: No Stress Concern Present (07/29/2024)  Tobacco Use: Low Risk  (07/29/2024)  Health Literacy: Inadequate Health Literacy (07/29/2024)    Readmission Risk Interventions     No data to display

## 2024-08-04 NOTE — Plan of Care (Signed)
  Problem: Clinical Measurements: Goal: Diagnostic test results will improve Outcome: Progressing   Problem: Skin Integrity: Goal: Risk for impaired skin integrity will decrease Outcome: Progressing

## 2024-08-04 NOTE — Progress Notes (Signed)
 Patient refused dressing change and wound assessment on her sacrum.

## 2024-08-04 NOTE — Plan of Care (Signed)
  Problem: Clinical Measurements: Goal: Ability to maintain clinical measurements within normal limits will improve Outcome: Progressing   Problem: Activity: Goal: Risk for activity intolerance will decrease Outcome: Progressing   Problem: Nutrition: Goal: Adequate nutrition will be maintained Outcome: Progressing   Problem: Safety: Goal: Ability to remain free from injury will improve Outcome: Progressing   Problem: Education: Goal: Knowledge of disease or condition will improve Outcome: Progressing Goal: Knowledge of secondary prevention will improve (MUST DOCUMENT ALL) Outcome: Progressing Goal: Knowledge of patient specific risk factors will improve (DELETE if not current risk factor) Outcome: Progressing   Problem: Ischemic Stroke/TIA Tissue Perfusion: Goal: Complications of ischemic stroke/TIA will be minimized Outcome: Progressing

## 2024-08-05 DIAGNOSIS — I639 Cerebral infarction, unspecified: Secondary | ICD-10-CM | POA: Diagnosis not present

## 2024-08-05 DIAGNOSIS — K219 Gastro-esophageal reflux disease without esophagitis: Secondary | ICD-10-CM | POA: Diagnosis not present

## 2024-08-05 DIAGNOSIS — Z8673 Personal history of transient ischemic attack (TIA), and cerebral infarction without residual deficits: Secondary | ICD-10-CM | POA: Diagnosis present

## 2024-08-05 DIAGNOSIS — M6281 Muscle weakness (generalized): Secondary | ICD-10-CM | POA: Diagnosis not present

## 2024-08-05 DIAGNOSIS — R531 Weakness: Secondary | ICD-10-CM

## 2024-08-05 DIAGNOSIS — G9341 Metabolic encephalopathy: Secondary | ICD-10-CM | POA: Diagnosis not present

## 2024-08-05 DIAGNOSIS — L89310 Pressure ulcer of right buttock, unstageable: Secondary | ICD-10-CM | POA: Diagnosis not present

## 2024-08-05 DIAGNOSIS — I959 Hypotension, unspecified: Secondary | ICD-10-CM | POA: Diagnosis present

## 2024-08-05 DIAGNOSIS — I517 Cardiomegaly: Secondary | ICD-10-CM | POA: Diagnosis not present

## 2024-08-05 DIAGNOSIS — I2699 Other pulmonary embolism without acute cor pulmonale: Secondary | ICD-10-CM | POA: Diagnosis not present

## 2024-08-05 DIAGNOSIS — L89156 Pressure-induced deep tissue damage of sacral region: Secondary | ICD-10-CM | POA: Diagnosis not present

## 2024-08-05 DIAGNOSIS — L89323 Pressure ulcer of left buttock, stage 3: Secondary | ICD-10-CM | POA: Diagnosis not present

## 2024-08-05 DIAGNOSIS — Z8669 Personal history of other diseases of the nervous system and sense organs: Secondary | ICD-10-CM | POA: Diagnosis not present

## 2024-08-05 DIAGNOSIS — E871 Hypo-osmolality and hyponatremia: Secondary | ICD-10-CM | POA: Diagnosis not present

## 2024-08-05 DIAGNOSIS — F0390 Unspecified dementia without behavioral disturbance: Secondary | ICD-10-CM | POA: Diagnosis not present

## 2024-08-05 DIAGNOSIS — M8580 Other specified disorders of bone density and structure, unspecified site: Secondary | ICD-10-CM | POA: Diagnosis not present

## 2024-08-05 DIAGNOSIS — R2689 Other abnormalities of gait and mobility: Secondary | ICD-10-CM | POA: Diagnosis not present

## 2024-08-05 DIAGNOSIS — Z7401 Bed confinement status: Secondary | ICD-10-CM | POA: Diagnosis not present

## 2024-08-05 DIAGNOSIS — R1319 Other dysphagia: Secondary | ICD-10-CM | POA: Diagnosis not present

## 2024-08-05 DIAGNOSIS — R41841 Cognitive communication deficit: Secondary | ICD-10-CM | POA: Diagnosis not present

## 2024-08-05 DIAGNOSIS — D519 Vitamin B12 deficiency anemia, unspecified: Secondary | ICD-10-CM | POA: Diagnosis not present

## 2024-08-05 DIAGNOSIS — I10 Essential (primary) hypertension: Secondary | ICD-10-CM | POA: Diagnosis not present

## 2024-08-05 DIAGNOSIS — I480 Paroxysmal atrial fibrillation: Secondary | ICD-10-CM | POA: Diagnosis present

## 2024-08-05 DIAGNOSIS — I5020 Unspecified systolic (congestive) heart failure: Secondary | ICD-10-CM | POA: Diagnosis present

## 2024-08-05 DIAGNOSIS — R7303 Prediabetes: Secondary | ICD-10-CM | POA: Diagnosis not present

## 2024-08-05 DIAGNOSIS — I5022 Chronic systolic (congestive) heart failure: Secondary | ICD-10-CM | POA: Diagnosis not present

## 2024-08-05 DIAGNOSIS — N182 Chronic kidney disease, stage 2 (mild): Secondary | ICD-10-CM | POA: Diagnosis not present

## 2024-08-05 DIAGNOSIS — I493 Ventricular premature depolarization: Secondary | ICD-10-CM | POA: Diagnosis present

## 2024-08-05 DIAGNOSIS — E785 Hyperlipidemia, unspecified: Secondary | ICD-10-CM | POA: Diagnosis not present

## 2024-08-05 DIAGNOSIS — I6340 Cerebral infarction due to embolism of unspecified cerebral artery: Secondary | ICD-10-CM | POA: Diagnosis not present

## 2024-08-05 MED ORDER — ENSURE PLUS HIGH PROTEIN PO LIQD: 237.0000 mL | Freq: Two times a day (BID) | ORAL | Status: AC

## 2024-08-05 MED ORDER — SODIUM CHLORIDE 1 G PO TABS: 1.0000 g | ORAL_TABLET | Freq: Two times a day (BID) | ORAL | Status: AC

## 2024-08-05 MED ORDER — PANTOPRAZOLE SODIUM 40 MG PO TBEC: 40.0000 mg | DELAYED_RELEASE_TABLET | Freq: Every day | ORAL | Status: AC

## 2024-08-05 MED ORDER — PROSIGHT PO TABS: 1.0000 | ORAL_TABLET | Freq: Two times a day (BID) | ORAL | Status: AC

## 2024-08-05 MED ORDER — CYANOCOBALAMIN 1000 MCG/ML IJ SOLN: 1000.0000 ug | INTRAMUSCULAR | Status: AC

## 2024-08-05 MED ORDER — MIDODRINE HCL 10 MG PO TABS: 10.0000 mg | ORAL_TABLET | Freq: Three times a day (TID) | ORAL | Status: AC

## 2024-08-05 MED ORDER — METOPROLOL TARTRATE 25 MG PO TABS: 12.5000 mg | ORAL_TABLET | Freq: Two times a day (BID) | ORAL | Status: AC

## 2024-08-05 MED ORDER — POLYETHYLENE GLYCOL 3350 17 G PO PACK: 17.0000 g | PACK | Freq: Every day | ORAL | Status: AC | PRN

## 2024-08-05 MED ORDER — CYANOCOBALAMIN 1000 MCG/ML IJ SOLN: 1000.0000 ug | INTRAMUSCULAR | Status: DC

## 2024-08-05 MED ORDER — APIXABAN 5 MG PO TABS: 5.0000 mg | ORAL_TABLET | Freq: Two times a day (BID) | ORAL | Status: AC

## 2024-08-05 MED ORDER — CYANOCOBALAMIN 1000 MCG/ML IJ SOLN: 1000.0000 ug | Freq: Every day | INTRAMUSCULAR | Status: AC

## 2024-08-05 NOTE — Plan of Care (Signed)
  Problem: Education: Goal: Knowledge of General Education information will improve Description: Including pain rating scale, medication(s)/side effects and non-pharmacologic comfort measures Outcome: Progressing   Problem: Health Behavior/Discharge Planning: Goal: Ability to manage health-related needs will improve Outcome: Progressing   Problem: Clinical Measurements: Goal: Diagnostic test results will improve Outcome: Progressing   Problem: Activity: Goal: Risk for activity intolerance will decrease Outcome: Progressing   Problem: Education: Goal: Knowledge of disease or condition will improve Outcome: Progressing Goal: Knowledge of secondary prevention will improve (MUST DOCUMENT ALL) Outcome: Progressing Goal: Knowledge of patient specific risk factors will improve (DELETE if not current risk factor) Outcome: Progressing   Problem: Nutrition: Goal: Dietary intake will improve Outcome: Progressing

## 2024-08-05 NOTE — Plan of Care (Signed)
   Problem: Education: Goal: Knowledge of General Education information will improve Description: Including pain rating scale, medication(s)/side effects and non-pharmacologic comfort measures Outcome: Completed/Met   Problem: Health Behavior/Discharge Planning: Goal: Ability to manage health-related needs will improve Outcome: Completed/Met   Problem: Clinical Measurements: Goal: Ability to maintain clinical measurements within normal limits will improve Outcome: Completed/Met Goal: Will remain free from infection Outcome: Completed/Met Goal: Diagnostic test results will improve Outcome: Completed/Met Goal: Respiratory complications will improve Outcome: Completed/Met Goal: Cardiovascular complication will be avoided Outcome: Completed/Met   Problem: Activity: Goal: Risk for activity intolerance will decrease Outcome: Completed/Met   Problem: Nutrition: Goal: Adequate nutrition will be maintained Outcome: Completed/Met   Problem: Coping: Goal: Level of anxiety will decrease Outcome: Completed/Met   Problem: Elimination: Goal: Will not experience complications related to bowel motility Outcome: Completed/Met Goal: Will not experience complications related to urinary retention Outcome: Completed/Met   Problem: Pain Managment: Goal: General experience of comfort will improve and/or be controlled Outcome: Completed/Met   Problem: Safety: Goal: Ability to remain free from injury will improve Outcome: Completed/Met   Problem: Skin Integrity: Goal: Risk for impaired skin integrity will decrease Outcome: Completed/Met   Problem: Education: Goal: Knowledge of disease or condition will improve Outcome: Completed/Met Goal: Knowledge of secondary prevention will improve (MUST DOCUMENT ALL) Outcome: Completed/Met Goal: Knowledge of patient specific risk factors will improve (DELETE if not current risk factor) Outcome: Completed/Met   Problem: Ischemic Stroke/TIA Tissue  Perfusion: Goal: Complications of ischemic stroke/TIA will be minimized Outcome: Completed/Met   Problem: Coping: Goal: Will verbalize positive feelings about self Outcome: Completed/Met Goal: Will identify appropriate support needs Outcome: Completed/Met   Problem: Health Behavior/Discharge Planning: Goal: Ability to manage health-related needs will improve Outcome: Completed/Met Goal: Goals will be collaboratively established with patient/family Outcome: Completed/Met   Problem: Self-Care: Goal: Ability to participate in self-care as condition permits will improve Outcome: Completed/Met Goal: Verbalization of feelings and concerns over difficulty with self-care will improve Outcome: Completed/Met Goal: Ability to communicate needs accurately will improve Outcome: Completed/Met   Problem: Nutrition: Goal: Risk of aspiration will decrease Outcome: Completed/Met Goal: Dietary intake will improve Outcome: Completed/Met

## 2024-08-05 NOTE — Progress Notes (Signed)
 Report called to [facility] LPN at [facility].

## 2024-08-05 NOTE — Progress Notes (Signed)
 Physical Therapy Treatment Patient Details Name: Danielle Vaughan MRN: [id number] DOB: 29-Aug-1933 Today's Date: 08/05/2024   History of Present Illness 89 year old female adm 10/29 with stroke like symptoms. 10/29 Brain MRI suggested ischemia. 11/1 Brain MRI showed multifocal posterior circulation predominant infarcts. PMHx: stroke, hypertension, hyperlipidemia, pre-diabetes, CKD II, partial seizures, dementia with baseline orientation to self and place    PT Comments  Received pt semi-reclined in bed leaning strong to L. Pt remains confused and reported not feeling good but unable to elaborate. Pt required total A for bed mobility using bed features and unable to follow simple one step commands, even with hand over hand assist. Pt required max A fading to CGA for static sitting balance but noted L lateral and posterior lean. Attempted to stand in [place], however pt unable to motor plan/sequence or follow instructions. Pt with incontinent BM - returned to supine and rolled multiple times with +2 assist to perform peri-care and change bed linens. Continue to recommend therapy in less intense setting (<3 hours/day) to address current deficits.    If plan is discharge home, recommend the following: Two people to help with walking and/or transfers;Two people to help with bathing/dressing/bathroom;Direct supervision/assist for medications management;Direct supervision/assist for financial management;Assist for transportation;Help with stairs or ramp for entrance;Supervision due to cognitive status;Assistance with cooking/housework   Can travel by private vehicle     No  Equipment Recommendations  Hoyer lift;Hospital bed    Recommendations for Other Services       Precautions / Restrictions Precautions Precautions: Fall Recall of Precautions/Restrictions: Impaired Restrictions Weight Bearing Restrictions Per Provider Order: No     Mobility  Bed Mobility Overal bed mobility: Needs  Assistance Bed Mobility: Rolling, Supine to Sit, Sit to Supine Rolling: Total assist, Used rails   Supine to sit: Total assist, HOB elevated Sit to supine: Total assist   General bed mobility comments: pt unable to follow cues for sequencing despite increased time and hand over hand assist. Pt's visual impairments also limiting ability to participate. Pt required max A fading to CGA for static sitting balance. Noted significant L lateral lean with inability to correct.    Transfers Overall transfer level: Needs assistance                 General transfer comment: Attempted to stand from EOB in [place], however pt unable to follow simple one step commands to attend to task - therefore deferred standing due to safety concerns    Ambulation/Gait               General Gait Details: unable   Stairs             Wheelchair Mobility     Tilt Bed    Modified Rankin (Stroke Patients Only)       Balance Overall balance assessment: Needs assistance Sitting-balance support: Feet supported, Bilateral upper extremity supported Sitting balance-Leahy Scale: Poor Sitting balance - Comments: required max A fading to CGA for static sitting balance Postural control: Posterior lean, Left lateral lean     Standing balance comment: unable to stand on this date                            Communication Communication Communication: Impaired Factors Affecting Communication: Difficulty expressing self;Reduced clarity of speech;Hearing impaired (required ++ time)  Cognition Arousal: Alert Behavior During Therapy: Flat affect   PT - Cognitive impairments: History of cognitive impairments (baseline dementia)  Orientation impairments: Person                   PT - Cognition Comments: extremly confused A&Ox1 Following commands: Impaired Following commands impaired: Follows one step commands inconsistently    Cueing Cueing Techniques: Verbal cues,  Gestural cues, Tactile cues, Visual cues  Exercises      General Comments General comments (skin integrity, edema, etc.): pt remains extremly confused      Pertinent Vitals/Pain Pain Assessment Pain Assessment: Faces Faces Pain Scale: Hurts even more Pain Location: legs with movement Pain Descriptors / Indicators: Discomfort, Grimacing, Guarding, Moaning, Crying Pain Intervention(s): Limited activity within patient's tolerance, Monitored during session, Repositioned    Home Living                          Prior Function            PT Goals (current goals can now be found in the care plan section) Acute Rehab PT Goals Patient Stated Goal: Feel better PT Goal Formulation: Patient unable to participate in goal setting Time For Goal Achievement: 08/13/24 Potential to Achieve Goals: Fair Progress towards PT goals: Progressing toward goals    Frequency    Min 2X/week      PT Plan      Co-evaluation              AM-PAC PT 6 Clicks Mobility   Outcome Measure  Help needed turning from your back to your side while in a flat bed without using bedrails?: Total Help needed moving from lying on your back to sitting on the side of a flat bed without using bedrails?: Total Help needed moving to and from a bed to a chair (including a wheelchair)?: Total Help needed standing up from a chair using your arms (e.g., wheelchair or bedside chair)?: Total Help needed to walk in hospital room?: Total Help needed climbing 3-5 steps with a railing? : Total 6 Click Score: 6    End of Session Equipment Utilized During Treatment: Gait belt Activity Tolerance: Other (comment) (limited by incontinent BM) Patient left: with call bell/phone within reach;in bed;with bed alarm set Nurse Communication: Mobility status;Need for lift equipment PT Visit Diagnosis: Other abnormalities of gait and mobility (R26.89)     Time: 8952-8883 PT Time Calculation (min) (ACUTE ONLY): 29  min  Charges:    $Therapeutic Activity: 23-37 mins PT General Charges $$ ACUTE PT VISIT: 1 Visit                     Therisa Stains PT, DPT Therisa HERO Zaunegger 08/05/2024, 12:19 PM

## 2024-08-05 NOTE — TOC Transition Note (Signed)
 Transition of Care [facility]) - Discharge Note   Patient Details  Name: Danielle Vaughan MRN: [id number] Date of Birth: 04-01-1933  Transition [facility]) CM/SW Contact:  Inocente GORMAN Kindle, LCSW Phone Number: 08/05/2024, 1:17 PM   Clinical Narrative:    Patient will DC to: [facility] Anticipated DC date: 08/05/24 Family notified: Daughter, Animal Nutritionist by: [place]   Per MD patient ready for DC to [facility]. RN to call report prior to discharge [phone] room 603a). RN, patient, patient's family, and facility notified of DC. Discharge Summary and FL2 sent to facility. DC packet on chart. Ambulance transport requested for patient.   CSW will sign off for now as social work intervention is no longer needed. Please consult us  again if new needs arise.     Final next level of care: Skilled Nursing Facility Barriers to Discharge: Barriers Resolved   Patient Goals and CMS Choice Patient states their goals for this hospitalization and ongoing recovery are:: Rehab CMS Medicare.gov Compare Post Acute Care list provided to:: Patient Represenative (must comment) Choice offered to / list presented to : Adult Children [place] ownership interest in [facility].provided to:: Adult Children    Discharge Placement   Existing PASRR number confirmed : 08/05/24          Patient chooses bed at: [facility] Patient to be transferred to facility by: [facility] Name of family member notified: Daughter Patient and family notified of of transfer: 08/05/24  Discharge Plan and Services Additional resources added to the After Visit Summary for   In-house Referral: Clinical Social Work   Post Acute Care Choice: Skilled Nursing Facility                               Social Drivers of Health (SDOH) Interventions SDOH Screenings   Food Insecurity: No Food Insecurity (07/29/2024)  Housing: Low Risk  (07/29/2024)  Recent Concern: Housing - High Risk (07/29/2024)  Transportation  Needs: No Transportation Needs (07/29/2024)  Utilities: Not At Risk (07/29/2024)  Alcohol Screen: Low Risk  (07/29/2024)  Depression (PHQ2-9): Low Risk  (07/29/2024)  Financial Resource Strain: Low Risk  (07/29/2024)  Physical Activity: Inactive (07/29/2024)  Social Connections: Moderately Integrated (07/29/2024)  Stress: No Stress Concern Present (07/29/2024)  Tobacco Use: Low Risk  (07/29/2024)  Health Literacy: Inadequate Health Literacy (07/29/2024)     Readmission Risk Interventions     No data to display

## 2024-08-05 NOTE — Discharge Summary (Signed)
 PATIENT DETAILS Name: Danielle Vaughan Age: 89 y.o. Sex: female Date of Birth: 12-26-1932 MRN: [id number]. Admitting Physician: Terry LOISE Hurst, DO ERE:Mjfjryjwimjw, Lequita, MD  Admit Date: 07/28/2024 Discharge date: 08/05/2024  Recommendations for Outpatient Follow-up:  Follow up with PCP in 1-2 weeks Please obtain CMP/CBC in one week Please ensure follow up with neurology Please ensure follow-up with cardiology-see below.  Admitted From:  Home  Disposition: Skilled nursing facility   Discharge Condition: good  CODE STATUS:   Code Status: Full Code   Diet recommendation:  Diet Order             Diet - low sodium heart healthy           DIET DYS 3 Room service appropriate? No; Fluid consistency: Thin; Fluid restriction: 1200 mL Fluid  Diet effective now                    Brief Summary: Patient is a 89 y.o.  female with history of CVA, HTN, HLD, partial seizures, dementia-who presented to the hospital with strokelike symptoms-upon further evaluation-patient was found to have hyponatremia, acute embolic CVA, and acute pulmonary embolism.   Significant events: 10/29>> admit to ICU by PCCM 10/31>> transferred to [facility]   Significant studies: 10/29>> CT head: No acute intracranial abnormality 10/29>> MRI brain: Scattered foci of diffusion signal abnormality in the posterior frontal/parietal/occipital lobe. 10/29>> CT C-spine: No acute abnormality. 10/30>> A1c: 5.3 11/01>> MRI brain: Resolving diffusion abnormalities-posterior hemisphere with no new ischemic lesions. 11/02>> CT angio head/neck: No LVO-right carotid bulb stenosis-55%, left carotid bulb stenosis-50%. 11/02>> CTA chest: Subsegmental pulmonary embolus right upper lobe. 11/02>> echo: EF 40-45%, numerous wall motion abnormalities. 11/02>> LDL: 21   Significant microbiology data: 10/29>> blood culture: No growth   Procedures: None   Consults: PCCM Neurology  Brief Hospital  Course: Hyponatremia Suspicion for SIADH Required 3% saline while in the ICU Currently sodium levels have stabilized with salt tablets and fluid restriction. Repeat BMET in 1 week.   Acute metabolic encephalopathy Secondary to hyponatremia/UTI Clinically improved-family at bedside-answering questions appropriately this morning.  Continue to maintain delirium precautions-has history of dementia.   Acute CVA Presumed embolic Workup as above No major focal deficits-has generalized weakness SNF planned on discharge. Continue Eliquis.   Acute pulmonary embolism Hemodynamics stable-on room air On Eliquis   Paroxysmal atrial flutter  Paroxysmal SVT Maintaining sinus rhythm Continue Eliquis/beta-blocker Follow-up in A-fib clinic.   Newly diagnosed-chronic HFrEF EF slightly reduced on echo-numerous wall motion abnormalities This is incidental-volume status is stable Here with CVA/PE-suspect further workup-including cardiology evaluation can be deferred to the outpatient setting.  Given advanced agedementia-she may not be a candidate for aggressive care in any event.   Complicated UTI Unfortunately urine culture was never sent-but UA was grossly abnormal Treated with Rocephin x 3 days. Currently stable off antibiotics.  Vitamin B12 deficiency Continue supplementation-recheck levels in 6 weeks.   Hypotension BP stable on midodrine.   History of hypertension BP stable On midodrine given CVA On beta-blocker-mostly for SVT/atrial flutter.   Left hand swelling Resolved-x-ray negative.   History of dementia Maintain delirium precautions Previously tried on Namenda-this was stopped due to dizzy spells   Pressure Ulcer: Agree with assessment as outlined below. Wound 07/28/24 2200 Pressure Injury Sacrum Medial;Right;Left Deep Tissue Pressure Injury - Purple or maroon localized area of discolored intact skin or blood-filled blister due to damage of underlying soft tissue from  pressure and/or shear. (Active)    Discharge  Diagnoses:  Principal Problem:   Acute hyponatremia   Discharge Instructions:  Activity:  As tolerated with Full fall precautions use walker/cane & assistance as needed  Discharge Instructions     Ambulatory referral to Neurology   Complete by: As directed    Call MD for:  persistant nausea and vomiting   Complete by: As directed    Call MD for:  severe uncontrolled pain   Complete by: As directed    Diet - low sodium heart healthy   Complete by: As directed    Discharge instructions   Complete by: As directed    Follow with Primary MD  [name], MD in 1-2 weeks  Please get a complete blood count and chemistry panel checked by your Primary MD at your next visit, and again as instructed by your Primary MD.  Get Medicines reviewed and adjusted: Please take all your medications with you for your next visit with your Primary MD  Laboratory/radiological data: Please request your Primary MD to go over all hospital tests and procedure/radiological results at the follow up, please ask your Primary MD to get all Hospital records sent to his/her office.  In some cases, they will be blood work, cultures and biopsy results pending at the time of your discharge. Please request that your primary care M.D. follows up on these results.  Also Note the following: If you experience worsening of your admission symptoms, develop shortness of breath, life threatening emergency, suicidal or homicidal thoughts you must seek medical attention immediately by calling 911 or calling your MD immediately  if symptoms less severe.  You must read complete instructions/literature along with all the possible adverse reactions/side effects for all the Medicines you take and that have been prescribed to you. Take any new Medicines after you have completely understood and accpet all the possible adverse reactions/side effects.   Do not drive when taking  Pain medications or sleeping medications (Benzodaizepines)  Do not take more than prescribed Pain, Sleep and Anxiety Medications. It is not advisable to combine anxiety,sleep and pain medications without talking with your primary care practitioner  Special Instructions: If you have smoked or chewed Tobacco  in the last 2 yrs please stop smoking, stop any regular Alcohol  and or any Recreational drug use.  Wear Seat belts while driving.  Please note: You were cared for by a hospitalist during your hospital stay. Once you are discharged, your primary care physician will handle any further medical issues. Please note that NO REFILLS for any discharge medications will be authorized once you are discharged, as it is imperative that you return to your primary care physician (or establish a relationship with a primary care physician if you do not have one) for your post hospital discharge needs so that they can reassess your need for medications and monitor your lab values.   Increase activity slowly   Complete by: As directed    No wound care   Complete by: As directed       Allergies as of 08/05/2024       Reactions   Cefoxitin    Tolerates Ceftriaxone 07/29/2024   Ceftin [cefuroxime Axetil] Other (See Comments)   Seeing lights Tolerates Ceftriaxone 07/29/2024   Codeine Other (See Comments)   Unknown        Medication List     STOP taking these medications    amLODipine 5 MG tablet Commonly known as: NORVASC   benazepril 40 MG tablet Commonly known as: LOTENSIN  fluconazole 150 MG tablet Commonly known as: DIFLUCAN   ibuprofen 200 MG tablet Commonly known as: ADVIL   metoprolol succinate 100 MG 24 hr tablet Commonly known as: TOPROL-XL   SALONPAS EX   SYSTANE FREE OP       TAKE these medications    acetaminophen  500 MG tablet Commonly known as: TYLENOL  Take 500-1,000 mg by mouth every 6 (six) hours as needed for moderate pain (pain score 4-6).   apixaban 5  MG Tabs tablet Commonly known as: ELIQUIS Take 1 tablet (5 mg total) by mouth 2 (two) times daily.   BIOFREEZE EX Apply 1 Application topically as needed (Pain). What changed: Another medication with the same name was removed. Continue taking this medication, and follow the directions you see here.   cyanocobalamin  1000 MCG/ML injection Commonly known as: VITAMIN B12 Inject 1 mL (1,000 mcg total) into the skin daily for 1 day.   cyanocobalamin  1000 MCG/ML injection Commonly known as: VITAMIN B12 Inject 1 mL (1,000 mcg total) into the skin every 30 (thirty) days.   feeding supplement Liqd Take 237 mLs by mouth 2 (two) times daily between meals.   ketoconazole 2 % cream Commonly known as: NIZORAL Apply 1 Application topically 2 (two) times daily.   metoprolol tartrate 25 MG tablet Commonly known as: LOPRESSOR Take 0.5 tablets (12.5 mg total) by mouth 2 (two) times daily.   midodrine 10 MG tablet Commonly known as: PROAMATINE Take 1 tablet (10 mg total) by mouth 3 (three) times daily with meals.   multivitamin Tabs tablet Take 1 tablet by mouth 2 (two) times daily.   pantoprazole 40 MG tablet Commonly known as: PROTONIX Take 1 tablet (40 mg total) by mouth daily.   polyethylene glycol 17 g packet Commonly known as: MIRALAX / GLYCOLAX Take 17 g by mouth daily as needed for moderate constipation.   sodium chloride 1 g tablet Take 1 tablet (1 g total) by mouth 2 (two) times daily with a meal.        Follow-up Information     [name], MD. Schedule an appointment as soon as possible for a visit in 1 week(s).   Specialty: Internal Medicine Contact information: [address] [place] [address] [phone]         [facility] Follow up.   Specialty: Neurology Why: Office will call with date/time, If you dont hear from them,please give them a call Contact information: [address] [place]  [address] [phone]        Lonni Slain, MD. Schedule an appointment as soon as possible for a visit in 2 day(s).   Specialty: Cardiology Contact information: [address] [place] [address] [phone]                Allergies  Allergen Reactions   Cefoxitin     Tolerates Ceftriaxone 07/29/2024   Ceftin [Cefuroxime Axetil] Other (See Comments)    Seeing lights Tolerates Ceftriaxone 07/29/2024    Codeine Other (See Comments)    Unknown     Other Procedures/Studies: CT Angio Chest Pulmonary Embolism (PE) W or WO Contrast Result Date: 08/02/2024 EXAM: CTA of the Chest with contrast for PE 08/02/2024 12:10:16 AM TECHNIQUE: CTA of the chest was performed after the administration of intravenous contrast. Multiplanar reformatted images are provided for review. MIP images are provided for review. Automated exposure control, iterative reconstruction, and/or weight based adjustment of the mA/kV was utilized to reduce the  radiation dose to as low as reasonably achievable. COMPARISON: AP and lateral chest 11/23/2022, and same day CT angiography (CTA) of the head and neck images. CLINICAL HISTORY: FINDINGS: PULMONARY ARTERIES: Pulmonary arteries are adequately opacified for evaluation. There is a subsegmental branch point arterial embolus in the right upper lobe on series 6 axial images 40 through 42 corresponding to the finding noted on the CTA head and neck. No further arterial emboli are identified. The pulmonary arteries and veins are normal caliber. Main pulmonary artery is normal in caliber. MEDIASTINUM: Mild cardiomegaly with left chamber predominance and no evidence of acute right heart strain. There is a small amount of scattered single vessel calcification in the proximal LAD. Mild atherosclerosis and tortuosity of the aorta and great vessels without aneurysm, stenosis, or dissection. There is a 2-vessel aortic arch with a normal  variant brachiocephalic trunk. LYMPH NODES: No mediastinal, hilar or axillary lymphadenopathy. LUNGS AND PLEURA: There is a posterior right diaphragmatic fat herniation into the lower chest. There is mild scattered subpleural reticulation in the upper lobes, mild posterior passive atelectasis in both lungs. There is no consolidation, effusion, pneumothorax, or suspicious lung nodule. UPPER ABDOMEN: There are Bosniak 1 cysts in the upper pole of the kidneys, with a 3 cm cyst measuring 18 Hounsfield units on series 6 axial 127 left upper pole, additional cysts in the right upper pole largest is 2 cm and 20 Hounsfield units. No follow-up imaging is recommended. No other significant upper abdominal findings. SOFT TISSUES AND BONES: There is osteopenia and advanced degenerative change of the thoracic spine. There are mild chronic wedge compression deformities of the T12 and L1 vertebral bodies, also seen on a lumbar spine CT from 09/22/2023. Slight thoracic kyphodextroscoliosis. No acute soft tissue abnormality. IMPRESSION: 1. Subsegmental pulmonary embolus in the right upper lobe, with no additional pulmonary emboli identified. 2. Mild cardiomegaly without evidence of acute right heart strain. 3. Mild chronic changes in the upper lobes . No acute chest CT or CTA findings . 4. Advanced degenerative changes of the thoracic spine. Electronically signed by: Francis Quam MD 08/02/2024 12:54 AM EST RP Workstation: [id number]   CT ANGIO HEAD NECK W WO CM Result Date: 08/01/2024 CLINICAL DATA:  Follow-up examination for stroke. EXAM: CT ANGIOGRAPHY HEAD AND NECK WITH AND WITHOUT CONTRAST TECHNIQUE: Multidetector CT imaging of the head and neck was performed using the standard protocol during bolus administration of intravenous contrast. Multiplanar CT image reconstructions and MIPs were obtained to evaluate the vascular anatomy. Carotid stenosis measurements (when applicable) are obtained utilizing NASCET criteria, using  the distal internal carotid diameter as the denominator. RADIATION DOSE REDUCTION: This exam was performed according to the departmental dose-optimization program which includes automated exposure control, adjustment of the mA and/or kV according to patient size and/or use of iterative reconstruction technique. CONTRAST:  75mL OMNIPAQUE IOHEXOL 350 MG/ML SOLN COMPARISON:  Comparison made with MRI from 07/31/2024 as well as earlier studies. FINDINGS: CT HEAD FINDINGS Brain: Atrophy with advanced chronic microvascular ischemic disease again noted. Scattered areas of chronic cortical mineralization involving the right greater than left parieto-occipital regions, with additional involvement of the perirolandic areas and mesial temporal lobes, stable from prior. Previously noted superimposed ischemic changes not well visualized by CT. No other acute large vessel territory infarct. No acute intracranial hemorrhage. No mass lesion or midline shift. No hydrocephalus or extra-axial fluid collection. Vascular: No abnormal hyperdense vessel. Calcified atherosclerosis present at the skull base. Skull: Scalp soft tissues and calvarium demonstrate no new finding.  Sinuses/Orbits: Globes orbital soft tissues within normal limits. Right maxillary sinus retention cyst noted. Minimal layering secretions noted within the left sphenoid sinus. No mastoid effusion. Other: None. Review of the MIP images confirms the above findings CTA NECK FINDINGS Aortic arch: Visualized aortic arch within normal limits for caliber. Bovine branching pattern noted. A atheromatous change present about the aortic arch. No high-grade stenosis about the origin the great vessels. Right carotid system: Right common and internal carotid arteries are tortuous. No dissection. Bulky calcified plaque about the right carotid bulb with associated stenosis of up to 55% by NASCET criteria. Left carotid system: Left common and internal carotid arteries are tortuous. No  dissection. Moderate calcified plaque about the left carotid bulb without hemodynamically significant greater than 50% stenosis. Vertebral arteries: Both vertebral arteries arise from subclavian arteries. Vertebral arteries are tortuous bilaterally. No stenosis or dissection. Skeleton: No worrisome osseous lesions. Advanced multilevel cervical spondylosis without high-grade spinal stenosis. Other neck: No other acute finding. Upper chest: There is a questionable filling defect within a pulmonary arterial vessel at the right upper lobe (series 10, image 297). While this could be related to timing the contrast bolus, a possible small acute pulmonary embolism is difficult to exclude. No other acute finding. Review of the MIP images confirms the above findings CTA HEAD FINDINGS Anterior circulation: Vermis atheromatous change about the carotid siphons without hemodynamically significant stenosis. A1 segments patent bilaterally. Normal anterior communicating artery complex. Anterior cerebral arteries patent without significant stenosis. No M1 stenosis or occlusion. No proximal MCA branch occlusion or high-grade stenosis. Distal MCA branches perfused and symmetric. Posterior circulation: Both V4 segments widely patent without stenosis. Left PICA patent at its origin. Right PICA not well seen. Basilar patent without stenosis. Superior cerebral arteries patent bilaterally. Left PCA supplied via the basilar as well as a prominent left posterior communicating artery. Predominant fetal type origin of the right PCA supplied via the right PCOM, although note is made of a probable occluded hypoplastic right P1 segment (series 10, image 102). Right PCA widely patent distally. Venous sinuses: Not well assessed due to arterial timing the contrast bolus. Anatomic variants: As above.  No aneurysm. Review of the MIP images confirms the above findings IMPRESSION: CT HEAD: 1. No acute intracranial abnormality. 2. Atrophy with advanced  chronic microvascular ischemic disease. Scattered areas of chronic cortical mineralization involving the right greater than left parieto-occipital regions, with additional involvement of the perirolandic areas and mesial temporal lobes, stable from prior. Previously noted superimposed ischemic changes not well visualized by CT. CTA HEAD AND NECK: 1. Negative CTA for large vessel occlusion or other emergent finding. 2. Bulky calcified plaque about the right carotid bulb with associated stenosis of up to 55% by NASCET criteria. 3. Moderate calcified plaque about the left carotid bulb without hemodynamically significant greater than 50% stenosis. 4. Occluded right P1 segment. There is a robust and widely patent right posterior communicating artery, with wide patency of the right PCA distally. 5. Questionable filling defect within a pulmonary arterial vessel at the right upper lobe. While this could be related to timing the contrast bolus with mixing artifact, a possible small acute pulmonary embolism is difficult to exclude. Further evaluation with dedicated CTA of the chest recommended. 6.  Aortic Atherosclerosis (ICD10-I70.0). These results were communicated to Dr. Michaela at 9:40 pm on 08/01/2024 by text page via the [facility] messaging system. Electronically Signed   By: Morene Hoard M.D.   On: 08/01/2024 21:44   DG Hand 2 View  Left Result Date: 08/01/2024 CLINICAL DATA:  Hand pain EXAM: LEFT HAND - 2 VIEW COMPARISON:  None Available. FINDINGS: There is no evidence of fracture or dislocation. Diffuse interphalangeal joint space narrowing, most pronounced at the index and middle finger distal interphalangeal and thumb carpometacarpal joints. Soft tissues are unremarkable. IMPRESSION: 1. No acute fracture or dislocation. 2. Osteoarthritis. Electronically Signed   By: Limin  Xu M.D.   On: 08/01/2024 13:23   ECHOCARDIOGRAM COMPLETE Result Date: 08/01/2024    ECHOCARDIOGRAM REPORT   Patient Name:   MARGIE URBANOWICZ Date of Exam: 08/01/2024 Medical Rec #:  [id number]        Height:       61.0 in Accession #:    [id number]       Weight:       120.1 lb Date of Birth:  [birth year]        BSA:          1.521 m Patient Age:    89 years         BP:           108/69 mmHg Patient Gender: F                HR:           87 bpm. Exam Location:  Inpatient Procedure: 2D Echo, 3D Echo, Cardiac Doppler and Color Doppler (Both Spectral            and Color Flow Doppler were utilized during procedure). Indications:    Stroke  History:        Patient has prior history of Echocardiogram examinations, most                 recent 12/28/2019.  Sonographer:    Philomena Daring Referring Phys: ANIUS.BANISTER MCNEILL P KIRKPATRICK  Sonographer Comments: Image acquisition challenging due to respiratory motion. IMPRESSIONS  1. Left ventricular ejection fraction, by estimation, is 40 to 45%. Left ventricular ejection fraction by 3D volume is 41 %. The left ventricle has mildly decreased function. The left ventricle demonstrates regional wall motion abnormalities (see scoring diagram/findings for description). There is mild left ventricular hypertrophy of the basal-septal segment. Left ventricular diastolic parameters are consistent with Grade I diastolic dysfunction (impaired relaxation).  2. Right ventricular systolic function is normal. The right ventricular size is normal. There is mildly elevated pulmonary artery systolic pressure. The estimated right ventricular systolic pressure is 45.0 mmHg.  3. The mitral valve is normal in structure. Trivial mitral valve regurgitation. No evidence of mitral stenosis.  4. Tricuspid valve regurgitation is moderate.  5. The aortic valve is normal in structure. Aortic valve regurgitation is not visualized. No aortic stenosis is present.  6. The inferior vena cava is normal in size with greater than 50% respiratory variability, suggesting right atrial pressure of 3 mmHg. Conclusion(s)/Recommendation(s): No intracardiac source  of embolism detected on this transthoracic study. Consider a transesophageal echocardiogram to exclude cardiac source of embolism if clinically indicated. FINDINGS  Left Ventricle: Left ventricular ejection fraction, by estimation, is 40 to 45%. Left ventricular ejection fraction by 3D volume is 41 %. The left ventricle has mildly decreased function. The left ventricle demonstrates regional wall motion abnormalities. The left ventricular internal cavity size was normal in size. There is mild left ventricular hypertrophy of the basal-septal segment. Left ventricular diastolic parameters are consistent with Grade I diastolic dysfunction (impaired relaxation).  LV Wall Scoring: The inferior wall, mid anteroseptal segment, basal inferolateral segment, mid inferoseptal  segment, and basal inferoseptal segment are akinetic. Right Ventricle: The right ventricular size is normal. No increase in right ventricular wall thickness. Right ventricular systolic function is normal. There is mildly elevated pulmonary artery systolic pressure. The tricuspid regurgitant velocity is 3.04  m/s, and with an assumed right atrial pressure of 8 mmHg, the estimated right ventricular systolic pressure is 45.0 mmHg. Left Atrium: Left atrial size was normal in size. Right Atrium: Right atrial size was normal in size. Pericardium: There is no evidence of pericardial effusion. Mitral Valve: The mitral valve is normal in structure. Trivial mitral valve regurgitation. No evidence of mitral valve stenosis. Tricuspid Valve: The tricuspid valve is normal in structure. Tricuspid valve regurgitation is moderate . No evidence of tricuspid stenosis. Aortic Valve: The aortic valve is normal in structure. Aortic valve regurgitation is not visualized. No aortic stenosis is present. Pulmonic Valve: The pulmonic valve was normal in structure. Pulmonic valve regurgitation is not visualized. No evidence of pulmonic stenosis. Aorta: The aortic root is normal in  size and structure. Venous: The inferior vena cava is normal in size with greater than 50% respiratory variability, suggesting right atrial pressure of 3 mmHg. IAS/Shunts: No atrial level shunt detected by color flow Doppler.  LEFT VENTRICLE PLAX 2D LVIDd:         3.40 cm LVIDs:         2.70 cm LV PW:         1.10 cm         3D Volume EF LV IVS:        1.20 cm         LV 3D EF:    Left LVOT diam:     2.00 cm                      ventricul LV SV:         52                           ar LV SV Index:   34                           ejection LVOT Area:     3.14 cm                     fraction                                             by 3D                                             volume is                                             41 %.                                 3D Volume EF:  3D EF:        41 %                                LV EDV:       125 ml                                LV ESV:       74 ml                                LV SV:        51 ml RIGHT VENTRICLE             IVC RV Basal diam:  3.30 cm     IVC diam: 2.10 cm RV Mid diam:    2.60 cm RV S prime:     17.40 cm/s TAPSE (M-mode): 2.3 cm LEFT ATRIUM             Index        RIGHT ATRIUM           Index LA diam:        2.70 cm 1.78 cm/m   RA Area:     11.20 cm LA Vol (A2C):   42.5 ml 27.94 ml/m  RA Volume:   23.40 ml  15.38 ml/m LA Vol (A4C):   38.8 ml 25.51 ml/m LA Biplane Vol: 41.1 ml 27.02 ml/m  AORTIC VALVE LVOT Vmax:   115.00 cm/s LVOT Vmean:  76.700 cm/s LVOT VTI:    0.164 m  AORTA Ao Root diam: 2.50 cm Ao Asc diam:  3.10 cm TRICUSPID VALVE TR Peak grad:   37.0 mmHg TR Vmax:        304.00 cm/s  SHUNTS Systemic VTI:  0.16 m Systemic Diam: 2.00 cm Oneil Parchment MD Electronically signed by Oneil Parchment MD Signature Date/Time: 08/01/2024/1:11:32 PM    Final    MR BRAIN WO CONTRAST Result Date: 07/31/2024 EXAM: MRI BRAIN WITHOUT CONTRAST 07/31/2024 05:10:47 PM TECHNIQUE: Multiplanar multisequence MRI of the  head/brain was performed without the administration of intravenous contrast. COMPARISON: 07/28/2024 CLINICAL HISTORY: Patient with left hand weakness, left tongue deviation, concern of acute CVA, aware of recent MRI, but was poor quality, currently patient is more cooperative. FINDINGS: BRAIN AND VENTRICLES: Resolving diffusion abnormalities within the posterior hemispheres. No new ischemic lesions. Old right occipital infarct. Early confluent hyperintense T2-weighted signal within the cerebral white matter, most commonly due to chronic small vessel disease. No intracranial hemorrhage. No mass. No midline shift. No hydrocephalus. The sella is unremarkable. Normal flow voids. ORBITS: Ocular lens replacements. No acute abnormality. SINUSES AND MASTOIDS: No acute abnormality. BONES AND SOFT TISSUES: Normal marrow signal. No acute soft tissue abnormality. IMPRESSION: 1. Resolving diffusion abnormalities within the posterior hemispheres with no new ischemic lesions. 2. Early confluent T2 hyperintense signal within the cerebral white matter, most consistent with chronic small vessel disease. Electronically signed by: Franky Stanford MD 07/31/2024 05:38 PM EDT RP Workstation: [id number]   CT CERVICAL SPINE WO CONTRAST Result Date: 07/28/2024 EXAM: CT CERVICAL SPINE WITHOUT CONTRAST 07/28/2024 11:20:10 PM TECHNIQUE: CT of the cervical spine was performed without the administration of intravenous contrast. Multiplanar reformatted images are provided for review. Automated exposure control, iterative reconstruction, and/or weight based adjustment of the mA/kV was  utilized to reduce the radiation dose to as low as reasonably achievable. COMPARISON: 12/12/2023 CLINICAL HISTORY: Cervical radiculopathy, infection suspected, no prior imaging; fall. FINDINGS: CERVICAL SPINE: BONES AND ALIGNMENT: No acute fracture or traumatic malalignment. DEGENERATIVE CHANGES: Diffuse multilevel degenerative disc and facet disease. SOFT TISSUES:  No prevertebral soft tissue swelling. IMPRESSION: 1. No acute abnormality of the cervical spine. 2. Diffuse multilevel degenerative disc and facet disease. Electronically signed by: Franky Crease MD 07/28/2024 11:27 PM EDT RP Workstation: [id number]   MR BRAIN WO CONTRAST Result Date: 07/28/2024 EXAM: MRI BRAIN WITHOUT CONTRAST 07/28/2024 09:15:27 PM TECHNIQUE: Multiplanar multisequence MRI of the head/brain was performed without the administration of intravenous contrast. Examination severely limited as the patient was unable to tolerate the full length of the exam. Additionally, provided images are severely degraded by motion artifact. COMPARISON: Comparison made with CT from earlier the same day as well as previous MRI from 08/03/2023. CLINICAL HISTORY: FINDINGS: BRAIN AND VENTRICLES: Examination severely limited as the patient was unable to tolerate the full length of the exam. Additionally, provided images are severely degraded by motion artifact. Age related subluxation. Moderate to advanced chronic microvascular ischemic disease. Chronic encephalomalacia at the right occipital lobe, grossly similar. There are scattered foci of diffusion signal abnormality involving the posterior frontal, parietal, and occipital lobes (series 2, image 43, 39, 35, 30, 25, 19). While these findings are suspected to at least in part be artifactual in nature due to the underlying chronic encephalomalacia and mineralization at these sites, possible superimposed ischemic infarcts could be present as well. No visible associated hemorrhage or mass effect. No other obvious acute intracranial abnormality. No hydrocephalus. No extravasated fluid collection. No visible mass lesion. No midline shift. The sella is unremarkable. Major intracranial vascular flow voids are not well assessed on this limited exam. Craniocervical junction not well assessed on this limited exam. ORBITS: Globes and orbital soft tissues not well assessed on this  limited exam. SINUSES AND MASTOIDS: Right maxillary sinus retention cyst. Paranasal sinuses are otherwise grossly clear. BONES AND SOFT TISSUES: Normal marrow signal. No acute soft tissue abnormality. IMPRESSION: 1. Severely limited examination due to patient intolerance and motion artifact. 2. Scattered foci of diffusion signal abnormality in the posterior frontal, parietal, and occipital lobes as above. While these findings likely at least in partartifactual in nature from he underlying chronic hanges and mineralization n these regions possible superimposed ischemic infarcts ay be present as well. No visible associated hemorrhage or mass effect. 3. Underlying atrophy with chronic microvascular ischemic disease, with chronic right occipital encephalomalacia. Electronically signed by: Morene Hoard MD 07/28/2024 10:47 PM EDT RP Workstation: [id number]   CT HEAD WO CONTRAST Result Date: 07/28/2024 EXAM: CT HEAD WITHOUT CONTRAST 07/28/2024 03:42:35 PM TECHNIQUE: CT of the head was performed without the administration of intravenous contrast. Automated exposure control, iterative reconstruction, and/or weight based adjustment of the mA/kV was utilized to reduce the radiation dose to as low as reasonably achievable. COMPARISON: CT head 12/12/2023. CLINICAL HISTORY: Neuro deficit, acute, stroke suspected; fall. Pt BIB [facility] coming from doctor's office called out for stroke like symptoms. AMS at baseline. Family says she had a fall 9 days ago and has not been able to walk without a walker since. Symptoms began 9 days ago reported by family. Patient also ; currently being treated for UTI. FINDINGS: BRAIN AND VENTRICLES: No acute intracranial hemorrhage. No evidence of acute infarct. No hydrocephalus. No extra-axial collection. No mass effect or midline shift. Similar appearance of prominent mineralization of the occipital  lobes, right greater than left, with additional mineralization extending into the inferior  aspect of the parietal lobes. Similar mild mineralization involving the basal ganglia. Mild mineralization along the bilateral precentral gyri. Similar mineralization of the hippocampi. Extensive chronic microvascular ischemic changes. Similar parenchymal volume loss. Atherosclerosis of the carotid siphons. Minimal limitations of motion artifact and streak artifact. ORBITS: Bilateral lens replacement. SINUSES: Mucosal thickening in the right maxillary sinus. SOFT TISSUES AND SKULL: Subgaleal on the right. No significantly increased areas of mineralization. No skull fracture. IMPRESSION: 1. No acute intracranial abnormality. 2. Extensive chronic microvascular ischemic changes. 3. Similar parenchymal mineralization involving the parieto-occipital lobes (right greater than left), basal ganglia, precentral gyri, and hippocampi. 4. Right maxillary sinus mucosal thickening. Electronically signed by: Donnice Mania MD 07/28/2024 03:53 PM EDT RP Workstation: [id number]     TODAY-DAY OF DISCHARGE:  Subjective:   Danielle Vaughan today has no headache,no chest abdominal pain,no new weakness tingling or numbness, feels much better wants to go home today.   Objective:   Blood pressure 106/68, pulse 71, temperature 97.8 F (36.6 C), temperature source Axillary, resp. rate (!) 21, height 5' 1 (1.549 m), weight 55 kg, SpO2 100%.  Intake/Output Summary (Last 24 hours) at 08/05/2024 0842 Last data filed at 08/04/2024 2024 Gross per 24 hour  Intake 50 ml  Output 500 ml  Net -450 ml   Filed Weights   08/03/24 0500 08/04/24 0442 08/05/24 0500  Weight: 54.3 kg 53.6 kg 55 kg    Exam: Awake Alert, Oriented *3, No new F.N deficits, Normal affect Industry.AT,PERRAL Supple Neck,No JVD, No cervical lymphadenopathy appriciated.  Symmetrical Chest wall movement, Good air movement bilaterally, CTAB RRR,No Gallops,Rubs or new Murmurs, No Parasternal Heave +ve B.Sounds, Abd Soft, Non tender, No organomegaly appriciated, No  rebound -guarding or rigidity. No Cyanosis, Clubbing or edema, No new Rash or bruise   PERTINENT RADIOLOGIC STUDIES: No results found.   PERTINENT LAB RESULTS: CBC: Recent Labs    08/03/24 0325 08/04/24 0221  WBC 8.1 8.5  HGB 9.2* 9.1*  HCT 26.7* 26.5*  PLT 672* 712*   CMET CMP     Component Value Date/Time   NA 132 (L) 08/04/2024 0221   K 4.0 08/04/2024 0221   CL 98 08/04/2024 0221   CO2 22 08/04/2024 0221   GLUCOSE 93 08/04/2024 0221   BUN 9 08/04/2024 0221   CREATININE 0.60 08/04/2024 0221   CALCIUM 7.9 (L) 08/04/2024 0221   PROT 6.7 07/28/2024 1528   ALBUMIN 3.0 (L) 07/28/2024 1528   AST 22 07/28/2024 1528   ALT 13 07/28/2024 1528   ALKPHOS 55 07/28/2024 1528   BILITOT 0.8 07/28/2024 1528   GFRNONAA >60 08/04/2024 0221    GFR Estimated Creatinine Clearance: 36 mL/min (by C-G formula based on SCr of 0.6 mg/dL). No results for input(s): LIPASE, AMYLASE in the last 72 hours. No results for input(s): CKTOTAL, CKMB, CKMBINDEX, TROPONINI in the last 72 hours. Invalid input(s): POCBNP No results for input(s): DDIMER in the last 72 hours. No results for input(s): HGBA1C in the last 72 hours. No results for input(s): CHOL, HDL, LDLCALC, TRIG, CHOLHDL, LDLDIRECT in the last 72 hours. No results for input(s): TSH, T4TOTAL, T3FREE, THYROIDAB in the last 72 hours.  Invalid input(s): FREET3 No results for input(s): VITAMINB12, FOLATE, FERRITIN, TIBC, IRON, RETICCTPCT in the last 72 hours. Coags: No results for input(s): INR in the last 72 hours.  Invalid input(s): PT Microbiology: Recent Results (from the past 240 hours)  MRSA Next Gen  by PCR, Nasal     Status: None   Collection Time: 07/28/24  7:02 PM   Specimen: Nasal Mucosa; Nasal Swab  Result Value Ref Range Status   MRSA by PCR Next Gen NOT DETECTED NOT DETECTED Final    Comment: (NOTE) The GeneXpert MRSA Assay (FDA approved for NASAL specimens  only), is one component of a comprehensive MRSA colonization surveillance program. It is not intended to diagnose MRSA infection nor to guide or monitor treatment for MRSA infections. Test performance is not FDA approved in patients less than 61 years old. Performed at [facility], [address], [place], [place] [address]   Culture, blood (Routine X 2) w Reflex to ID Panel     Status: None   Collection Time: 07/28/24  8:25 PM   Specimen: BLOOD LEFT FOREARM  Result Value Ref Range Status   Specimen Description BLOOD LEFT FOREARM  Final   Special Requests   Final    BOTTLES DRAWN AEROBIC AND ANAEROBIC Blood Culture results may not be optimal due to an inadequate volume of blood received in culture bottles   Culture   Final    NO GROWTH 5 DAYS Performed at [facility], [address], [place], [place] [address]    Report Status 08/02/2024 FINAL  Final  Culture, blood (Routine X 2) w Reflex to ID Panel     Status: None   Collection Time: 07/28/24  8:35 PM   Specimen: BLOOD  Result Value Ref Range Status   Specimen Description BLOOD LEFT ANTECUBITAL  Final   Special Requests   Final    BOTTLES DRAWN AEROBIC AND ANAEROBIC Blood Culture results may not be optimal due to an inadequate volume of blood received in culture bottles   Culture   Final    NO GROWTH 5 DAYS Performed at [facility], [address] [place], [place] [address]    Report Status 08/02/2024 FINAL  Final    FURTHER DISCHARGE INSTRUCTIONS:  Get Medicines reviewed and adjusted: Please take all your medications with you for your next visit with your Primary MD  Laboratory/radiological data: Please request your Primary MD to go over all hospital tests and procedure/radiological results at the follow up, please ask your Primary MD to get all Hospital records sent to his/her office.  In some cases, they will be blood work, cultures and biopsy results pending at the time of your discharge. Please  request that your primary care M.D. goes through all the records of your hospital data and follows up on these results.  Also Note the following: If you experience worsening of your admission symptoms, develop shortness of breath, life threatening emergency, suicidal or homicidal thoughts you must seek medical attention immediately by calling 911 or calling your MD immediately  if symptoms less severe.  You must read complete instructions/literature along with all the possible adverse reactions/side effects for all the Medicines you take and that have been prescribed to you. Take any new Medicines after you have completely understood and accpet all the possible adverse reactions/side effects.   Do not drive when taking Pain medications or sleeping medications (Benzodaizepines)  Do not take more than prescribed Pain, Sleep and Anxiety Medications. It is not advisable to combine anxiety,sleep and pain medications without talking with your primary care practitioner  Special Instructions: If you have smoked or chewed Tobacco  in the last 2 yrs please stop smoking, stop any regular Alcohol  and or any Recreational  drug use.  Wear Seat belts while driving.  Please note: You were cared for by a hospitalist during your hospital stay. Once you are discharged, your primary care physician will handle any further medical issues. Please note that NO REFILLS for any discharge medications will be authorized once you are discharged, as it is imperative that you return to your primary care physician (or establish a relationship with a primary care physician if you do not have one) for your post hospital discharge needs so that they can reassess your need for medications and monitor your lab values.  Total Time spent coordinating discharge including counseling, education and face to face time equals greater than 30 minutes.  SignedBETHA Donalda Applebaum 08/05/2024 8:42 AM

## 2024-08-05 NOTE — TOC Progression Note (Addendum)
 Transition of Care [facility]) - Progression Note    Patient Details  Name: Danielle Vaughan MRN: [id number] Date of Birth: Mar 19, 1933  [facility] CM/SW Contact  Inocente GORMAN Kindle, LCSW Phone Number: 08/05/2024, 9:32 AM  Clinical Narrative:    9:32 AM-Left vm for daughter to obtain SNF choice for discharge today.  10:35 AM-CSW spoke with patient's daughter. She has selected [facility]. CSW updated facility and sent DC Summary. Daughter reported agreement with [facility] for transport.   11am-Daughter called back and asked CSW to hold off on discharge while she confers with her brother.  12:11 PM-Daughter returned call and stated to proceed with discharge to [facility].    Expected Discharge Plan: Skilled Nursing Facility Barriers to Discharge: Barriers resolved               Expected Discharge Plan and Services In-house Referral: Clinical Social Work   Post Acute Care Choice: Skilled Nursing Facility Living arrangements for the past 2 months: Single Family Home Expected Discharge Date: 08/05/24                                     Social Drivers of Health (SDOH) Interventions SDOH Screenings   Food Insecurity: No Food Insecurity (07/29/2024)  Housing: Low Risk  (07/29/2024)  Recent Concern: Housing - High Risk (07/29/2024)  Transportation Needs: No Transportation Needs (07/29/2024)  Utilities: Not At Risk (07/29/2024)  Alcohol Screen: Low Risk  (07/29/2024)  Depression (PHQ2-9): Low Risk  (07/29/2024)  Financial Resource Strain: Low Risk  (07/29/2024)  Physical Activity: Inactive (07/29/2024)  Social Connections: Moderately Integrated (07/29/2024)  Stress: No Stress Concern Present (07/29/2024)  Tobacco Use: Low Risk  (07/29/2024)  Health Literacy: Inadequate Health Literacy (07/29/2024)    Readmission Risk Interventions     No data to display

## 2024-08-09 DIAGNOSIS — L89323 Pressure ulcer of left buttock, stage 3: Secondary | ICD-10-CM | POA: Diagnosis not present

## 2024-08-09 DIAGNOSIS — L89310 Pressure ulcer of right buttock, unstageable: Secondary | ICD-10-CM | POA: Diagnosis not present

## 2024-08-09 DIAGNOSIS — E871 Hypo-osmolality and hyponatremia: Secondary | ICD-10-CM | POA: Diagnosis not present

## 2024-08-09 DIAGNOSIS — E785 Hyperlipidemia, unspecified: Secondary | ICD-10-CM | POA: Diagnosis not present

## 2024-08-11 NOTE — Progress Notes (Unsigned)
 Cardiology Office Note    Date:  08/13/2024  ID:  ALASIA ENGE, DOB 09-29-1933, MRN [id number] PCP:  [name], MD  Cardiologist:  Shelda Bruckner, MD  Electrophysiologist:  None   Chief Complaint: Follow up for HFrEF   History of Present Illness: .   MARYNELL BIES is a 89 y.o. female with visit-pertinent history of CVA, hypertension, hyperlipidemia.  Patient was first seen by Dr. Bruckner in 2021 as a new consult for evaluation and management poststroke.  Patient with history of intermittent blurry vision, right hand weakness since 06/2019.  She had CT head for the symptoms which noted generalized atrophy, supra tentorial small vessel disease and subacute right parieto-occipital PCA infarct.  ZIO monitor in 2021 worn for 14 days indicated an average heart rate of 62 bpm ranging from 40 to 238 bpm, 1 run of NSVT of 4 beats with an average rate of 115 bpm, 41 PSVT events, fastest 8 beats at 138 bpm, longest 13 beats at 106 bpm.  PVC burden 15.8%.  Rare atrial ectopy.  Echocardiogram on 12/28/2019 indicated LVEF 55 to 60%, LV with normal function, no RWMA, moderate asymmetric LVH of the basal septal segment, grade 2 diastolic dysfunction, elevated left atrial pressure, RV systolic function and size was normal, moderately elevated PASP, mild mitral valve regurgitation.  On 07/28/2024 patient presented to the hospital with strokelike symptoms, found to have hyponatremia, acute embolic CVA and acute pulmonary embolism.  Patient's sodium level found to be 119, per family had noted that she had been experiencing increased altered mental status in the days prior to admission.  Brain MRI revealed multiple small embolic infarcts involving parieto-occipital lobes as well as the frontal lobe on the left.  Per neurology notes review of telemetry strips indicated episodes of atrial flutter  Echocardiogram indicated EF 40 to 45%, mild LVH with grade 1 diastolic dysfunction, moderate TV  regurgitation.  The inferior wall, mid anteroseptal segment, basal inferior lateral segment, mid inferior septal segment and basal inferior septal subsequent were akinetic.  Today she presents for hospital follow up with her son and daughter. They note concerns as she has been sleeping during the day. She was discharged to a skilled nursing facility following her hospitalization, in speaking with family sounds as though she has been having some episodes of sundowning.  Patient is a poor historian and is unable to provide history, in discussion with patient family does not appear that she has reported any cardiac concerns.  Reviewed patient's echocardiogram with family as well as reports of episodes of atrial flutter while inpatient.  All questions were answered. ROS: .   Patient unable to provide history, patient family denies any reports of chest pain, shortness of breath,  palpitations, melena, hematuria, hemoptysis, diaphoresis, weakness, presyncope, syncope, orthopnea, and PND.  Studies Reviewed: SABRA   EKG:  EKG is ordered today, personally reviewed, demonstrating  EKG Interpretation Date/Time:  Friday August 13 2024 10:01:38 EST Ventricular Rate:  82 PR Interval:  136 QRS Duration:  116 QT Interval:  414 QTC Calculation: 483 R Axis:   -51  Text Interpretation: Normal sinus rhythm Left axis deviation Minimal voltage criteria for LVH, may be normal variant ( Cornell product ) Left bundle branch block When compared with ECG of 28-Jul-2024 15:22, Premature ventricular complexes are no longer Present Confirmed by Lavana Huckeba [id number]) on 08/13/2024 4:37:02 PM   CV Studies: Cardiac studies reviewed are outlined and summarized above. Otherwise please see EMR for full report. Cardiac Studies &  Procedures   ______________________________________________________________________________________________     ECHOCARDIOGRAM  ECHOCARDIOGRAM COMPLETE 08/01/2024  Narrative ECHOCARDIOGRAM  REPORT    Patient Name:   AMANDA STEUART Date of Exam: 08/01/2024 Medical Rec #:  [id number]        Height:       61.0 in Accession #:    [id number]       Weight:       120.1 lb Date of Birth:  1933-05-12        BSA:          1.521 m Patient Age:    89 years         BP:           108/69 mmHg Patient Gender: F                HR:           87 bpm. Exam Location:  Inpatient  Procedure: 2D Echo, 3D Echo, Cardiac Doppler and Color Doppler (Both Spectral and Color Flow Doppler were utilized during procedure).  Indications:    Stroke  History:        Patient has prior history of Echocardiogram examinations, most recent 12/28/2019.  Sonographer:    Philomena Daring Referring Phys: ANIUS.BANISTER MCNEILL P KIRKPATRICK   Sonographer Comments: Image acquisition challenging due to respiratory motion. IMPRESSIONS   1. Left ventricular ejection fraction, by estimation, is 40 to 45%. Left ventricular ejection fraction by 3D volume is 41 %. The left ventricle has mildly decreased function. The left ventricle demonstrates regional wall motion abnormalities (see scoring diagram/findings for description). There is mild left ventricular hypertrophy of the basal-septal segment. Left ventricular diastolic parameters are consistent with Grade I diastolic dysfunction (impaired relaxation). 2. Right ventricular systolic function is normal. The right ventricular size is normal. There is mildly elevated pulmonary artery systolic pressure. The estimated right ventricular systolic pressure is 45.0 mmHg. 3. The mitral valve is normal in structure. Trivial mitral valve regurgitation. No evidence of mitral stenosis. 4. Tricuspid valve regurgitation is moderate. 5. The aortic valve is normal in structure. Aortic valve regurgitation is not visualized. No aortic stenosis is present. 6. The inferior vena cava is normal in size with greater than 50% respiratory variability, suggesting right atrial pressure of 3  mmHg.  Conclusion(s)/Recommendation(s): No intracardiac source of embolism detected on this transthoracic study. Consider a transesophageal echocardiogram to exclude cardiac source of embolism if clinically indicated.  FINDINGS Left Ventricle: Left ventricular ejection fraction, by estimation, is 40 to 45%. Left ventricular ejection fraction by 3D volume is 41 %. The left ventricle has mildly decreased function. The left ventricle demonstrates regional wall motion abnormalities. The left ventricular internal cavity size was normal in size. There is mild left ventricular hypertrophy of the basal-septal segment. Left ventricular diastolic parameters are consistent with Grade I diastolic dysfunction (impaired relaxation).   LV Wall Scoring: The inferior wall, mid anteroseptal segment, basal inferolateral segment, mid inferoseptal segment, and basal inferoseptal segment are akinetic.  Right Ventricle: The right ventricular size is normal. No increase in right ventricular wall thickness. Right ventricular systolic function is normal. There is mildly elevated pulmonary artery systolic pressure. The tricuspid regurgitant velocity is 3.04 m/s, and with an assumed right atrial pressure of 8 mmHg, the estimated right ventricular systolic pressure is 45.0 mmHg.  Left Atrium: Left atrial size was normal in size.  Right Atrium: Right atrial size was normal in size.  Pericardium: There is no evidence of pericardial effusion.  Mitral  Valve: The mitral valve is normal in structure. Trivial mitral valve regurgitation. No evidence of mitral valve stenosis.  Tricuspid Valve: The tricuspid valve is normal in structure. Tricuspid valve regurgitation is moderate . No evidence of tricuspid stenosis.  Aortic Valve: The aortic valve is normal in structure. Aortic valve regurgitation is not visualized. No aortic stenosis is present.  Pulmonic Valve: The pulmonic valve was normal in structure. Pulmonic valve  regurgitation is not visualized. No evidence of pulmonic stenosis.  Aorta: The aortic root is normal in size and structure.  Venous: The inferior vena cava is normal in size with greater than 50% respiratory variability, suggesting right atrial pressure of 3 mmHg.  IAS/Shunts: No atrial level shunt detected by color flow Doppler.   LEFT VENTRICLE PLAX 2D LVIDd:         3.40 cm LVIDs:         2.70 cm LV PW:         1.10 cm         3D Volume EF LV IVS:        1.20 cm         LV 3D EF:    Left LVOT diam:     2.00 cm                      ventricul LV SV:         52                           ar LV SV Index:   34                           ejection LVOT Area:     3.14 cm                     fraction by 3D volume is 41 %.  3D Volume EF: 3D EF:        41 % LV EDV:       125 ml LV ESV:       74 ml LV SV:        51 ml  RIGHT VENTRICLE             IVC RV Basal diam:  3.30 cm     IVC diam: 2.10 cm RV Mid diam:    2.60 cm RV S prime:     17.40 cm/s TAPSE (M-mode): 2.3 cm  LEFT ATRIUM             Index        RIGHT ATRIUM           Index LA diam:        2.70 cm 1.78 cm/m   RA Area:     11.20 cm LA Vol (A2C):   42.5 ml 27.94 ml/m  RA Volume:   23.40 ml  15.38 ml/m LA Vol (A4C):   38.8 ml 25.51 ml/m LA Biplane Vol: 41.1 ml 27.02 ml/m AORTIC VALVE LVOT Vmax:   115.00 cm/s LVOT Vmean:  76.700 cm/s LVOT VTI:    0.164 m  AORTA Ao Root diam: 2.50 cm Ao Asc diam:  3.10 cm  TRICUSPID VALVE TR Peak grad:   37.0 mmHg TR Vmax:        304.00 cm/s  SHUNTS Systemic VTI:  0.16 m Systemic Diam: 2.00 cm  Oneil Parchment MD  Electronically signed by Oneil Parchment MD Signature Date/Time: 08/01/2024/1:11:32 PM    Final    MONITORS  LONG TERM MONITOR (3-14 DAYS) 12/06/2019       ______________________________________________________________________________________________       Current Reported Medications:.    Current Meds  Medication Sig   acetaminophen  (TYLENOL ) 500 MG  tablet Take 500-1,000 mg by mouth every 6 (six) hours as needed for moderate pain (pain score 4-6).   apixaban (ELIQUIS) 5 MG TABS tablet Take 1 tablet (5 mg total) by mouth 2 (two) times daily.   cyanocobalamin  (VITAMIN B12) 1000 MCG/ML injection Inject 1 mL (1,000 mcg total) into the skin every 30 (thirty) days.   feeding supplement (ENSURE PLUS HIGH PROTEIN) LIQD Take 237 mLs by mouth 2 (two) times daily between meals.   ketoconazole (NIZORAL) 2 % cream Apply 1 Application topically 2 (two) times daily.   Menthol, Topical Analgesic, (BIOFREEZE EX) Apply 1 Application topically as needed (Pain).   metoprolol tartrate (LOPRESSOR) 25 MG tablet Take 0.5 tablets (12.5 mg total) by mouth 2 (two) times daily.   midodrine (PROAMATINE) 10 MG tablet Take 1 tablet (10 mg total) by mouth 3 (three) times daily with meals.   multivitamin (PROSIGHT) TABS tablet Take 1 tablet by mouth 2 (two) times daily.   pantoprazole (PROTONIX) 40 MG tablet Take 1 tablet (40 mg total) by mouth daily.   polyethylene glycol (MIRALAX / GLYCOLAX) 17 g packet Take 17 g by mouth daily as needed for moderate constipation.   sodium chloride 1 g tablet Take 1 tablet (1 g total) by mouth 2 (two) times daily with a meal.   Physical Exam:    VS:  BP 110/66   Pulse 82   Ht 5' 1 (1.549 m)   Wt 115 lb (52.2 kg)   SpO2 99%   BMI 21.73 kg/m    Wt Readings from Last 3 Encounters:  08/13/24 115 lb (52.2 kg)  08/05/24 121 lb 4.1 oz (55 kg)  12/12/23 123 lb 14.4 oz (56.2 kg)    GEN: Well nourished, well developed in no acute distress NECK: No JVD; No carotid bruits CARDIAC: RRR, no murmurs, rubs, gallops RESPIRATORY:  Clear to auscultation without rales, wheezing or rhonchi  ABDOMEN: Soft, non-tender, non-distended EXTREMITIES:  No edema; No acute deformity     Asessement and Plan:.    Hx of CVA: Patient with history of CVA, admitted in 06/2024 with strokelike symptoms, found to have hyponatremia, acute embolic CVA and acute  PE.  Patient with hypotension and episodes of atrial flutter during admission.  Now on Eliquis and midodrine, to follow with neurology.   HFrEF: Echo during admission indicated EF 40 to 45%, mild LVH with grade 1 diastolic dysfunction, moderate TV regurgitation.  The inferior wall, mid anteroseptal segment, basal inferior lateral segment, mid inferior septal segment and basal inferior septal subsequent were akinetic.  Today she appears euvolemic and well compensated on exam.  Reviewed echo results with Dr. Parchment, DOD today, given patient's advanced dementia and recent stroke will not pursue workup at this time, discussed with patient family, they are in agreement.  Given ongoing hypotension requiring midodrine 3 times daily unable to escalate GDMT.  Patient mostly nonambulatory, not candidate for SGLT2 inhibitor.  Reviewed ED precautions.  Continue metoprolol tartrate 12.5 mg twice daily.  Atrial flutter/fibrillation/pulmonary embolism: Per notes patient with episodes of atrial fibrillation during admission for CVA, started on Eliquis and metoprolol tartrate 12.5 mg twice daily. Patient is a poor historian and  with advanced dementia would be unlikely to keep a monitor in place.  She is now living at [facility] with monitoring of vital signs.  On discussion with patient's family we will not refer to A-fib clinic at this time and will not place cardiac monitor, patient in agreement with plan.  Continue Eliquis 5 mg twice daily (PE dosing) and metoprolol tartrate 12.5 mg twice daily.  Hypotension: Blood pressure today 110/66. On midodrine 10 mg three times daily.   Nonsustained ventricular tachycardia, paroxysmal SVT, frequent PVCs per monitor: Previously noted on cardiac monitor in 2021. PVCs not appreciated on EKG today.  Continue metoprolol tartrate 12.5 mg twice daily.  Hyperlipidemia: Last lipid profile on 08/01/24 indicated total cholesterol 61, triglycerides 45, HDL 31 and LDL 21.  Monitored and  managed per PCP.  Hyponatremia: Patient presented with critically low sodium levels, suspicion for SIADH.  Patient with fluid restriction and salt tablets.  Patient's family reports that her facility checked lab work earlier this week.  To follow-up with PCP.   Disposition: F/u with Dr. Lonni in three months or sooner if needed.   Signed, Kaliope Quinonez D Lafe Clerk, NP

## 2024-08-13 ENCOUNTER — Ambulatory Visit: Attending: Cardiology | Admitting: Cardiology

## 2024-08-13 ENCOUNTER — Encounter: Payer: Self-pay | Admitting: Cardiology

## 2024-08-13 VITALS — BP 110/66 | HR 82 | Ht 61.0 in | Wt 115.0 lb

## 2024-08-13 DIAGNOSIS — I493 Ventricular premature depolarization: Secondary | ICD-10-CM | POA: Diagnosis not present

## 2024-08-13 DIAGNOSIS — I10 Essential (primary) hypertension: Secondary | ICD-10-CM

## 2024-08-13 DIAGNOSIS — Z8673 Personal history of transient ischemic attack (TIA), and cerebral infarction without residual deficits: Secondary | ICD-10-CM | POA: Insufficient documentation

## 2024-08-13 DIAGNOSIS — I480 Paroxysmal atrial fibrillation: Secondary | ICD-10-CM | POA: Diagnosis not present

## 2024-08-13 DIAGNOSIS — I959 Hypotension, unspecified: Secondary | ICD-10-CM | POA: Insufficient documentation

## 2024-08-13 DIAGNOSIS — I5020 Unspecified systolic (congestive) heart failure: Secondary | ICD-10-CM | POA: Diagnosis not present

## 2024-08-13 NOTE — Patient Instructions (Signed)
 Medication Instructions:  Your physician recommends that you continue on your current medications as directed. Please refer to the Current Medication list given to you today.  *If you need a refill on your cardiac medications before your next appointment, please call your pharmacy*  Lab Work: NONE If you have labs (blood work) drawn today and your tests are completely normal, you will receive your results only by: MyChart Message (if you have MyChart) OR A paper copy in the mail If you have any lab test that is abnormal or we need to change your treatment, we will call you to review the results.  Testing/Procedures: NONE  Follow-Up: At [facility], you and your health needs are our priority.  As part of our continuing mission to provide you with exceptional heart care, our providers are all part of one team.  This team includes your primary Cardiologist (physician) and Advanced Practice Providers or APPs (Physician Assistants and Nurse Practitioners) who all work together to provide you with the care you need, when you need it.  Your next appointment:   3 month(s)  Provider:   Shelda Bruckner, MD   We recommend signing up for the patient portal called MyChart.  Sign up information is provided on this After Visit Summary.  MyChart is used to connect with patients for Virtual Visits (Telemedicine).  Patients are able to view lab/test results, encounter notes, upcoming appointments, etc.  Non-urgent messages can be sent to your provider as well.   To learn more about what you can do with MyChart, go to [email or web address].

## 2024-08-17 DIAGNOSIS — E871 Hypo-osmolality and hyponatremia: Secondary | ICD-10-CM | POA: Diagnosis not present

## 2024-08-17 DIAGNOSIS — L89310 Pressure ulcer of right buttock, unstageable: Secondary | ICD-10-CM | POA: Diagnosis not present

## 2024-08-17 DIAGNOSIS — E785 Hyperlipidemia, unspecified: Secondary | ICD-10-CM | POA: Diagnosis not present

## 2024-08-17 DIAGNOSIS — L89323 Pressure ulcer of left buttock, stage 3: Secondary | ICD-10-CM | POA: Diagnosis not present

## 2024-08-24 DIAGNOSIS — L89310 Pressure ulcer of right buttock, unstageable: Secondary | ICD-10-CM | POA: Diagnosis not present

## 2024-08-24 DIAGNOSIS — E871 Hypo-osmolality and hyponatremia: Secondary | ICD-10-CM | POA: Diagnosis not present

## 2024-08-24 DIAGNOSIS — L89323 Pressure ulcer of left buttock, stage 3: Secondary | ICD-10-CM | POA: Diagnosis not present

## 2024-08-24 DIAGNOSIS — E785 Hyperlipidemia, unspecified: Secondary | ICD-10-CM | POA: Diagnosis not present

## 2024-08-31 DIAGNOSIS — L89310 Pressure ulcer of right buttock, unstageable: Secondary | ICD-10-CM | POA: Diagnosis not present

## 2024-08-31 DIAGNOSIS — E785 Hyperlipidemia, unspecified: Secondary | ICD-10-CM | POA: Diagnosis not present

## 2024-08-31 DIAGNOSIS — L89323 Pressure ulcer of left buttock, stage 3: Secondary | ICD-10-CM | POA: Diagnosis not present

## 2024-08-31 DIAGNOSIS — E871 Hypo-osmolality and hyponatremia: Secondary | ICD-10-CM | POA: Diagnosis not present

## 2024-09-02 DIAGNOSIS — E785 Hyperlipidemia, unspecified: Secondary | ICD-10-CM | POA: Diagnosis not present

## 2024-09-02 DIAGNOSIS — L89323 Pressure ulcer of left buttock, stage 3: Secondary | ICD-10-CM | POA: Diagnosis not present

## 2024-09-02 DIAGNOSIS — L89310 Pressure ulcer of right buttock, unstageable: Secondary | ICD-10-CM | POA: Diagnosis not present

## 2024-09-02 DIAGNOSIS — E871 Hypo-osmolality and hyponatremia: Secondary | ICD-10-CM | POA: Diagnosis not present

## 2024-09-05 DIAGNOSIS — L98491 Non-pressure chronic ulcer of skin of other sites limited to breakdown of skin: Secondary | ICD-10-CM | POA: Diagnosis not present

## 2024-09-07 DIAGNOSIS — L89526 Pressure-induced deep tissue damage of left ankle: Secondary | ICD-10-CM | POA: Diagnosis not present

## 2024-09-07 DIAGNOSIS — E785 Hyperlipidemia, unspecified: Secondary | ICD-10-CM | POA: Diagnosis not present

## 2024-09-07 DIAGNOSIS — E871 Hypo-osmolality and hyponatremia: Secondary | ICD-10-CM | POA: Diagnosis not present

## 2024-09-07 DIAGNOSIS — L89313 Pressure ulcer of right buttock, stage 3: Secondary | ICD-10-CM | POA: Diagnosis not present

## 2024-10-13 ENCOUNTER — Other Ambulatory Visit: Payer: Self-pay

## 2024-10-13 DIAGNOSIS — I739 Peripheral vascular disease, unspecified: Secondary | ICD-10-CM

## 2024-11-02 ENCOUNTER — Ambulatory Visit (HOSPITAL_BASED_OUTPATIENT_CLINIC_OR_DEPARTMENT_OTHER): Admitting: Cardiology

## 2024-11-03 ENCOUNTER — Ambulatory Visit: Admitting: Vascular Surgery

## 2024-11-03 ENCOUNTER — Ambulatory Visit (HOSPITAL_COMMUNITY): Admission: RE | Admit: 2024-11-03 | Source: Ambulatory Visit

## 2024-11-04 ENCOUNTER — Inpatient Hospital Stay: Admitting: Neurology

## 2024-11-04 ENCOUNTER — Encounter: Payer: Self-pay | Admitting: Neurology

## 2024-11-05 ENCOUNTER — Ambulatory Visit (HOSPITAL_BASED_OUTPATIENT_CLINIC_OR_DEPARTMENT_OTHER): Admitting: Cardiology

## 2025-03-15 ENCOUNTER — Ambulatory Visit: Admitting: Neurology
